# Patient Record
Sex: Female | Born: 1965 | Race: Black or African American | Hispanic: No | Marital: Single | Smoking: Never smoker
Health system: Southern US, Community
[De-identification: ages and names within clinical notes are randomized; demographics above are authoritative.]

## PROBLEM LIST (undated history)

## (undated) DIAGNOSIS — E785 Hyperlipidemia, unspecified: Secondary | ICD-10-CM

## (undated) DIAGNOSIS — I10 Essential (primary) hypertension: Secondary | ICD-10-CM

---

## 2010-10-21 ENCOUNTER — Emergency Department (HOSPITAL_COMMUNITY)
Admission: EM | Admit: 2010-10-21 | Discharge: 2010-10-21 | Payer: Self-pay | Source: Home / Self Care | Admitting: Emergency Medicine

## 2011-02-09 ENCOUNTER — Ambulatory Visit: Payer: Self-pay | Admitting: Family Medicine

## 2013-08-01 ENCOUNTER — Encounter (HOSPITAL_COMMUNITY): Payer: Self-pay | Admitting: Emergency Medicine

## 2013-08-01 DIAGNOSIS — Q619 Cystic kidney disease, unspecified: Secondary | ICD-10-CM

## 2013-08-01 DIAGNOSIS — R000 Tachycardia, unspecified: Secondary | ICD-10-CM | POA: Diagnosis present

## 2013-08-01 DIAGNOSIS — A419 Sepsis, unspecified organism: Secondary | ICD-10-CM | POA: Diagnosis present

## 2013-08-01 DIAGNOSIS — K5900 Constipation, unspecified: Secondary | ICD-10-CM | POA: Diagnosis present

## 2013-08-01 DIAGNOSIS — E860 Dehydration: Secondary | ICD-10-CM | POA: Diagnosis present

## 2013-08-01 DIAGNOSIS — N12 Tubulo-interstitial nephritis, not specified as acute or chronic: Secondary | ICD-10-CM | POA: Diagnosis present

## 2013-08-01 DIAGNOSIS — E876 Hypokalemia: Secondary | ICD-10-CM | POA: Diagnosis present

## 2013-08-01 DIAGNOSIS — N289 Disorder of kidney and ureter, unspecified: Secondary | ICD-10-CM | POA: Diagnosis present

## 2013-08-01 DIAGNOSIS — Z79899 Other long term (current) drug therapy: Secondary | ICD-10-CM

## 2013-08-01 DIAGNOSIS — A4151 Sepsis due to Escherichia coli [E. coli]: Principal | ICD-10-CM | POA: Diagnosis present

## 2013-08-01 NOTE — ED Notes (Signed)
Daughter reports generalized weakness / poor appetite onset yesterday , tachycardic at triage , denies pain / respirations unlabored.

## 2013-08-02 ENCOUNTER — Inpatient Hospital Stay (HOSPITAL_COMMUNITY)
Admission: EM | Admit: 2013-08-02 | Discharge: 2013-08-04 | DRG: 872 | Disposition: A | Discharge status: Home / Self Care | Payer: Self-pay | Attending: Internal Medicine | Admitting: Internal Medicine

## 2013-08-02 ENCOUNTER — Encounter (HOSPITAL_COMMUNITY): Payer: Self-pay | Admitting: Radiology

## 2013-08-02 ENCOUNTER — Inpatient Hospital Stay (HOSPITAL_COMMUNITY): Payer: Self-pay

## 2013-08-02 ENCOUNTER — Emergency Department (HOSPITAL_COMMUNITY): Payer: Self-pay

## 2013-08-02 DIAGNOSIS — A419 Sepsis, unspecified organism: Secondary | ICD-10-CM | POA: Diagnosis present

## 2013-08-02 DIAGNOSIS — R519 Headache, unspecified: Secondary | ICD-10-CM

## 2013-08-02 DIAGNOSIS — N12 Tubulo-interstitial nephritis, not specified as acute or chronic: Secondary | ICD-10-CM

## 2013-08-02 DIAGNOSIS — E876 Hypokalemia: Secondary | ICD-10-CM | POA: Diagnosis present

## 2013-08-02 DIAGNOSIS — R7881 Bacteremia: Secondary | ICD-10-CM | POA: Diagnosis present

## 2013-08-02 DIAGNOSIS — E860 Dehydration: Secondary | ICD-10-CM

## 2013-08-02 DIAGNOSIS — N390 Urinary tract infection, site not specified: Secondary | ICD-10-CM

## 2013-08-02 DIAGNOSIS — R51 Headache: Secondary | ICD-10-CM

## 2013-08-02 DIAGNOSIS — R410 Disorientation, unspecified: Secondary | ICD-10-CM

## 2013-08-02 HISTORY — DX: Hypokalemia: E87.6

## 2013-08-02 HISTORY — DX: Tubulo-interstitial nephritis, not specified as acute or chronic: N12

## 2013-08-02 LAB — COMPREHENSIVE METABOLIC PANEL
ALT: 24 U/L (ref 0–35)
AST: 28 U/L (ref 0–37)
Alkaline Phosphatase: 96 U/L (ref 39–117)
Calcium: 8.3 mg/dL — ABNORMAL LOW (ref 8.4–10.5)
GFR calc Af Amer: 67 mL/min — ABNORMAL LOW (ref >90)
Glucose, Bld: 141 mg/dL — ABNORMAL HIGH (ref 70–99)
Potassium: 2.9 mEq/L — ABNORMAL LOW (ref 3.5–5.1)
Sodium: 133 mEq/L — ABNORMAL LOW (ref 135–145)
Total Protein: 7.2 g/dL (ref 6.0–8.3)

## 2013-08-02 LAB — CBC WITH DIFFERENTIAL/PLATELET
Basophils Absolute: 0 10*3/uL (ref 0.0–0.1)
Eosinophils Absolute: 0 10*3/uL (ref 0.0–0.7)
Eosinophils Relative: 0 % (ref 0–5)
Hemoglobin: 11.4 g/dL — ABNORMAL LOW (ref 12.0–15.0)
Lymphocytes Relative: 4 % — ABNORMAL LOW (ref 12–46)
Lymphs Abs: 0.6 10*3/uL — ABNORMAL LOW (ref 0.7–4.0)
MCH: 27.5 pg (ref 26.0–34.0)
Monocytes Relative: 9 % (ref 3–12)
Neutrophils Relative %: 88 % — ABNORMAL HIGH (ref 43–77)
Platelets: 169 10*3/uL (ref 150–400)
RBC: 4.14 MIL/uL (ref 3.87–5.11)
WBC: 14.5 10*3/uL — ABNORMAL HIGH (ref 4.0–10.5)

## 2013-08-02 LAB — URINALYSIS, ROUTINE W REFLEX MICROSCOPIC
Glucose, UA: NEGATIVE mg/dL
Ketones, ur: 15 mg/dL — AB
Nitrite: NEGATIVE
Protein, ur: 100 mg/dL — AB
Specific Gravity, Urine: 1.008 (ref 1.005–1.030)
pH: 6 (ref 5.0–8.0)

## 2013-08-02 LAB — CG4 I-STAT (LACTIC ACID): Lactic Acid, Venous: 1.53 mmol/L (ref 0.5–2.2)

## 2013-08-02 LAB — PROTIME-INR: INR: 1.55 — ABNORMAL HIGH (ref 0.00–1.49)

## 2013-08-02 LAB — URINE MICROSCOPIC-ADD ON

## 2013-08-02 LAB — MAGNESIUM: Magnesium: 1.8 mg/dL (ref 1.5–2.5)

## 2013-08-02 LAB — POTASSIUM: Potassium: 4.4 mEq/L (ref 3.5–5.1)

## 2013-08-02 LAB — APTT: aPTT: 33 seconds (ref 24–37)

## 2013-08-02 LAB — CK: Total CK: 196 U/L — ABNORMAL HIGH (ref 7–177)

## 2013-08-02 MED ORDER — PNEUMOCOCCAL VAC POLYVALENT 25 MCG/0.5ML IJ INJ
0.5000 mL | INJECTION | INTRAMUSCULAR | Status: AC
  Administered 2013-08-03: 0.5 mL via INTRAMUSCULAR
  Filled 2013-08-02: qty 0.5

## 2013-08-02 MED ORDER — POTASSIUM CHLORIDE CRYS ER 20 MEQ PO TBCR
40.0000 meq | EXTENDED_RELEASE_TABLET | Freq: Once | ORAL | Status: AC
  Administered 2013-08-02: 40 meq via ORAL
  Filled 2013-08-02: qty 2

## 2013-08-02 MED ORDER — DEXTROSE 5 % IV SOLN: 2.0000 g | Freq: Once | INTRAVENOUS | Status: DC

## 2013-08-02 MED ORDER — POTASSIUM CHLORIDE IN NACL 20-0.9 MEQ/L-% IV SOLN
INTRAVENOUS | Status: AC
  Administered 2013-08-02: 10:00:00 via INTRAVENOUS
  Administered 2013-08-02: 100 mL/h via INTRAVENOUS
  Administered 2013-08-03: 05:00:00 via INTRAVENOUS
  Filled 2013-08-02 (×3): qty 1000

## 2013-08-02 MED ORDER — ONDANSETRON HCL 4 MG/2ML IJ SOLN: 4.0000 mg | Freq: Four times a day (QID) | INTRAMUSCULAR | Status: DC | PRN

## 2013-08-02 MED ORDER — DEXTROSE 5 % IV SOLN
1.0000 g | INTRAVENOUS | Status: DC
  Filled 2013-08-02: qty 10

## 2013-08-02 MED ORDER — ACETAMINOPHEN 650 MG RE SUPP: 650.0000 mg | Freq: Four times a day (QID) | RECTAL | Status: DC | PRN

## 2013-08-02 MED ORDER — ONDANSETRON HCL 4 MG/2ML IJ SOLN
4.0000 mg | Freq: Once | INTRAMUSCULAR | Status: AC
  Administered 2013-08-02: 4 mg via INTRAVENOUS
  Filled 2013-08-02: qty 2

## 2013-08-02 MED ORDER — MORPHINE SULFATE 2 MG/ML IJ SOLN
2.0000 mg | Freq: Once | INTRAMUSCULAR | Status: AC
  Administered 2013-08-02: 2 mg via INTRAVENOUS
  Filled 2013-08-02: qty 1

## 2013-08-02 MED ORDER — INFLUENZA VAC SPLIT QUAD 0.5 ML IM SUSP
0.5000 mL | INTRAMUSCULAR | Status: AC
  Administered 2013-08-03: 0.5 mL via INTRAMUSCULAR
  Filled 2013-08-02: qty 0.5

## 2013-08-02 MED ORDER — ONDANSETRON HCL 4 MG PO TABS: 4.0000 mg | ORAL_TABLET | Freq: Four times a day (QID) | ORAL | Status: DC | PRN

## 2013-08-02 MED ORDER — DOCUSATE SODIUM 100 MG PO CAPS
200.0000 mg | ORAL_CAPSULE | Freq: Two times a day (BID) | ORAL | Status: DC
  Administered 2013-08-02 – 2013-08-04 (×4): 200 mg via ORAL
  Filled 2013-08-02 (×5): qty 2

## 2013-08-02 MED ORDER — POTASSIUM CHLORIDE 10 MEQ/100ML IV SOLN
10.0000 meq | INTRAVENOUS | Status: AC
  Administered 2013-08-02 (×2): 10 meq via INTRAVENOUS
  Filled 2013-08-02 (×2): qty 100

## 2013-08-02 MED ORDER — BISACODYL 5 MG PO TBEC
10.0000 mg | DELAYED_RELEASE_TABLET | Freq: Once | ORAL | Status: AC
  Administered 2013-08-02: 10 mg via ORAL
  Filled 2013-08-02: qty 2

## 2013-08-02 MED ORDER — SODIUM CHLORIDE 0.9 % IJ SOLN
3.0000 mL | Freq: Two times a day (BID) | INTRAMUSCULAR | Status: DC
  Administered 2013-08-03: 3 mL via INTRAVENOUS

## 2013-08-02 MED ORDER — SODIUM CHLORIDE 0.9 % IV BOLUS (SEPSIS)
2000.0000 mL | Freq: Once | INTRAVENOUS | Status: AC
  Administered 2013-08-02: 2000 mL via INTRAVENOUS

## 2013-08-02 MED ORDER — AMITRIPTYLINE HCL 50 MG PO TABS
50.0000 mg | ORAL_TABLET | Freq: Every day | ORAL | Status: DC
  Administered 2013-08-02 – 2013-08-03 (×2): 50 mg via ORAL
  Filled 2013-08-02 (×3): qty 1

## 2013-08-02 MED ORDER — ACETAMINOPHEN 325 MG PO TABS
650.0000 mg | ORAL_TABLET | Freq: Four times a day (QID) | ORAL | Status: DC | PRN
  Administered 2013-08-02 – 2013-08-03 (×4): 650 mg via ORAL
  Filled 2013-08-02 (×5): qty 2

## 2013-08-02 MED ORDER — ENOXAPARIN SODIUM 40 MG/0.4ML ~~LOC~~ SOLN
40.0000 mg | SUBCUTANEOUS | Status: DC
  Administered 2013-08-02 – 2013-08-04 (×3): 40 mg via SUBCUTANEOUS
  Filled 2013-08-02 (×4): qty 0.4

## 2013-08-02 NOTE — Care Management Note (Signed)
    Page 1 of 1   08/04/2013     10:34:31 AM   CARE MANAGEMENT NOTE 08/04/2013  Patient:  Chelsey Wilson, Chelsey Wilson   Account Number:  [id number]  Date Initiated:  08/02/2013  Documentation initiated by:  GRAVES-BIGELOW,Fryda Molenda  Subjective/Objective Assessment:   Pt admitted for acute pyelonephritis.     Action/Plan:   CM will contine to monitor for disposition needs.   Anticipated DC Date:  08/03/2013   Anticipated DC Plan:  HOME/SELF CARE      DC Planning Services  CM consult  Follow-up appt scheduled  PCP issues      Choice offered to / List presented to:             Status of service:  Completed, signed off Medicare Important Message given?   (If response is "NO", the following Medicare IM given date fields will be blank) Date Medicare IM given:   Date Additional Medicare IM given:    Discharge Disposition:  HOME/SELF CARE  Per UR Regulation:  Reviewed for med. necessity/level of care/duration of stay  If discussed at Long Length of Stay Meetings, dates discussed:    Comments:  08-04-13 [address], RN, BSN [phone] CM did speak to pt and an appointment was made at the [facility] off of [address]. CM did also set her up with an orange card appointment. CM will look at med list before pt is d/c.

## 2013-08-02 NOTE — Progress Notes (Signed)
UR Completed Tuyet Bader Graves-Bigelow, RN,BSN [phone]  

## 2013-08-02 NOTE — H&P (Signed)
[facility] History and Physical  Chelsey Wilson FAO:[id number] DOB: 09/12/1957 DOA: 08/02/2013  Referring physician: ER physician. PCP: No primary provider on file.   Chief Complaint: Not feeling well.  HPI: Chelsey Wilson is a 47 y.o. female With no significant past medical history presented to the ER because patient was not feeling well. As per the patient patient has been having generalized body ache and poor appetite with headache. Patient felt nauseous but did not vomit. Did not have any abdominal pain diarrhea. Denies any chest pain or shortness of breath. In the ER patient initially was found to be toxic but greatly improved with fluids and antibiotics. UA shows features of UTI. Patient at this time has been admitted for IV antibiotics for possible pyelonephritis.  Patient denies having traveled anywhere outside [place] or has come in contact with anyone traveled from outside the [place].  Review of Systems: As presented in the history of presenting illness, rest negative.  History reviewed. No pertinent past medical history. History reviewed. No pertinent past surgical history. Social History:  reports that she has never smoked. She does not have any smokeless tobacco history on file. She reports that she does not drink alcohol or use illicit drugs. Home. where does patient live-- Yes. Can patient participate in ADLs?  No Known Allergies  Family History  Problem Relation Age of Onset  . Other Neg Hx       Prior to Admission medications   Medication Sig Start Date End Date Taking? Authorizing Provider  amitriptyline (ELAVIL) 50 MG tablet Take 50 mg by mouth at bedtime.   Yes Historical Provider, MD  OVER THE COUNTER MEDICATION Take 2 tablets by mouth every 8 (eight) hours as needed. Store brand sinus and head ache relief   Yes Historical Provider, MD   Physical Exam: Filed Vitals:   08/01/13 2319 08/02/13 0330  BP: 121/76 102/69  Pulse: 154 115  Temp: 99.9  F (37.7 C)   TempSrc: Oral   Resp: 20 21  SpO2: 94% 100%     General:  Well developed and nourished.  Eyes: anicteric no pallor.  ENT: no discharge from ears nose or mouth.  Neck: no neck rigidity. No mass felt.  Cardiovascular: S1S2  Respiratory: no rhonchi or crepitations.  Abdomen: soft non tender.  Skin: no rash.  Musculoskeletal: no edema.  Psychiatric: Appears normal  Neurologic: Alert awake and oriented to time place and person.   Labs on Admission:  Basic Metabolic Panel:  Recent Labs Lab 08/02/13 0315  NA 133*  K 2.9*  CL 98  CO2 23  GLUCOSE 141*  BUN 11  CREATININE 1.05  CALCIUM 8.3*  MG 1.8   Liver Function Tests:  Recent Labs Lab 08/02/13 0315  AST 28  ALT 24  ALKPHOS 96  BILITOT 1.0  PROT 7.2  ALBUMIN 3.0*   No results found for this basename: LIPASE, AMYLASE,  in the last 168 hours No results found for this basename: AMMONIA,  in the last 168 hours CBC:  Recent Labs Lab 08/02/13 0315  WBC 14.5*  NEUTROABS 12.7*  HGB 11.4*  HCT 32.3*  MCV 78.0  PLT 169   Cardiac Enzymes:  Recent Labs Lab 08/02/13 0315  CKTOTAL 196*    BNP (last 3 results) No results found for this basename: PROBNP,  in the last 8760 hours CBG: No results found for this basename: GLUCAP,  in the last 168 hours  Radiological Exams on Admission: Dg Chest 2 View  08/02/2013   *RADIOLOGY REPORT*  Clinical Data: Chest pain.  CHEST - 2 VIEW  Comparison: None available at time of study interpretation.  Findings: The cardiac silhouette is mildly enlarged, mediastinal silhouette is unremarkable.  Mild chronic interstitial changes without pleural effusions or focal consolidations.  3.5 mm nodular densities seen in right lung base on the frontal radiograph only. Pulmonary vasculature is unremarkable.  Trachea projects midline and there is no pneumothorax.  Very mild anterior wedging of vertebral body T8.  Soft tissue planes are not suspicious.  IMPRESSION: Mild  cardiomegaly and mild chronic interstitial changes without superimposed acute pulmonary process.  3.5 mm nodular density in right lung base could reflect a parenchymal nodule or pulmonary vessel en face.  If clinically indicated, this would be better assessed on CT of chest.   Original Report Authenticated By: Awilda Metro   Ct Head Wo Contrast  08/02/2013   *RADIOLOGY REPORT*  Clinical Data: Weakness, anorexia.  CT HEAD WITHOUT CONTRAST  Technique:  Contiguous axial images were obtained from the base of the skull through the vertex without contrast.  Comparison: None available at time of study interpretation.  Findings: The ventricles and sulci are normal.  No intraparenchymal hemorrhage, mass effect nor midline shift.  No acute large vascular territory infarcts.  No abnormal extra-axial fluid collections.  Basal cisterns are patent.  Mild calcific atherosclerosis of the carotid siphons.  No skull fracture.  Visualized paranasal sinuses and mastoid aircells are well-aerated. 9 mm right frontal sinus osteoma, benign finding.  Moderate temporomandibular osteoarthrosis.  The included ocular globes and orbital contents are non-suspicious.  IMPRESSION: No acute intracranial process; normal noncontrast CT of the head for age.   Original Report Authenticated By: Awilda Metro    Assessment/Plan Principal Problem:   Pyelonephritis Active Problems:   Hypokalemia   1. Pyelonephritis - follow urine cultures. Continue ceftriaxone. Continue hydration. Checking renal sonogram to rule out hydronephrosis. 2. Hypokalemia - probably from poor oral intake. Replace and recheck. Check magnesium levels.    Code Status: Full code.  Family Communication: None.  Disposition Plan: Admit to inpatient.    Izola Teague N. [facility] Pager [phone].  If 7PM-7AM, please contact night-coverage [email or web address] Password [email or web address] 08/02/2013, 7:03 AM

## 2013-08-02 NOTE — ED Notes (Signed)
Report given to Tina, RN.  

## 2013-08-02 NOTE — ED Provider Notes (Signed)
CSN: [id number]     Arrival date & time 08/01/13  2301 History   None    Chief Complaint  Patient presents with  . Fatigue   (Consider location/radiation/quality/duration/timing/severity/associated sxs/prior Treatment) HPI  This patient is a generally healthy [place] American woman who presents with 2d of fever, headache and generalized weakness. She is here with her daughters who also note that the patient seemed disoriented PTA. Patient has not checked her temp at home. She says her sx have been increasingly severe. She feels so weak, at this time, that she doesn't feel like she can get up and walk across the room.  However, she denies any focal neurologic deficits  Her appetite and po intake have been diminished. She denies cough, URI sx, SOB, abdominal pain, N/V/D and dysuria. Her daughter reports that the patient was incontinent of urine once - approx 6 hr pta.   The patient has no known ill contact and no recent international travel.    History reviewed. No pertinent past medical history. History reviewed. No pertinent past surgical history. No family history on file. History  Substance Use Topics  . Smoking status: Never Smoker   . Smokeless tobacco: Not on file  . Alcohol Use: No   OB History   Grav Para Term Preterm Abortions TAB SAB Ect Mult Living                 Review of Systems 10 point ROS obtained and negative except as noted above.   Allergies  Review of patient's allergies indicates no known allergies.  Home Medications   Current Outpatient Rx  Name  Route  Sig  Dispense  Refill  . amitriptyline (ELAVIL) 50 MG tablet   Oral   Take 50 mg by mouth at bedtime.         Marland Kitchen OVER THE COUNTER MEDICATION   Oral   Take 2 tablets by mouth every 8 (eight) hours as needed. Store brand sinus and head ache relief          BP 121/76  Pulse 154  Temp(Src) 99.9 F (37.7 C) (Oral)  Resp 20  SpO2 94% Physical Exam Gen: well developed and well nourished  appearing, appears acutely ill, diaphoretic, generally weak appearing.  Head: NCAT Eyes: PERL, EOMI Nose: no epistaixis or rhinorrhea Mouth/throat: mucosa is mildly dehydrated appearing and pink, no lesions Neck: supple, no stridor, no rigidity, no LAN Lungs: CTA B, no wheezing, rhonchi or rales, RR 24-28/min Abd: soft, notender, nondistended Back: no ttp, no cva ttp, mild kyphosis Skin: no rash or petechia, warm, mild diaphoresis Neuro: CN ii-xii grossly intact, no focal deficits Psyche; flat affect,  calm and cooperative.  MSK: nontender with normal passive ROM all 4 ext  ED Course  Procedures (including critical care time)  EKG: sinus tach, rate 157 bpm, non specific ST-T abnormalities.   Results for orders placed during the hospital encounter of 08/02/13 (from the past 24 hour(s))  CBC WITH DIFFERENTIAL     Status: Abnormal   Collection Time    08/02/13  3:15 AM      Result Value Range   WBC 14.5 (*) 4.0 - 10.5 K/uL   RBC 4.14  3.87 - 5.11 MIL/uL   Hemoglobin 11.4 (*) 12.0 - 15.0 g/dL   HCT 19.1 (*) 47.8 - 29.5 %   MCV 78.0  78.0 - 100.0 fL   MCH 27.5  26.0 - 34.0 pg   MCHC 35.3  30.0 - 36.0 g/dL  RDW 14.3  11.5 - 15.5 %   Platelets 169  150 - 400 K/uL   Neutrophils Relative % 88 (*) 43 - 77 %   Neutro Abs 12.7 (*) 1.7 - 7.7 K/uL   Lymphocytes Relative 4 (*) 12 - 46 %   Lymphs Abs 0.6 (*) 0.7 - 4.0 K/uL   Monocytes Relative 9  3 - 12 %   Monocytes Absolute 1.2 (*) 0.1 - 1.0 K/uL   Eosinophils Relative 0  0 - 5 %   Eosinophils Absolute 0.0  0.0 - 0.7 K/uL   Basophils Relative 0  0 - 1 %   Basophils Absolute 0.0  0.0 - 0.1 K/uL  COMPREHENSIVE METABOLIC PANEL     Status: Abnormal   Collection Time    08/02/13  3:15 AM      Result Value Range   Sodium 133 (*) 135 - 145 mEq/L   Potassium 2.9 (*) 3.5 - 5.1 mEq/L   Chloride 98  96 - 112 mEq/L   CO2 23  19 - 32 mEq/L   Glucose, Bld 141 (*) 70 - 99 mg/dL   BUN 11  6 - 23 mg/dL   Creatinine, Ser 8.29  0.50 - 1.10 mg/dL    Calcium 8.3 (*) 8.4 - 10.5 mg/dL   Total Protein 7.2  6.0 - 8.3 g/dL   Albumin 3.0 (*) 3.5 - 5.2 g/dL   AST 28  0 - 37 U/L   ALT 24  0 - 35 U/L   Alkaline Phosphatase 96  39 - 117 U/L   Total Bilirubin 1.0  0.3 - 1.2 mg/dL   GFR calc non Af Amer 58 (*) >90 mL/min   GFR calc Af Amer 67 (*) >90 mL/min  PROTIME-INR     Status: Abnormal   Collection Time    08/02/13  3:15 AM      Result Value Range   Prothrombin Time 18.2 (*) 11.6 - 15.2 seconds   INR 1.55 (*) 0.00 - 1.49  CK     Status: Abnormal   Collection Time    08/02/13  3:15 AM      Result Value Range   Total CK 196 (*) 7 - 177 U/L  APTT     Status: None   Collection Time    08/02/13  3:15 AM      Result Value Range   aPTT 33  24 - 37 seconds  MAGNESIUM     Status: None   Collection Time    08/02/13  3:15 AM      Result Value Range   Magnesium 1.8  1.5 - 2.5 mg/dL  URINALYSIS, ROUTINE W REFLEX MICROSCOPIC     Status: Abnormal   Collection Time    08/02/13  5:03 AM      Result Value Range   Color, Urine YELLOW  YELLOW   APPearance CLEAR  CLEAR   Specific Gravity, Urine 1.008  1.005 - 1.030   pH 6.0  5.0 - 8.0   Glucose, UA NEGATIVE  NEGATIVE mg/dL   Hgb urine dipstick MODERATE (*) NEGATIVE   Bilirubin Urine NEGATIVE  NEGATIVE   Ketones, ur 15 (*) NEGATIVE mg/dL   Protein, ur 562 (*) NEGATIVE mg/dL   Urobilinogen, UA 1.0  0.0 - 1.0 mg/dL   Nitrite NEGATIVE  NEGATIVE   Leukocytes, UA SMALL (*) NEGATIVE  URINE MICROSCOPIC-ADD ON     Status: Abnormal   Collection Time    08/02/13  5:03 AM  Result Value Range   Squamous Epithelial / LPF RARE  RARE   WBC, UA 11-20  <3 WBC/hpf   RBC / HPF 3-6  <3 RBC/hpf   Bacteria, UA RARE  RARE   Casts GRANULAR CAST (*) NEGATIVE   CRITICAL CARE Performed by: Brandt Loosen   Total critical care time: 63m  Critical care time was exclusive of separately billable procedures and treating other patients.  Critical care was necessary to treat or prevent imminent or  life-threatening deterioration.  Critical care was time spent personally by me on the following activities: development of treatment plan with patient and/or surrogate as well as nursing, discussions with consultants, evaluation of patient's response to treatment, examination of patient, obtaining history from patient or surrogate, ordering and performing treatments and interventions, ordering and review of laboratory studies, ordering and review of radiographic studies, pulse oximetry and re-evaluation of patient's condition.   MDM  DDX: viral syndrome, UTI, pneumonia, occult sepsis, meningitis,   We are fluid resuscitating. Labs, CXR, blood cultures pending. Anticipate admission.   0622: Patient looks and feels much better. Tachycardia has improved. Headache has resolved with IV hydration. CT brain and CXR are unremarkable for signs of acute process. WBC elevated, K low at 2.9 and U/A consistent with UTI. We are treating empirically with Ceftriaxone. Case discussed with Dr. Toniann Fail who will see and admit.      Brandt Loosen, MD 08/02/13 [phone]

## 2013-08-02 NOTE — ED Notes (Signed)
First liter of NS started.

## 2013-08-02 NOTE — Progress Notes (Signed)
Patient ID: Chelsey Wilson  female  ZOX:[id number]    DOB: 09/12/1957    DOA: 08/02/2013  PCP: No primary provider on file.  Assessment/Plan: Principal Problem:   Pyelonephritis - Continue Rocephin, renal ultrasound reviewed, 3.4 cm complex cystic lesion in the lower pole of the right kidney, renal abscess would be a consideration versus cystic neoplasm, MRI abdomen reported - Urine culture pending  Active Problems:   Hypokalemia - Patient has received potassium supplementation in ED, recheck potassium now  DVT Prophylaxis:  Code Status: Full code  Disposition:    Subjective:  denies any specific complaints, improving Objective: Weight change:   Intake/Output Summary (Last 24 hours) at 08/02/13 1339 Last data filed at 08/02/13 1300  Gross per 24 hour  Intake    360 ml  Output      0 ml  Net    360 ml   Blood pressure 113/69, pulse 103, temperature 98.8 F (37.1 C), temperature source Oral, resp. rate 20, weight 64.91 kg (143 lb 1.6 oz), SpO2 97.00%.  Physical Exam: General: Alert and awake, oriented x3, not in any acute distress. HEENT: anicteric sclera, PERLA, EOMI CVS: S1-S2 clear, no murmur rubs or gallops Chest: clear to auscultation bilaterally, no wheezing, rales or rhonchi Abdomen: soft nontender, nondistended, normal bowel sounds  Extremities: no cyanosis, clubbing or edema noted bilaterally Neuro: Cranial nerves II-XII intact, no focal neurological deficits  Lab Results: Basic Metabolic Panel:  Recent Labs Lab 08/02/13 0315  NA 133*  K 2.9*  CL 98  CO2 23  GLUCOSE 141*  BUN 11  CREATININE 1.05  CALCIUM 8.3*  MG 1.8   Liver Function Tests:  Recent Labs Lab 08/02/13 0315  AST 28  ALT 24  ALKPHOS 96  BILITOT 1.0  PROT 7.2  ALBUMIN 3.0*   No results found for this basename: LIPASE, AMYLASE,  in the last 168 hours No results found for this basename: AMMONIA,  in the last 168 hours CBC:  Recent Labs Lab 08/02/13 0315  WBC 14.5*   NEUTROABS 12.7*  HGB 11.4*  HCT 32.3*  MCV 78.0  PLT 169   Cardiac Enzymes:  Recent Labs Lab 08/02/13 0315  CKTOTAL 196*   BNP: No components found with this basename: POCBNP,  CBG: No results found for this basename: GLUCAP,  in the last 168 hours   Micro Results: No results found for this or any previous visit (from the past 240 hour(s)).  Studies/Results: Dg Chest 2 View  08/02/2013   *RADIOLOGY REPORT*  Clinical Data: Chest pain.  CHEST - 2 VIEW  Comparison: None available at time of study interpretation.  Findings: The cardiac silhouette is mildly enlarged, mediastinal silhouette is unremarkable.  Mild chronic interstitial changes without pleural effusions or focal consolidations.  3.5 mm nodular densities seen in right lung base on the frontal radiograph only. Pulmonary vasculature is unremarkable.  Trachea projects midline and there is no pneumothorax.  Very mild anterior wedging of vertebral body T8.  Soft tissue planes are not suspicious.  IMPRESSION: Mild cardiomegaly and mild chronic interstitial changes without superimposed acute pulmonary process.  3.5 mm nodular density in right lung base could reflect a parenchymal nodule or pulmonary vessel en face.  If clinically indicated, this would be better assessed on CT of chest.   Original Report Authenticated By: Awilda Metro   Ct Head Wo Contrast  08/02/2013   *RADIOLOGY REPORT*  Clinical Data: Weakness, anorexia.  CT HEAD WITHOUT CONTRAST  Technique:  Contiguous axial images were  obtained from the base of the skull through the vertex without contrast.  Comparison: None available at time of study interpretation.  Findings: The ventricles and sulci are normal.  No intraparenchymal hemorrhage, mass effect nor midline shift.  No acute large vascular territory infarcts.  No abnormal extra-axial fluid collections.  Basal cisterns are patent.  Mild calcific atherosclerosis of the carotid siphons.  No skull fracture.  Visualized  paranasal sinuses and mastoid aircells are well-aerated. 9 mm right frontal sinus osteoma, benign finding.  Moderate temporomandibular osteoarthrosis.  The included ocular globes and orbital contents are non-suspicious.  IMPRESSION: No acute intracranial process; normal noncontrast CT of the head for age.   Original Report Authenticated By: Awilda Metro   US Renal  08/02/2013   CLINICAL DATA:  Fever. Pyelonephritis.  EXAM: RENAL/URINARY TRACT ULTRASOUND COMPLETE  COMPARISON:  None.  FINDINGS: Right Kidney  Length: 11.6 cm. Cortical echogenicity is slightly increased relative to the background liver. There is no hydronephrosis. A complex cystic lesion measuring up to 3.4 cm is identified in the lower pole.  Left Kidney  Length: 10.8 cm. No evidence for hydronephrosis. No mass lesion.  Bladder:  Appears normal for degree of bladder distention.  IMPRESSION: No evidence for hydronephrosis.  3.4 cm complex cystic lesion in the lower pole of the right kidney. Given the history of pyelonephritis, renal abscess would be a consideration. Cystic neoplasm could also have these imaging features on ultrasound. MRI without and with contrast is recommended to further characterize.   Electronically Signed   By: Kennith Center M.D.   On: 08/02/2013 07:34    Medications: Scheduled Meds: . amitriptyline  50 mg Oral QHS  . [START ON 08/03/2013] cefTRIAXone (ROCEPHIN)  IV  1 g Intravenous Q24H  . cefTRIAXone (ROCEPHIN)  IV  2 g Intravenous Once  . enoxaparin (LOVENOX) injection  40 mg Subcutaneous Q24H  . sodium chloride  3 mL Intravenous Q12H      LOS: 0 days   Yosiel Thieme M.D. [facility] 08/02/2013, 1:39 PM Pager: [phone]  If 7PM-7AM, please contact night-coverage [email or web address] Password [email or web address]

## 2013-08-03 ENCOUNTER — Inpatient Hospital Stay (HOSPITAL_COMMUNITY): Payer: Self-pay

## 2013-08-03 DIAGNOSIS — A419 Sepsis, unspecified organism: Secondary | ICD-10-CM

## 2013-08-03 DIAGNOSIS — R7881 Bacteremia: Secondary | ICD-10-CM | POA: Diagnosis present

## 2013-08-03 DIAGNOSIS — R404 Transient alteration of awareness: Secondary | ICD-10-CM

## 2013-08-03 HISTORY — DX: Bacteremia: R78.81

## 2013-08-03 HISTORY — DX: Sepsis, unspecified organism: A41.9

## 2013-08-03 LAB — BASIC METABOLIC PANEL WITH GFR
BUN: 6 mg/dL (ref 6–23)
CO2: 19 meq/L (ref 19–32)
Calcium: 8.6 mg/dL (ref 8.4–10.5)
Chloride: 105 meq/L (ref 96–112)
Creatinine, Ser: 0.74 mg/dL (ref 0.50–1.10)
GFR calc Af Amer: >90 mL/min
GFR calc non Af Amer: >90 mL/min
Glucose, Bld: 78 mg/dL (ref 70–99)
Potassium: 4 meq/L (ref 3.5–5.1)
Sodium: 136 meq/L (ref 135–145)

## 2013-08-03 LAB — URINE CULTURE: Colony Count: >=100000

## 2013-08-03 LAB — CBC
HCT: 30 % — ABNORMAL LOW (ref 36.0–46.0)
Hemoglobin: 10.6 g/dL — ABNORMAL LOW (ref 12.0–15.0)
MCH: 27.4 pg (ref 26.0–34.0)
MCHC: 35.3 g/dL (ref 30.0–36.0)
MCV: 77.5 fL — ABNORMAL LOW (ref 78.0–100.0)
Platelets: 183 10*3/uL (ref 150–400)
RBC: 3.87 MIL/uL (ref 3.87–5.11)
RDW: 14.7 % (ref 11.5–15.5)
WBC: 8.6 10*3/uL (ref 4.0–10.5)

## 2013-08-03 MED ORDER — GADOBENATE DIMEGLUMINE 529 MG/ML IV SOLN
15.0000 mL | Freq: Once | INTRAVENOUS | Status: AC
  Administered 2013-08-03: 13 mL via INTRAVENOUS

## 2013-08-03 MED ORDER — DEXTROSE 5 % IV SOLN
2.0000 g | Freq: Every day | INTRAVENOUS | Status: DC
  Administered 2013-08-03 (×2): 2 g via INTRAVENOUS
  Filled 2013-08-03 (×3): qty 2

## 2013-08-03 MED ORDER — HYDROCODONE-ACETAMINOPHEN 5-325 MG PO TABS
1.0000 | ORAL_TABLET | Freq: Once | ORAL | Status: AC
  Administered 2013-08-03: 1 via ORAL
  Filled 2013-08-03: qty 1

## 2013-08-03 NOTE — Progress Notes (Signed)
Temp 101.3, Tylenol PO given per MD order.  Merdis Delay, NP notified.  Will continue to monitor.

## 2013-08-03 NOTE — Progress Notes (Signed)
CRITICAL VALUE ALERT  Critical value received:  B/C x2 aerobic: gram negative rods  Date of notification:  08/03/2013  Time of notification:  00:31  Critical value read back: yes  Nurse who received alert:  Sharen Heck  MD notified (1st page):  Merdis Delay, NP  Time of first page:  12:51  MD notified (2nd page):  Time of second page:  Responding MD:    Time MD responded:

## 2013-08-03 NOTE — Progress Notes (Signed)
Patient ID: Chelsey Wilson  female  ZOX:[id number]    DOB: 09/12/1957    DOA: 08/02/2013  PCP: No primary provider on file.  Assessment/Plan: Principal Problem:   Pyelonephritis with gram-negative rod bacteremia  - 2/ 2 blood cultures positive for gram-negative rods, urine culture positive for > 100,000 colonies of gram-negative rods: -  Continue Rocephin, will adjust antibiotics according to sensitivities of the blood cultures and urine cultures - renal ultrasound reviewed, 3.4 cm complex cystic lesion in the lower pole of the right kidney -  MRI of the abdomen was obtained which showed cystic right renal lesion with pyelonephritis but no renal abscess or neoplasm.   Active Problems:   Hypokalemia -  resolved   DVT Prophylaxis:  Code Status: Full code  Disposition:Hopefully  tomorrow if the culture results are available     Subjective:  This morning, denies any specific complaints, sister at the bedside. Febrile overnight, blood cultures positive for gram-negative rods  Objective: Weight change:   Intake/Output Summary (Last 24 hours) at 08/03/13 1256 Last data filed at 08/03/13 0700  Gross per 24 hour  Intake 2678.33 ml  Output      1 ml  Net 2677.33 ml   Blood pressure 141/77, pulse 108, temperature 101.3 F (38.5 C), temperature source Oral, resp. rate 18, height 5\' 4"  (1.626 m), weight 64.91 kg (143 lb 1.6 oz), SpO2 100.00%.  Physical Exam: General: Alert and awake, oriented x3, not in any acute distress. CVS: S1-S2 clear, no murmur rubs or gallops Chest: clear to auscultation bilaterally, no wheezing, rales or rhonchi Abdomen: soft nontender, nondistended, normal bowel sounds  Extremities: no cyanosis, clubbing or edema noted bilaterally   Lab Results: Basic Metabolic Panel:  Recent Labs Lab 08/02/13 0315 08/02/13 1345 08/03/13 0440  NA 133*  --  136  K 2.9* 4.4 4.0  CL 98  --  105  CO2 23  --  19  GLUCOSE 141*  --  78  BUN 11  --  6  CREATININE 1.05   --  0.74  CALCIUM 8.3*  --  8.6  MG 1.8  --   --    Liver Function Tests:  Recent Labs Lab 08/02/13 0315  AST 28  ALT 24  ALKPHOS 96  BILITOT 1.0  PROT 7.2  ALBUMIN 3.0*   No results found for this basename: LIPASE, AMYLASE,  in the last 168 hours No results found for this basename: AMMONIA,  in the last 168 hours CBC:  Recent Labs Lab 08/02/13 0315 08/03/13 0440  WBC 14.5* 8.6  NEUTROABS 12.7*  --   HGB 11.4* 10.6*  HCT 32.3* 30.0*  MCV 78.0 77.5*  PLT 169 183   Cardiac Enzymes:  Recent Labs Lab 08/02/13 0315  CKTOTAL 196*   BNP: No components found with this basename: POCBNP,  CBG: No results found for this basename: GLUCAP,  in the last 168 hours   Micro Results: Recent Results (from the past 240 hour(s))  CULTURE, BLOOD (ROUTINE X 2)     Status: None   Collection Time    08/02/13  3:15 AM      Result Value Range Status   Specimen Description BLOOD LEFT ARM   Final   Special Requests BOTTLES DRAWN AEROBIC ONLY [facility]   Final   Culture  Setup Time     Final   Value: 08/02/2013 08:52     Performed at [facility]   Culture     Final   Value:  GRAM NEGATIVE RODS     Note: Gram Stain Report Called to,Read Back By and Verified With: REBECCA SPARKS ON 08/03/2013 AT 12:31A BY WILEJ     Performed at [facility]   Report Status PENDING   Incomplete  CULTURE, BLOOD (ROUTINE X 2)     Status: None   Collection Time    08/02/13  3:25 AM      Result Value Range Status   Specimen Description BLOOD LEFT HAND   Final   Special Requests BOTTLES DRAWN AEROBIC ONLY [facility]   Final   Culture  Setup Time     Final   Value: 08/02/2013 08:52     Performed at [facility]   Culture     Final   Value: GRAM NEGATIVE RODS     Note: Gram Stain Report Called to,Read Back By and Verified With: REBECCA SPARKS ON 08/03/2013 AT 12:31A BY WILEJ     Performed at [facility]   Report Status PENDING   Incomplete  URINE CULTURE     Status: None    Collection Time    08/02/13  5:03 AM      Result Value Range Status   Specimen Description URINE, CLEAN CATCH   Final   Special Requests NONE   Final   Culture  Setup Time     Final   Value: 08/02/2013 08:53     Performed at [facility] Count     Final   Value: >=100,000 COLONIES/ML     Performed at [facility]   Culture     Final   Value: GRAM NEGATIVE RODS     Performed at [facility]   Report Status PENDING   Incomplete    Studies/Results: Dg Chest 2 View  08/02/2013   *RADIOLOGY REPORT*  Clinical Data: Chest pain.  CHEST - 2 VIEW  Comparison: None available at time of study interpretation.  Findings: The cardiac silhouette is mildly enlarged, mediastinal silhouette is unremarkable.  Mild chronic interstitial changes without pleural effusions or focal consolidations.  3.5 mm nodular densities seen in right lung base on the frontal radiograph only. Pulmonary vasculature is unremarkable.  Trachea projects midline and there is no pneumothorax.  Very mild anterior wedging of vertebral body T8.  Soft tissue planes are not suspicious.  IMPRESSION: Mild cardiomegaly and mild chronic interstitial changes without superimposed acute pulmonary process.  3.5 mm nodular density in right lung base could reflect a parenchymal nodule or pulmonary vessel en face.  If clinically indicated, this would be better assessed on CT of chest.   Original Report Authenticated By: Awilda Metro   Ct Head Wo Contrast  08/02/2013   *RADIOLOGY REPORT*  Clinical Data: Weakness, anorexia.  CT HEAD WITHOUT CONTRAST  Technique:  Contiguous axial images were obtained from the base of the skull through the vertex without contrast.  Comparison: None available at time of study interpretation.  Findings: The ventricles and sulci are normal.  No intraparenchymal hemorrhage, mass effect nor midline shift.  No acute large vascular territory infarcts.  No abnormal extra-axial fluid collections.   Basal cisterns are patent.  Mild calcific atherosclerosis of the carotid siphons.  No skull fracture.  Visualized paranasal sinuses and mastoid aircells are well-aerated. 9 mm right frontal sinus osteoma, benign finding.  Moderate temporomandibular osteoarthrosis.  The included ocular globes and orbital contents are non-suspicious.  IMPRESSION: No acute intracranial process; normal noncontrast CT of the head for age.  Original Report Authenticated By: Awilda Metro   US Renal  08/02/2013   CLINICAL DATA:  Fever. Pyelonephritis.  EXAM: RENAL/URINARY TRACT ULTRASOUND COMPLETE  COMPARISON:  None.  FINDINGS: Right Kidney  Length: 11.6 cm. Cortical echogenicity is slightly increased relative to the background liver. There is no hydronephrosis. A complex cystic lesion measuring up to 3.4 cm is identified in the lower pole.  Left Kidney  Length: 10.8 cm. No evidence for hydronephrosis. No mass lesion.  Bladder:  Appears normal for degree of bladder distention.  IMPRESSION: No evidence for hydronephrosis.  3.4 cm complex cystic lesion in the lower pole of the right kidney. Given the history of pyelonephritis, renal abscess would be a consideration. Cystic neoplasm could also have these imaging features on ultrasound. MRI without and with contrast is recommended to further characterize.   Electronically Signed   By: Kennith Center M.D.   On: 08/02/2013 07:34    Medications: Scheduled Meds: . amitriptyline  50 mg Oral QHS  . cefTRIAXone (ROCEPHIN)  IV  2 g Intravenous Once  . cefTRIAXone (ROCEPHIN)  IV  2 g Intravenous QHS  . docusate sodium  200 mg Oral BID  . enoxaparin (LOVENOX) injection  40 mg Subcutaneous Q24H  . sodium chloride  3 mL Intravenous Q12H      LOS: 1 day   RAI,RIPUDEEP M.D. [facility] 08/03/2013, 12:56 PM Pager: [phone]  If 7PM-7AM, please contact night-coverage [email or web address] Password [email or web address]

## 2013-08-03 NOTE — Progress Notes (Signed)
Patient complain of constipation.  Patient stated last BM was Sunday, 10/5.  MD notified.

## 2013-08-04 LAB — BASIC METABOLIC PANEL
Calcium: 9.3 mg/dL (ref 8.4–10.5)
Creatinine, Ser: 0.73 mg/dL (ref 0.50–1.10)
GFR calc non Af Amer: >90 mL/min (ref >90)
Glucose, Bld: 113 mg/dL — ABNORMAL HIGH (ref 70–99)
Sodium: 135 mEq/L (ref 135–145)

## 2013-08-04 LAB — CBC
HCT: 32.1 % — ABNORMAL LOW (ref 36.0–46.0)
Hemoglobin: 11.6 g/dL — ABNORMAL LOW (ref 12.0–15.0)
MCH: 27.6 pg (ref 26.0–34.0)
MCHC: 36.1 g/dL — ABNORMAL HIGH (ref 30.0–36.0)
Platelets: 222 10*3/uL (ref 150–400)

## 2013-08-04 LAB — CULTURE, BLOOD (ROUTINE X 2)

## 2013-08-04 MED ORDER — AMITRIPTYLINE HCL 50 MG PO TABS: 50.0000 mg | ORAL_TABLET | Freq: Every day | ORAL | Status: DC

## 2013-08-04 MED ORDER — HYDROCODONE-ACETAMINOPHEN 5-325 MG PO TABS: 1.0000 | ORAL_TABLET | Freq: Three times a day (TID) | ORAL | Status: DC | PRN

## 2013-08-04 MED ORDER — PROMETHAZINE HCL 12.5 MG PO TABS: 12.5000 mg | ORAL_TABLET | Freq: Four times a day (QID) | ORAL | Status: DC | PRN

## 2013-08-04 MED ORDER — CIPROFLOXACIN HCL 500 MG PO TABS: 500.0000 mg | ORAL_TABLET | Freq: Two times a day (BID) | ORAL | Status: DC

## 2013-08-04 MED ORDER — DSS 100 MG PO CAPS: 100.0000 mg | ORAL_CAPSULE | Freq: Two times a day (BID) | ORAL | Status: DC | PRN

## 2013-08-04 NOTE — Discharge Summary (Signed)
Physician Discharge Summary  Patient ID: Chelsey Wilson MRN: [id number] DOB/AGE: 48/18/1958 48 y.o.  Admit date: 08/02/2013 Discharge date: 08/04/2013  Primary Care Physician:  No primary provider on file.  Discharge Diagnoses:       Escherichia coli sepsis/bacteremia with UTI . Pyelonephritis . Hypokalemia resolved  Complex renal cyst  Consults: None   Recommendations for Outpatient Follow-up:  1. patient was found to have a cystic right renal lesion with peripheral complex tachycardia, likely pyelonephritis with superinfection of a right renal cyst. Patient is also recommended MRI abdomen at 6 months for followup. 2. another set of blood cultures were obtained prior to DC, please follow-up to ensure complete clearance of Escherichia coli sepsis likely from Escherichia coli UTI/pyelonephritis  Allergies:  No Known Allergies   Discharge Medications:   Medication List         amitriptyline 50 MG tablet  Commonly known as:  ELAVIL  Take 1 tablet (50 mg total) by mouth at bedtime.     ciprofloxacin 500 MG tablet  Commonly known as:  CIPRO  Take 1 tablet (500 mg total) by mouth 2 (two) times daily. X 2 weeks     DSS 100 MG Caps  Take 100 mg by mouth 2 (two) times daily as needed for constipation.     HYDROcodone-acetaminophen 5-325 MG per tablet  Commonly known as:  NORCO  Take 1 tablet by mouth every 8 (eight) hours as needed for pain.     OVER THE COUNTER MEDICATION  Take 2 tablets by mouth every 8 (eight) hours as needed. Store brand sinus and head ache relief     promethazine 12.5 MG tablet  Commonly known as:  PHENERGAN  Take 1 tablet (12.5 mg total) by mouth every 6 (six) hours as needed for nausea.         Brief H and P: For complete details please refer to admission H and P, but in brief patient is a 47 year old female with no significant past medical history presented to ER as she was not feeling well. Per patient she was having generalized body aches,  poor appetite with headache. She also felt nauseous but no abdominal pain or diarrhea. In the ER patient was found to be sick with the tachycardia, fever  Which improved with fluids and antibiotics. UA showed UTI. She was admitted for further workup and IV antibiotics. Patient otherwise denied any travel outside of [place].    Hospital Course:   Pyelonephritis with Escherichia coli sepsis, Escherichia coli UTI - 2/ 2 blood cultures positive for Escherichia coli, urine culture also positive for Escherichia coli, sensitive to Rocephin and ciprofloxacin. Patient was placed on 2g IV Rocephin inpatient while awaiting for the sensitivities. She has improved significantly and currently back to her baseline. Patient will be DC'd on ciprofloxacin 500 mg BID for 2 weeks. Repeat blood cultures were obtained prior to DC which [facility] to ensure resolution of the Escherichia coli sepsis.  Complex renal cyst Renal ultrasound was obtained which showed 3.4 cm complex cystic lesion in the lower pole of the right kidney. Due to concern for Renal Abscess or neoplasm, MRI of the abdomen was obtained which showed cystic right renal lesion with pyelonephritis but no renal abscess or neoplasm. Patient should have a repeat MRI of the abdomen in 6 months  Hypokalemia - resolved   Day of Discharge BP 131/88  Pulse 105  Temp(Src) 99.5 F (37.5 C) (Oral)  Resp 18  Ht 5\' 4"  (1.626 m)  Wt 63.821 kg (140 lb 11.2 oz)  BMI 24.14 kg/m2  SpO2 100%  Physical Exam: General: Alert and awake oriented x3 not in any acute distress. CVS: S1-S2 clear no murmur rubs or gallops Chest: clear to auscultation bilaterally, no wheezing rales or rhonchi Abdomen: soft nontender, nondistended, normal bowel sounds Extremities: no cyanosis, clubbing or edema noted bilaterally Neuro: Cranial nerves II-XII intact, no focal neurological deficits   The results of significant  diagnostics from this hospitalization (including imaging, microbiology, ancillary and laboratory) are listed below for reference.    LAB RESULTS: Basic Metabolic Panel:  Recent Labs Lab 08/02/13 0315  08/03/13 0440 08/04/13 0635  NA 133*  --  136 135  K 2.9*  < > 4.0 4.0  CL 98  --  105 100  CO2 23  --  19 23  GLUCOSE 141*  --  78 113*  BUN 11  --  6 5*  CREATININE 1.05  --  0.74 0.73  CALCIUM 8.3*  --  8.6 9.3  MG 1.8  --   --   --   < > = values in this interval not displayed. Liver Function Tests:  Recent Labs Lab 08/02/13 0315  AST 28  ALT 24  ALKPHOS 96  BILITOT 1.0  PROT 7.2  ALBUMIN 3.0*   No results found for this basename: LIPASE, AMYLASE,  in the last 168 hours No results found for this basename: AMMONIA,  in the last 168 hours CBC:  Recent Labs Lab 08/02/13 0315 08/03/13 0440 08/04/13 0635  WBC 14.5* 8.6 7.0  NEUTROABS 12.7*  --   --   HGB 11.4* 10.6* 11.6*  HCT 32.3* 30.0* 32.1*  MCV 78.0 77.5* 76.4*  PLT 169 183 222   Cardiac Enzymes:  Recent Labs Lab 08/02/13 0315  CKTOTAL 196*   BNP: No components found with this basename: POCBNP,  CBG: No results found for this basename: GLUCAP,  in the last 168 hours  Significant Diagnostic Studies:  Dg Chest 2 View  08/02/2013   *RADIOLOGY REPORT*  Clinical Data: Chest pain.  CHEST - 2 VIEW  Comparison: None available at time of study interpretation.  Findings: The cardiac silhouette is mildly enlarged, mediastinal silhouette is unremarkable.  Mild chronic interstitial changes without pleural effusions or focal consolidations.  3.5 mm nodular densities seen in right lung base on the frontal radiograph only. Pulmonary vasculature is unremarkable.  Trachea projects midline and there is no pneumothorax.  Very mild anterior wedging of vertebral body T8.  Soft tissue planes are not suspicious.  IMPRESSION: Mild cardiomegaly and mild chronic interstitial changes without superimposed acute pulmonary process.   3.5 mm nodular density in right lung base could reflect a parenchymal nodule or pulmonary vessel en face.  If clinically indicated, this would be better assessed on CT of chest.   Original Report Authenticated By: Awilda Metro   Ct Head Wo Contrast  08/02/2013   *RADIOLOGY REPORT*  Clinical Data: Weakness, anorexia.  CT HEAD WITHOUT CONTRAST  Technique:  Contiguous axial images were obtained from the base of the skull through the vertex without contrast.  Comparison: None available at time of study interpretation.  Findings: The ventricles and sulci are normal.  No intraparenchymal hemorrhage, mass effect nor midline shift.  No acute large vascular territory infarcts.  No abnormal extra-axial fluid collections.  Basal cisterns are patent.  Mild calcific atherosclerosis of the carotid siphons.  No skull fracture.  Visualized paranasal sinuses and  mastoid aircells are well-aerated. 9 mm right frontal sinus osteoma, benign finding.  Moderate temporomandibular osteoarthrosis.  The included ocular globes and orbital contents are non-suspicious.  IMPRESSION: No acute intracranial process; normal noncontrast CT of the head for age.   Original Report Authenticated By: Awilda Metro   Mr Abdomen W Wo Contrast  08/03/2013   CLINICAL DATA:  Followup of renal lesion seen on ultrasound. Possible abscess.  EXAM: MRI ABDOMEN WITH AND WITHOUT CONTRAST  TECHNIQUE: Multiplanar multisequence MR imaging of the abdomen was performed both before and after administration of intravenous contrast.  CONTRAST:  13mL MULTIHANCE GADOBENATE DIMEGLUMINE 529 MG/ML IV SOLN  COMPARISON:  Ultrasound of 08/02/2013  FINDINGS: Bibasilar atelectasis. A small hiatal hernia.  Minimally complex cyst in the left lobe of the liver at 1.0 cm.  Normal spleen, distal stomach, pancreas, gallbladder, biliary tract, adrenal glands. No abdominal adenopathy or ascites. Tiny interpolar left renal lesions which are likely cysts.  Portions of exam are  minimally motion degraded.  Corresponding to the ultrasound abnormality, within the lower pole right kidney laterally is a 2.7 x 2.7 by 2.2 cm lesion. Example image 21/ series 6 and image 9/series 4. This demonstrates complexity, as evidenced by peripheral and dependent T2 hypointensity on image 21/ series 6. Wall irregularity, including on image 76/ series 09811. Peripheral restricted diffusion on image 15/ series 800. The central portion of this lesion is simple. Post-contrast images demonstrate no nodular or central enhancement.  Mild right perirenal edema is identified, including on image 29/ series 7.  IMPRESSION: 1. Cystic right renal lesion which demonstrates peripheral complexity. Concurrent right perirenal edema. Findings are favored to represent pyelonephritis with superinfection of a right renal cyst. Renal abscess is felt less likely, given absence of significant surrounding renal parenchymal edema. Cystic neoplasm is felt much less likely but cannot be excluded. Recommend appropriate antibiotic therapy and followup with pre and post contrast MRI at 6 months. 2. Small hiatal hernia.   Electronically Signed   By: Jeronimo Greaves M.D.   On: 08/03/2013 08:37   US Renal  08/02/2013   CLINICAL DATA:  Fever. Pyelonephritis.  EXAM: RENAL/URINARY TRACT ULTRASOUND COMPLETE  COMPARISON:  None.  FINDINGS: Right Kidney  Length: 11.6 cm. Cortical echogenicity is slightly increased relative to the background liver. There is no hydronephrosis. A complex cystic lesion measuring up to 3.4 cm is identified in the lower pole.  Left Kidney  Length: 10.8 cm. No evidence for hydronephrosis. No mass lesion.  Bladder:  Appears normal for degree of bladder distention.  IMPRESSION: No evidence for hydronephrosis.  3.4 cm complex cystic lesion in the lower pole of the right kidney. Given the history of pyelonephritis, renal abscess would be a consideration. Cystic neoplasm could also have these imaging features on ultrasound. MRI  without and with contrast is recommended to further characterize.   Electronically Signed   By: Kennith Center M.D.   On: 08/02/2013 07:34      Disposition and Follow-up:     Discharge Orders   Future Appointments Provider Department Dept Phone   08/15/2013 2:00 PM [facility] Financial Counselor [facility] [phone]   08/17/2013 10:30 AM [facility] Covering Provider  [facility] [phone]   Future Orders Complete By Expires   Diet - low sodium heart healthy  As directed    Increase activity slowly  As directed        DISPOSITION: home  DIET: heart healthy  ACTIVITY: as tolerated  TESTS THAT NEED FOLLOW-UP Blood cultures done prior to DC  DISCHARGE FOLLOW-UP Follow-up Information   Follow up with [facility] On 08/17/2013. (@ 10:30 for hospital f/u. Please bring d/c summary from  hospital stay. 20.00 co pay at the time of visit. )    Contact information:   [address] [place] [address] [phone]      Follow up with [facility]     On 08/15/2013. (@ 2:00 pm for orange card (Medication Assistance Card) appointment. Please take the orange card eligiblity form/ all items on the forms. )    Contact information:   [address] [place] [address]       Time spent on Discharge: 40 mins  Signed:   Egbert Seidel M.D. [facility] 08/04/2013, 11:28 AM Pager: [phone]

## 2013-08-10 LAB — CULTURE, BLOOD (ROUTINE X 2)
Culture: NO GROWTH
Culture: NO GROWTH

## 2013-08-15 ENCOUNTER — Ambulatory Visit: Payer: Self-pay | Attending: Internal Medicine

## 2013-08-17 ENCOUNTER — Ambulatory Visit: Payer: Self-pay | Attending: Internal Medicine

## 2013-08-17 ENCOUNTER — Ambulatory Visit: Payer: Self-pay | Attending: Internal Medicine | Admitting: Internal Medicine

## 2013-08-17 VITALS — BP 125/79 | HR 63 | Temp 98.3°F | Resp 16

## 2013-08-17 DIAGNOSIS — N10 Acute tubulo-interstitial nephritis: Secondary | ICD-10-CM | POA: Insufficient documentation

## 2013-08-17 DIAGNOSIS — N12 Tubulo-interstitial nephritis, not specified as acute or chronic: Secondary | ICD-10-CM

## 2013-08-17 LAB — BASIC METABOLIC PANEL
BUN: 11 mg/dL (ref 6–23)
CO2: 28 mEq/L (ref 19–32)
Calcium: 9.4 mg/dL (ref 8.4–10.5)
Chloride: 105 mEq/L (ref 96–112)
Glucose, Bld: 95 mg/dL (ref 70–99)
Potassium: 4.4 mEq/L (ref 3.5–5.3)
Sodium: 136 mEq/L (ref 135–145)

## 2013-08-17 MED ORDER — HYDROCODONE-ACETAMINOPHEN 5-325 MG PO TABS: 1.0000 | ORAL_TABLET | Freq: Three times a day (TID) | ORAL | Status: DC | PRN

## 2013-08-17 MED ORDER — AMITRIPTYLINE HCL 50 MG PO TABS: 50.0000 mg | ORAL_TABLET | Freq: Every day | ORAL | Status: DC

## 2013-08-17 MED ORDER — PROMETHAZINE HCL 12.5 MG PO TABS: 12.5000 mg | ORAL_TABLET | Freq: Four times a day (QID) | ORAL | Status: DC | PRN

## 2013-08-17 NOTE — Progress Notes (Signed)
Patient here for hospital follow up States she was admitted for an infection in her blood

## 2013-08-17 NOTE — Progress Notes (Signed)
Patient ID: Chelsey Wilson, female   DOB: 09/12/1957, 47 y.o.   MRN: [id number]   CC: follow up   HPI: Patient is 47 year old female who presents to clinic for followup on recent hospitalization for acute pyelonephritis. Patient reports feeling better but still feels bilateral flank pain on certain occasions. She describes pain is intermittent and throbbing, 3/10 in severity when present, nonradiating, no specific alleviating or aggravating factors. Her appetite is much better and she is able to tolerate by mouth intake. She denies fevers and chills, no specific urinary concerns. She is still taking antibiotics and has several more days to complete therapy. She needs refill on antiemetic.  No Known Allergies No past medical history  Current Outpatient Prescriptions on File Prior to Visit  Medication Sig Dispense Refill  . ciprofloxacin (CIPRO) 500 MG tablet Take 1 tablet (500 mg total) by mouth 2 (two) times daily. X 2 weeks  28 tablet  0  . docusate sodium 100 MG CAPS Take 100 mg by mouth 2 (two) times daily as needed for constipation.  60 capsule  0  . OVER THE COUNTER MEDICATION Take 2 tablets by mouth every 8 (eight) hours as needed. Store brand sinus and head ache relief       No current facility-administered medications on file prior to visit.   Family History  Problem Relation Age of Onset  . Other Neg Hx    History   Social History  . Marital Status: Single    Spouse Name: N/A    Number of Children: N/A  . Years of Education: N/A   Occupational History  . Not on file.   Social History Main Topics  . Smoking status: Never Smoker   . Smokeless tobacco: Never Used  . Alcohol Use: No  . Drug Use: No  . Sexual Activity: No   Other Topics Concern  . Not on file   Social History Narrative  . No narrative on file    Review of Systems  Constitutional: Negative for fever, chills, diaphoresis, activity change, appetite change and fatigue.  HENT: Negative for ear pain,  nosebleeds, congestion, facial swelling, rhinorrhea, neck pain, neck stiffness and ear discharge.   Eyes: Negative for pain, discharge, redness, itching and visual disturbance.  Respiratory: Negative for cough, choking, chest tightness, shortness of breath, wheezing and stridor.   Cardiovascular: Negative for chest pain, palpitations and leg swelling.  Gastrointestinal: Negative for abdominal distention.  Genitourinary: Negative for dysuria, urgency, frequency, hematuria, decreased urine volume, difficulty urinating and dyspareunia.  Musculoskeletal: Negative for back pain, joint swelling, arthralgias and gait problem.  Neurological: Negative for dizziness, tremors, seizures, syncope, facial asymmetry, speech difficulty, weakness, light-headedness, numbness and headaches.  Hematological: Negative for adenopathy. Does not bruise/bleed easily.  Psychiatric/Behavioral: Negative for hallucinations, behavioral problems, confusion, dysphoric mood, decreased concentration and agitation.    Objective:   Filed Vitals:   08/17/13 1038  BP: 125/79  Pulse: 63  Temp: 98.3 F (36.8 C)  Resp: 16    Physical Exam  Constitutional: Appears well-developed and well-nourished. No distress.  CVS: RRR, S1/S2 +, no murmurs, no gallops, no carotid bruit.  Pulmonary: Effort and breath sounds normal, no stridor, rhonchi, wheezes, rales.  Abdominal: Soft. BS +,  no distension, tenderness, rebound or guarding.  Musculoskeletal: Normal range of motion. No edema and no tenderness.    Lab Results  Component Value Date   WBC 7.0 08/04/2013   HGB 11.6* 08/04/2013   HCT 32.1* 08/04/2013   MCV 76.4* 08/04/2013  PLT 222 08/04/2013   Lab Results  Component Value Date   CREATININE 0.73 08/04/2013   BUN 5* 08/04/2013   NA 135 08/04/2013   K 4.0 08/04/2013   CL 100 08/04/2013   CO2 23 08/04/2013    No results found for this basename: HGBA1C   Lipid Panel  No results found for this basename: chol, trig,  hdl, cholhdl, vldl, ldlcalc       Assessment and plan:   Patient Active Problem List   Diagnosis Date Noted  . Pyelonephritis - patient was discharged home on ciprofloxacin and has several more days to complete therapy. I will check electrolyte panel today to make sure that BUN and creatinine are stable and within normal limits. Patient advised to continue drinking plenty of fluids to ensure hydration is adequate. I have advised her it is she does develop fevers and chills, abdominal pain that is worse or not getting better she needs to come back for further evaluation.  08/02/2013  . Hypokalemia - will check electrolyte panel to insure that potassium is stable and within normal limits  08/02/2013

## 2013-08-17 NOTE — Patient Instructions (Signed)

## 2013-09-28 ENCOUNTER — Ambulatory Visit: Payer: Self-pay

## 2013-09-29 ENCOUNTER — Ambulatory Visit: Payer: Self-pay | Attending: Internal Medicine | Admitting: Internal Medicine

## 2013-09-29 VITALS — BP 138/82 | HR 84 | Temp 98.1°F | Ht 64.0 in | Wt 146.4 lb

## 2013-09-29 DIAGNOSIS — R3915 Urgency of urination: Secondary | ICD-10-CM | POA: Insufficient documentation

## 2013-09-29 DIAGNOSIS — M797 Fibromyalgia: Secondary | ICD-10-CM

## 2013-09-29 DIAGNOSIS — R300 Dysuria: Secondary | ICD-10-CM | POA: Insufficient documentation

## 2013-09-29 DIAGNOSIS — IMO0001 Reserved for inherently not codable concepts without codable children: Secondary | ICD-10-CM

## 2013-09-29 MED ORDER — CIPROFLOXACIN HCL 500 MG PO TABS: 500.0000 mg | ORAL_TABLET | Freq: Two times a day (BID) | ORAL | Status: DC

## 2013-09-29 NOTE — Patient Instructions (Signed)

## 2013-09-29 NOTE — Progress Notes (Signed)
Patient ID: Chelsey Wilson, female   DOB: 09/12/1957, 47 y.o.   MRN: [id number]   CC: follow up  HPI: Patient is 47 years old and presents with persistent muscle aches mostly in upper extremities and now progressively worse and lower extremities. This has been going on for over one year. She denies specific radiating symptoms, no systemic symptoms such as fevers and chills, no headaches or visual changes, no specific abdominal concerns. She has tried over-the-counter pain medicines and is reporting no significant relief. She denies recent travels, no sick contacts or exposures. Patient reports 3 day history of urinary urgency and frequency with dysuria.  No Known Allergies No past medical history on file. Current Outpatient Prescriptions on File Prior to Visit  Medication Sig Dispense Refill  . amitriptyline (ELAVIL) 50 MG tablet Take 1 tablet (50 mg total) by mouth at bedtime.  30 tablet  3  . docusate sodium 100 MG CAPS Take 100 mg by mouth 2 (two) times daily as needed for constipation.  60 capsule  0  . HYDROcodone-acetaminophen (NORCO) 5-325 MG per tablet Take 1 tablet by mouth every 8 (eight) hours as needed for pain.  45 tablet  0  . OVER THE COUNTER MEDICATION Take 2 tablets by mouth every 8 (eight) hours as needed. Store brand sinus and head ache relief      . promethazine (PHENERGAN) 12.5 MG tablet Take 1 tablet (12.5 mg total) by mouth every 6 (six) hours as needed for nausea.  30 tablet  1   No current facility-administered medications on file prior to visit.   Family History  Problem Relation Age of Onset  . Other Neg Hx    History   Social History  . Marital Status: Single    Spouse Name: N/A    Number of Children: N/A  . Years of Education: N/A   Occupational History  . Not on file.   Social History Main Topics  . Smoking status: Never Smoker   . Smokeless tobacco: Never Used  . Alcohol Use: No  . Drug Use: No  . Sexual Activity: No   Other Topics Concern  . Not  on file   Social History Narrative  . No narrative on file    Review of Systems  Constitutional: Negative for fever, chills, diaphoresis, activity change, appetite change and fatigue.  HENT: Negative for ear pain, nosebleeds, congestion, facial swelling, rhinorrhea, neck pain, neck stiffness and ear discharge.   Eyes: Negative for pain, discharge, redness, itching and visual disturbance.  Respiratory: Negative for cough, choking, chest tightness, shortness of breath, wheezing and stridor.   Cardiovascular: Negative for chest pain, palpitations and leg swelling.  Gastrointestinal: Negative for abdominal distention.  Genitourinary: Negative for hematuria, flank pain, decreased urine volume,  and dyspareunia.  Musculoskeletal: Negative for joint swelling, arthralgias and gait problem.  Neurological: Negative for dizziness, tremors, seizures, syncope, facial asymmetry, speech difficulty, weakness, light-headedness, numbness and headaches.  Hematological: Negative for adenopathy. Does not bruise/bleed easily.  Psychiatric/Behavioral: Negative for hallucinations, behavioral problems, confusion, dysphoric mood, decreased concentration and agitation.    Objective:   Filed Vitals:   09/29/13 1416  BP: 138/82  Pulse: 84  Temp: 98.1 F (36.7 C)    Physical Exam  Constitutional: Appears well-developed and well-nourished. No distress.  HENT: Normocephalic. External right and left ear normal. Oropharynx is clear and moist.  Eyes: Conjunctivae and EOM are normal. PERRLA, no scleral icterus.  Neck: Normal ROM. Neck supple. No JVD. No tracheal deviation. No  thyromegaly.  CVS: RRR, S1/S2 +, no murmurs, no gallops, no carotid bruit.  Pulmonary: Effort and breath sounds normal, no stridor, rhonchi, wheezes, rales.  Abdominal: Soft. BS +,  no distension, tenderness, rebound or guarding.  Musculoskeletal: Normal range of motion. No edema and no tenderness.  Lymphadenopathy: No lymphadenopathy noted,  cervical, inguinal. Neuro: Alert. Normal reflexes, muscle tone coordination. No cranial nerve deficit. Skin: Skin is warm and dry. No rash noted. Not diaphoretic. No erythema. No pallor.  Psychiatric: Normal mood and affect. Behavior, judgment, thought content normal.   Lab Results  Component Value Date   WBC 7.0 08/04/2013   HGB 11.6* 08/04/2013   HCT 32.1* 08/04/2013   MCV 76.4* 08/04/2013   PLT 222 08/04/2013   Lab Results  Component Value Date   CREATININE 0.95 08/17/2013   BUN 11 08/17/2013   NA 136 08/17/2013   K 4.4 08/17/2013   CL 105 08/17/2013   CO2 28 08/17/2013    No results found for this basename: HGBA1C   Lipid Panel  No results found for this basename: chol, trig, hdl, cholhdl, vldl, ldlcalc       Assessment and plan:   Patient Active Problem List   Diagnosis Date Noted  .  muscle aches  - symptoms appear to be related to fibromyalgia has no clear etiology can be elicited based on physical exam findings and recent electrolyte panel is unremarkable and within normal limits. I will provide referral to rheumatologist. I have advised compliance with physical exercise daily. 08/03/2013  Urinary urgency and frequency - with dysuria, we'll prescribe a course of antibiotic based on her symptoms. Patient able to provide urine sample at this time

## 2013-10-25 ENCOUNTER — Telehealth: Payer: Self-pay | Admitting: Emergency Medicine

## 2013-10-25 ENCOUNTER — Telehealth: Payer: Self-pay | Admitting: Internal Medicine

## 2013-10-25 MED ORDER — CIPROFLOXACIN HCL 500 MG PO TABS: 500.0000 mg | ORAL_TABLET | Freq: Two times a day (BID) | ORAL | Status: DC

## 2013-10-25 NOTE — Telephone Encounter (Signed)
Script for Cipro clarified through pharmacy Pt to take 500 mg x 2 day for 2 weeks Correct dispense is 28 instead of 14

## 2013-10-25 NOTE — Telephone Encounter (Signed)
Spoke with [facility] and updated script

## 2013-10-25 NOTE — Telephone Encounter (Addendum)
Chelsey Wilson from the pharmacy called in regards from Chelsey Wilson medication ciprofloxacin (CIPRO) 500 MG, pharmacist states that the quantity of the medication the way its written should be 28 not 14, please contact pharmacy @ [phone].

## 2014-03-28 ENCOUNTER — Encounter: Payer: Self-pay | Admitting: Internal Medicine

## 2014-03-28 ENCOUNTER — Ambulatory Visit: Payer: Self-pay | Attending: Internal Medicine | Admitting: Internal Medicine

## 2014-03-28 VITALS — BP 133/79 | HR 92 | Temp 97.8°F | Resp 18 | Ht 60.0 in | Wt 142.2 lb

## 2014-03-28 DIAGNOSIS — Z79899 Other long term (current) drug therapy: Secondary | ICD-10-CM | POA: Insufficient documentation

## 2014-03-28 DIAGNOSIS — D638 Anemia in other chronic diseases classified elsewhere: Secondary | ICD-10-CM | POA: Insufficient documentation

## 2014-03-28 DIAGNOSIS — I10 Essential (primary) hypertension: Secondary | ICD-10-CM | POA: Insufficient documentation

## 2014-03-28 MED ORDER — CYCLOBENZAPRINE HCL 5 MG PO TABS: 5.0000 mg | ORAL_TABLET | Freq: Three times a day (TID) | ORAL | Status: DC | PRN

## 2014-03-28 NOTE — Patient Instructions (Signed)
°  Muscle Cramps and Spasms °Muscle cramps and spasms occur when a muscle or muscles tighten and you have no control over this tightening (involuntary muscle contraction). They are a common problem and can develop in any muscle. The most common place is in the calf muscles of the leg. Both muscle cramps and muscle spasms are involuntary muscle contractions, but they also have differences:  °· Muscle cramps are sporadic and painful. They may last a few seconds to a quarter of an hour. Muscle cramps are often more forceful and last longer than muscle spasms. °· Muscle spasms may or may not be painful. They may also last just a few seconds or much longer. °CAUSES  °It is uncommon for cramps or spasms to be due to a serious underlying problem. In many cases, the cause of cramps or spasms is unknown. Some common causes are:  °· Overexertion.   °· Overuse from repetitive motions (doing the same thing over and over).   °· Remaining in a certain position for a long period of time.   °· Improper preparation, form, or technique while performing a sport or activity.   °· Dehydration.   °· Injury.   °· Side effects of some medicines.   °· Abnormally low levels of the salts and ions in your blood (electrolytes), especially potassium and calcium. This could happen if you are taking water pills (diuretics) or you are pregnant.   °Some underlying medical problems can make it more likely to develop cramps or spasms. These include, but are not limited to:  °· Diabetes.   °· Parkinson disease.   °· Hormone disorders, such as thyroid problems.   °· Alcohol abuse.   °· Diseases specific to muscles, joints, and bones.   °· Blood vessel disease where not enough blood is getting to the muscles.   °HOME CARE INSTRUCTIONS  °· Stay well hydrated. Drink enough water and fluids to keep your urine clear or pale yellow. °· It may be helpful to massage, stretch, and relax the affected muscle. °· For tight or tense muscles, use a warm towel, heating  pad, or hot shower water directed to the affected area. °· If you are sore or have pain after a cramp or spasm, applying ice to the affected area may relieve discomfort. °· Put ice in a plastic bag. °· Place a towel between your skin and the bag. °· Leave the ice on for 15-20 minutes, 03-04 times a day. °· Medicines used to treat a known cause of cramps or spasms may help reduce their frequency or severity. Only take over-the-counter or prescription medicines as directed by your caregiver. °SEEK MEDICAL CARE IF:  °Your cramps or spasms get more severe, more frequent, or do not improve over time.  °MAKE SURE YOU:  °· Understand these instructions. °· Will watch your condition. °· Will get help right away if you are not doing well or get worse. °Document Released: 04/03/2002 Document Revised: 02/06/2013 Document Reviewed: 09/28/2012 °[employer]® Patient Information ©2014 [employer]. ° ° °

## 2014-03-28 NOTE — Progress Notes (Signed)
Patient is here to F/U for fibromyalgia. C/O muscle cramping in both legs for 1 month

## 2014-04-11 NOTE — Progress Notes (Signed)
Patient ID: Chelsey Wilson, female   DOB: 09/12/1957, 48 y.o.   MRN: [id number]   CC: Followup  HPI: Patient is 48 year old female who presents for followup. She reports doing well and denies any specific concerns today. No chest pain or shortness of breath, no recent sicknesses or hospitalizations. She checks her blood pressure regularly and says the numbers are usually less than 120/80.  No Known Allergies History reviewed. No pertinent past medical history. Current Outpatient Prescriptions on File Prior to Visit  Medication Sig Dispense Refill  . amitriptyline (ELAVIL) 50 MG tablet Take 1 tablet (50 mg total) by mouth at bedtime.  30 tablet  3  . ciprofloxacin (CIPRO) 500 MG tablet Take 1 tablet (500 mg total) by mouth 2 (two) times daily. X 2 weeks  28 tablet  0  . docusate sodium 100 MG CAPS Take 100 mg by mouth 2 (two) times daily as needed for constipation.  60 capsule  0  . HYDROcodone-acetaminophen (NORCO) 5-325 MG per tablet Take 1 tablet by mouth every 8 (eight) hours as needed for pain.  45 tablet  0  . OVER THE COUNTER MEDICATION Take 2 tablets by mouth every 8 (eight) hours as needed. Store brand sinus and head ache relief      . promethazine (PHENERGAN) 12.5 MG tablet Take 1 tablet (12.5 mg total) by mouth every 6 (six) hours as needed for nausea.  30 tablet  1   No current facility-administered medications on file prior to visit.   Family History  Problem Relation Age of Onset  . Other Neg Hx    History   Social History  . Marital Status: Single    Spouse Name: N/A    Number of Children: N/A  . Years of Education: N/A   Occupational History  . Not on file.   Social History Main Topics  . Smoking status: Never Smoker   . Smokeless tobacco: Never Used  . Alcohol Use: No  . Drug Use: No  . Sexual Activity: No   Other Topics Concern  . Not on file   Social History Narrative  . No narrative on file    Review of Systems  Constitutional: Negative for fever,  chills, diaphoresis, activity change, appetite change and fatigue.  HENT: Negative for ear pain, nosebleeds, congestion, facial swelling, rhinorrhea, neck pain, neck stiffness and ear discharge.   Eyes: Negative for pain, discharge, redness, itching and visual disturbance.  Respiratory: Negative for cough, choking, chest tightness, shortness of breath, wheezing and stridor.   Cardiovascular: Negative for chest pain, palpitations and leg swelling.  Gastrointestinal: Negative for abdominal distention.  Genitourinary: Negative for dysuria, urgency, frequency, hematuria, flank pain, decreased urine volume, difficulty urinating and dyspareunia.  Musculoskeletal: Negative for back pain, joint swelling, arthralgias and gait problem.  Neurological: Negative for dizziness, tremors, seizures, syncope, facial asymmetry, speech difficulty, weakness, light-headedness, numbness and headaches.  Hematological: Negative for adenopathy. Does not bruise/bleed easily.  Psychiatric/Behavioral: Negative for hallucinations, behavioral problems, confusion, dysphoric mood, decreased concentration and agitation.    Objective:   Filed Vitals:   03/28/14 1115  BP: 133/79  Pulse: 92  Temp: 97.8 F (36.6 C)  Resp: 18    Physical Exam  Constitutional: Appears well-developed and well-nourished. No distress.  CVS: RRR, S1/S2 +, no murmurs, no gallops, no carotid bruit.  Pulmonary: Effort and breath sounds normal, no stridor, rhonchi, wheezes, rales.  Abdominal: Soft. BS +,  no distension, tenderness, rebound or guarding.  Musculoskeletal: Normal range of motion.  No edema and no tenderness.    Lab Results  Component Value Date   WBC 7.0 08/04/2013   HGB 11.6* 08/04/2013   HCT 32.1* 08/04/2013   MCV 76.4* 08/04/2013   PLT 222 08/04/2013   Lab Results  Component Value Date   CREATININE 0.95 08/17/2013   BUN 11 08/17/2013   NA 136 08/17/2013   K 4.4 08/17/2013   CL 105 08/17/2013   CO2 28 08/17/2013     No results found for this basename: HGBA1C   Lipid Panel  No results found for this basename: chol, trig, hdl, cholhdl, vldl, ldlcalc       Assessment and plan:   Patient Active Problem List   Diagnosis Date Noted   Hypertension - Stable and at target range - We have discussed checking blood pressure regularly and to call us back if the numbers are higher than 140/90. Anemia of chronic disease - No signs of active bleeding, hemoglobin overall stable. - Patient refuses blood work this visit she says once she has orange card and insurance she will consider blood work.

## 2014-04-18 ENCOUNTER — Emergency Department (INDEPENDENT_AMBULATORY_CARE_PROVIDER_SITE_OTHER): Payer: Self-pay

## 2014-04-18 ENCOUNTER — Emergency Department (INDEPENDENT_AMBULATORY_CARE_PROVIDER_SITE_OTHER)
Admission: EM | Admit: 2014-04-18 | Discharge: 2014-04-18 | Disposition: A | Discharge status: Home / Self Care | Payer: Self-pay | Source: Home / Self Care | Attending: Family Medicine | Admitting: Family Medicine

## 2014-04-18 ENCOUNTER — Encounter (HOSPITAL_COMMUNITY): Payer: Self-pay | Admitting: Emergency Medicine

## 2014-04-18 DIAGNOSIS — S83412A Sprain of medial collateral ligament of left knee, initial encounter: Secondary | ICD-10-CM

## 2014-04-18 DIAGNOSIS — S83419A Sprain of medial collateral ligament of unspecified knee, initial encounter: Secondary | ICD-10-CM

## 2014-04-18 MED ORDER — DICLOFENAC SODIUM 50 MG PO TBEC: 50.0000 mg | DELAYED_RELEASE_TABLET | Freq: Two times a day (BID) | ORAL | Status: DC | PRN
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
## 2014-04-18 NOTE — ED Notes (Signed)
Pt triaged and assessed by provider.   Provider in before nurse. 

## 2014-04-18 NOTE — Discharge Instructions (Signed)
Thank you for coming in today. Purchase a hinged knee brace.                Medial Collateral Knee Ligament Sprain  with Phase I Rehab The medial collateral ligament (MCL) of the knee helps hold the knee joint in proper alignment and prevents the bones from shifting out of alignment (displacing) to the inside (medially). Injury to the knee may cause a tear in the MCL ligament (sprain). Sprains may heal without treatment, but this often results in a loose joint. Sprains are classified into three categories. Grade 1 sprains cause pain, but the tendon is not lengthened. Grade 2 sprains include a lengthened ligament, due to the ligament being stretched or partially ruptured. With grade 2 sprains, there is still function, although possibly decreased. Grade 3 sprains involve a complete tear of the tendon or muscle, and function is usually impaired. SYMPTOMS   Pain and tenderness on the inner side of the knee.  A "pop," tearing or pulling sensation at the time of injury.  Bruising (contusion) at the site of injury, within 48 hours of injury.  Knee stiffness.  Limping, often walking with the knee bent. CAUSES  An MCL sprain occurs when a force is placed on the ligament that is greater than it can handle. Common mechanisms of injury include:  Direct hit (trauma) to the outer side of the knee, especially if the foot is planted on the ground.  Forceful pivoting of the body and leg, while the foot is planted on the ground. RISK INCREASES WITH:  Contact sports (football, rugby).  Sports that require pivoting or cutting (soccer).  Poor knee strength and flexibility.  Improper equipment use. PREVENTION  Warm up and stretch properly before activity.  Maintain physical fitness:  Strength, flexibility and endurance.  Cardiovascular fitness.  Wear properly fitted protective equipment (correct length of cleats for surface).  Functional braces may be effective in preventing  injury. PROGNOSIS  MCL tears usually heal without the need for surgery. Sometimes however, surgery is required. RELATED COMPLICATIONS  Frequently recurring symptoms, such as the knee giving way, knee instability or knee swelling.  Injury to other structures in the knee joint:  Meniscal cartilage, resulting in locking and swelling of the knee.  Articular cartilage, resulting in knee arthritis.  Other ligaments of the knee.  Injury to nerves, resulting in numbness of the outer leg, foot or ankle and weakness or paralysis, with inability to raise the ankle or toes.  Knee stiffness. TREATMENT Treatment first involves the use of ice and medicine, to reduce pain and inflammation. The use of strengthening and stretching exercises may help reduce pain with activity. These exercises may be performed at home, but referral to a therapist is often advised. You may be advised to walk with crutches until you are able to walk without a limp. Your caregiver may provide you with a hinged knee brace to help regain a full range of motion, while also protecting the injured knee. For severe MCL injuries or injuries that involve other ligaments of the knee, surgery is often advised. MEDICATION  Do not take pain medicine for 7 days before surgery.  Only use over-the-counter pain medicine as directed by your caregiver.  Only use prescription pain relievers as directed and only in needed amounts. HEAT AND COLD  Cold treatment (icing) should be applied for 10 to 15 minutes every 2 to 3 hours for inflammation and pain, and immediately after any activity, that aggravates the symptoms. Use ice  packs or an ice massage.  Heat treatment may be used before performing stretching and strengthening activities prescribed by your caregiver, physical therapist or athletic trainer. Use a heat pack or warm water soak. SEEK MEDICAL CARE IF:   Symptoms get worse or do not improve in 4 to 6 weeks, despite treatment.  New,  unexplained symptoms develop. EXERCISES  PHASE I EXERCISES  RANGE OF MOTION (ROM) AND STRETCHING EXERCISES-Medial Collateral Knee Ligament Sprain Phase I These are some of the initial exercises that your physician, physical therapist or athletic trainer may have you perform to begin your rehabilitation. When you demonstrate gains in your flexibility and strength, your caregiver may progress you to Phase II exercises. As you perform these exercises, remember:  These initial exercises are intended to be gentle. They will help you restore motion without increasing any swelling.  Completing these exercises allows less painful movement and prepares you for the more aggressive strengthening exercises in Phase II.  An effective stretch should be held for at least 30 seconds.  A stretch should never be painful. You should only feel a gentle lengthening or release in the stretched tissue. RANGE OF MOTION-Knee Flexion, Active  Lie on your back with both knees straight. (If this causes back discomfort, bend your healthy knee, placing your foot flat on the floor.)  Slowly slide your heel back toward your buttocks until you feel a gentle stretch in the front of your knee or thigh.  Hold for __________ seconds. Slowly slide your heel back to the starting position. Repeat __________ times. Complete this exercise __________ times per day. STRETCH-Knee Flexion, Supine  Lie on the floor with your right / left heel and foot lightly touching the wall. (Place both feet on the wall if you do not use a door frame.)  Without using any effort, allow gravity to slide your foot down the wall slowly until you feel a gentle stretch in the front of your right / left knee.  Hold this stretch for __________ seconds. Then return the leg to the starting position, using your health leg for help, if needed. Repeat __________ times. Complete this stretch __________ times per day. RANGE OF MOTION-Knee Flexion and Extension,  Active-Assisted  Sit on the edge of a table or chair with your thighs firmly supported. It may be helpful to place a folded towel under the end of your right / left thigh.  Flexion (bending): Place the ankle of your healthy leg on top of the other ankle. Use your healthy leg to gently bend your right / left knee until you feel a mild tension across the top of your knee.  Hold for __________ seconds.  Extension (straightening): Switch your ankles so your right / left leg is on top. Use your healthy leg to straighten your right / left knee until you feel a mild tension on the backside of your knee.  Hold for __________ seconds. Repeat __________ times. Complete this exercise __________ times per day. STRETCH-Knee Extension Sitting  Sit with your right / left leg/heel propped on another chair, coffee table, or foot stool.  Allow your leg muscles to relax, letting gravity straighten out your knee.*  You should feel a stretch behind your right / left knee. Hold this position for __________ seconds. Repeat __________ times. Complete this stretch __________ times per day. *Your physician, physical therapist or athletic trainer may instruct you to place a __________ weight on your thigh, just above your kneecap, to deepen the stretch. STRENGTHENING EXERCISES-Medial Collateral  Knee Ligament Sprain Phase I These exercises may help you when beginning to rehabilitate your injury. They may resolve your symptoms with or without further involvement from your physician, physical therapist or athletic trainer. While completing these exercises, remember:   In order to return to more demanding activities, you will likely need to progress to more challenging exercises. Your physician, physical therapist or athletic trainer will advance your exercises when your tissues show adequate healing and your muscles demonstrate increased strength.  Muscles can gain both the endurance and the strength needed for everyday  activities through controlled exercises.  Complete these exercises as instructed by your physician, physical therapist or athletic trainer. Increase the resistance and repetitions only as guided by your caregiver. STRENGTH-Quadriceps, Isometrics  Lie on your back with your right / left leg extended and your opposite knee bent.  Gradually tense the muscles in the front of your right / left thigh. You should see either your kneecap slide up toward your hip or an increased dimpling just above the knee. This motion will push the back of the knee down toward the floor, mat or bed on which you are lying.  Hold the muscle as tight as you can without increasing your pain for __________ seconds.  Relax the muscles slowly and completely in between each repetition. Repeat __________ times. Complete this exercise __________ times per day. STRENGTH-Quadriceps, Short Arcs  Lie on your back. Place a __________ inch towel roll under your knee so that the knee slightly bends.  Raise only your lower leg by tightening the muscles in the front of your thigh. Do not allow your thigh to rise.  Hold this position for __________ seconds. Repeat __________ times. Complete this exercise __________ times per day. OPTIONAL ANKLE WEIGHTS: Begin with ____________________, but DO NOT exceed ____________________. Increase in 1 pound/0.5 kilogram increments.  STRENGTH--Quadriceps, Straight Leg Raises Quality counts! Watch for signs that the quadriceps muscle is working, to be sure you are strengthening the correct muscles and not "cheating" by substituting with healthier muscles.  Lay on your back with your right / left leg extended and your opposite knee bent.  Tense the muscles in the front of your right / left thigh. You should see either your knee cap slide up or increased dimpling just above the knee. Your thigh may even shake a bit.  Tighten these muscles even more and raise your leg 4 to 6 inches off the floor.  Hold for __________ seconds.  Keeping these muscles tense, lower your leg.  Relax the muscles slowly and completely in between each repetition. Repeat __________ times. Complete this exercise __________ times per day. STRENGTH-Hamstring, Isometrics  Lie on your back on a firm surface.  Bend your right / left knee approximately __________ degrees.  Dig your heel into the surface as if you are trying to pull it toward your buttocks. Tighten the muscles in the back of your thighs to "dig" as hard as you can, without increasing any pain.  Hold this position for __________ seconds.  Release the tension gradually and allow your muscle to completely relax for __________ seconds in between each exercise. Repeat __________ times. Complete this exercise __________ times per day. STRENGTH-Hamstring, Curls  Lay on your stomach with your legs extended. (If you lay on a bed, your feet may hang over the edge.)  Tighten the muscles in the back of your thigh to bend your right / left knee up to 90 degrees. Keep your hips flat on the bed.  Hold this position for __________ seconds.  Slowly lower your leg back to the starting position. Repeat __________ times. Complete this exercise __________ times per day. OPTIONAL ANKLE WEIGHTS: Begin with ____________________, but DO NOT exceed ____________________. Increase in 1 pound/0.5 kilogram increments.  Document Released: 10/12/2005 Document Revised: 01/04/2012 Document Reviewed: 01/24/2009 [facility] Patient Information 2015 [place], [place]. This information is not intended to replace advice given to you by your health care provider. Make sure you discuss any questions you have with your health care provider.

## 2014-09-17 ENCOUNTER — Encounter: Payer: Self-pay | Admitting: Internal Medicine

## 2014-09-17 ENCOUNTER — Ambulatory Visit: Payer: Self-pay | Attending: Internal Medicine | Admitting: Internal Medicine

## 2014-09-17 VITALS — BP 147/86 | HR 77 | Temp 98.4°F | Resp 16 | Ht 60.0 in | Wt 141.0 lb

## 2014-09-17 DIAGNOSIS — Z0000 Encounter for general adult medical examination without abnormal findings: Secondary | ICD-10-CM

## 2014-09-17 DIAGNOSIS — M797 Fibromyalgia: Secondary | ICD-10-CM

## 2014-09-17 DIAGNOSIS — I10 Essential (primary) hypertension: Secondary | ICD-10-CM | POA: Insufficient documentation

## 2014-09-17 DIAGNOSIS — G8929 Other chronic pain: Secondary | ICD-10-CM | POA: Insufficient documentation

## 2014-09-17 LAB — POCT GLYCOSYLATED HEMOGLOBIN (HGB A1C): HEMOGLOBIN A1C: 5.8

## 2014-09-17 MED ORDER — AMITRIPTYLINE HCL 50 MG PO TABS: 50.0000 mg | ORAL_TABLET | Freq: Every day | ORAL | Status: DC

## 2014-09-17 MED ORDER — DICLOFENAC SODIUM 50 MG PO TBEC: 50.0000 mg | DELAYED_RELEASE_TABLET | Freq: Two times a day (BID) | ORAL | Status: DC | PRN

## 2014-09-17 NOTE — Progress Notes (Signed)
Pt is here following up on her fibromyalgia.  Pt states that her left ankle is swollen and very painful. Pt also states that she has widespread pain. Today her arms are hurt really bad too.

## 2014-09-17 NOTE — Progress Notes (Signed)
Patient ID: Chelsey Wilson, female   DOB: 09/12/1957, 48 y.o.   MRN: [id number]   Amandy Chubbuck, is a 48 y.o. female  AST:[id number]  LGX:[id number]  DOB - 09/12/1957  Chief Complaint  Patient presents with  . Follow-up        Subjective:   Chelsey Wilson is a 48 y.o. female here today for a follow up visit. Patient has history of fibromyalgia and hypertension here today for routine follow-up. She complains of widespread pain from her fibromyalgia and it is slightly swollen left ankle that is very painful. No history of trauma. No history of fall. Patient is requesting pain medication. She has not had colonoscopy. She is due for mammogram. Patient has No headache, No chest pain, No abdominal pain - No Nausea, No new weakness tingling or numbness, No Cough - SOB.  Problem  Fibromyalgia  Essential Hypertension, Benign    ALLERGIES: No Known Allergies  PAST MEDICAL HISTORY: History reviewed. No pertinent past medical history.  MEDICATIONS AT HOME: Prior to Admission medications   Medication Sig Start Date End Date Taking? Authorizing Provider  amitriptyline (ELAVIL) 50 MG tablet Take 1 tablet (50 mg total) by mouth at bedtime. 09/17/14   Tresa Garter, MD  diclofenac (VOLTAREN) 50 MG EC tablet Take 1 tablet (50 mg total) by mouth 2 (two) times daily as needed. 09/17/14   Tresa Garter, MD  docusate sodium 100 MG CAPS Take 100 mg by mouth 2 (two) times daily as needed for constipation. Patient not taking: Reported on 09/17/2014 08/04/13   Ripudeep Krystal Eaton, MD  OVER THE COUNTER MEDICATION Take 2 tablets by mouth every 8 (eight) hours as needed. Store brand sinus and head ache relief    Historical Provider, MD     Objective:   Filed Vitals:   09/17/14 1617  BP: 147/86  Pulse: 77  Temp: 98.4 F (36.9 C)  TempSrc: Oral  Resp: 16  Height: 5' (1.524 m)  Weight: 141 lb (63.957 kg)  SpO2: 100%    Exam General appearance : Awake, alert, not in any distress. Speech  Clear. Not toxic looking HEENT: Atraumatic and Normocephalic, pupils equally reactive to light and accomodation Neck: supple, no JVD. No cervical lymphadenopathy.  Chest:Good air entry bilaterally, no added sounds  CVS: S1 S2 regular, no murmurs.  Abdomen: Bowel sounds present, Non tender and not distended with no gaurding, rigidity or rebound. Extremities: B/L Lower Ext shows no edema, both legs are warm to touch Neurology: Awake alert, and oriented X 3, CN II-XII intact, Non focal  Data Review No results found for: HGBA1C   Assessment & Plan   1. Fibromyalgia Prescribed - diclofenac (VOLTAREN) 50 MG EC tablet; Take 1 tablet (50 mg total) by mouth 2 (two) times daily as needed.  Dispense: 60 tablet; Refill: 3 - amitriptyline (ELAVIL) 50 MG tablet; Take 1 tablet (50 mg total) by mouth at bedtime.  Dispense: 30 tablet; Refill: 3  2. Essential hypertension, benign  - CBC with Differential - COMPLETE METABOLIC PANEL WITH GFR - POCT glycosylated hemoglobin (Hb A1C) - Lipid panel - TSH - MM Digital Screening; Future - HM COLONOSCOPY - Ambulatory referral to Gastroenterology  - We have discussed target BP range and blood pressure goal - I have advised patient to check BP regularly and to call us back or report to clinic if the numbers are consistently higher than 140/90  - We discussed the importance of compliance with medical therapy and DASH diet recommended, consequences of  uncontrolled hypertension discussed.  - continue current BP medications  Return in about 6 months (around 03/18/2015) for Follow up HTN, Pap Smear.  The patient was given clear instructions to go to ER or return to medical center if symptoms don't improve, worsen or new problems develop. The patient verbalized understanding. The patient was told to call to get lab results if they haven't heard anything in the next week.   This note has been created with Automotive engineer. Any transcriptional errors are unintentional.    Angelica Chessman, MD, [place], [place], [place] and [place], [place]   09/17/2014, 5:36 PM

## 2014-09-18 LAB — COMPLETE METABOLIC PANEL WITH GFR
ALT: 9 U/L (ref 0–35)
AST: 14 U/L (ref 0–37)
Albumin: 4 g/dL (ref 3.5–5.2)
Alkaline Phosphatase: 89 U/L (ref 39–117)
BILIRUBIN TOTAL: 0.3 mg/dL (ref 0.2–1.2)
BUN: 14 mg/dL (ref 6–23)
CALCIUM: 9.3 mg/dL (ref 8.4–10.5)
CO2: 28 meq/L (ref 19–32)
CREATININE: 0.62 mg/dL (ref 0.50–1.10)
Chloride: 107 mEq/L (ref 96–112)
Glucose, Bld: 104 mg/dL — ABNORMAL HIGH (ref 70–99)
Potassium: 4.4 mEq/L (ref 3.5–5.3)
SODIUM: 143 meq/L (ref 135–145)
TOTAL PROTEIN: 6.9 g/dL (ref 6.0–8.3)

## 2014-09-18 LAB — CBC WITH DIFFERENTIAL/PLATELET
Basophils Absolute: 0 10*3/uL (ref 0.0–0.1)
Basophils Relative: 0 % (ref 0–1)
EOS PCT: 3 % (ref 0–5)
Eosinophils Absolute: 0.2 10*3/uL (ref 0.0–0.7)
HCT: 35.5 % — ABNORMAL LOW (ref 36.0–46.0)
Hemoglobin: 11.7 g/dL — ABNORMAL LOW (ref 12.0–15.0)
Lymphocytes Relative: 41 % (ref 12–46)
Lymphs Abs: 2.1 10*3/uL (ref 0.7–4.0)
MCH: 26.8 pg (ref 26.0–34.0)
MCHC: 33 g/dL (ref 30.0–36.0)
MCV: 81.4 fL (ref 78.0–100.0)
MONOS PCT: 16 % — AB (ref 3–12)
MPV: 11.1 fL (ref 9.4–12.4)
Monocytes Absolute: 0.8 10*3/uL (ref 0.1–1.0)
Neutro Abs: 2.1 10*3/uL (ref 1.7–7.7)
Neutrophils Relative %: 40 % — ABNORMAL LOW (ref 43–77)
Platelets: 237 10*3/uL (ref 150–400)
RBC: 4.36 MIL/uL (ref 3.87–5.11)
RDW: 14.4 % (ref 11.5–15.5)
WBC: 5.2 10*3/uL (ref 4.0–10.5)

## 2014-09-18 LAB — LIPID PANEL
Cholesterol: 228 mg/dL — ABNORMAL HIGH (ref 0–200)
HDL: 44 mg/dL (ref >39)
LDL Cholesterol: 163 mg/dL — ABNORMAL HIGH (ref 0–99)
Total CHOL/HDL Ratio: 5.2 Ratio
Triglycerides: 107 mg/dL (ref <150)
VLDL: 21 mg/dL (ref 0–40)

## 2014-09-18 LAB — TSH: TSH: 1.137 u[IU]/mL (ref 0.350–4.500)

## 2015-03-18 ENCOUNTER — Ambulatory Visit: Payer: 59 | Attending: Internal Medicine | Admitting: Internal Medicine

## 2015-03-18 ENCOUNTER — Encounter: Payer: Self-pay | Admitting: Internal Medicine

## 2015-03-18 VITALS — BP 170/85 | HR 84 | Wt 143.0 lb

## 2015-03-18 DIAGNOSIS — E785 Hyperlipidemia, unspecified: Secondary | ICD-10-CM | POA: Insufficient documentation

## 2015-03-18 DIAGNOSIS — I10 Essential (primary) hypertension: Secondary | ICD-10-CM

## 2015-03-18 DIAGNOSIS — G894 Chronic pain syndrome: Secondary | ICD-10-CM

## 2015-03-18 MED ORDER — SIMVASTATIN 20 MG PO TABS: 20.0000 mg | ORAL_TABLET | Freq: Every day | ORAL | Status: DC

## 2015-03-18 MED ORDER — HYDROCHLOROTHIAZIDE 25 MG PO TABS: 25.0000 mg | ORAL_TABLET | Freq: Every day | ORAL | Status: DC

## 2015-03-18 MED ORDER — DICLOFENAC SODIUM 50 MG PO TBEC: 50.0000 mg | DELAYED_RELEASE_TABLET | Freq: Two times a day (BID) | ORAL | Status: DC | PRN

## 2015-03-18 NOTE — Progress Notes (Signed)
Patient ID: Chelsey Wilson, female   DOB: 09/12/1957, 49 y.o.   MRN: [id number]   Chelsey Wilson, is a 49 y.o. female  CSN:[id number]  MRN:[id number]  DOB - 09/12/1957  Chief Complaint  Patient presents with  . Hypertension follow up        Subjective:   Chelsey Wilson is a 49 y.o. female here today for a follow up visit. Patient with history of elevated blood pressure not officially diagnosed as hypertension according to patient, dyslipidemia here today for routine follow-up. She is complaining of pain in both her feet and upper limbs, there is a diagnosis of fibromyalgia in her chart but patient is not rule out this diagnosis, she claims she does not have fibromyalgia. She is currently not on any medication except for dyslipidemia, hypertension or fibromyalgia. Patient's blood pressure is high today, and on her record her last LDL was 163. Patient reports no other symptoms apart from the pain in her feet. Patient has No headache, No chest pain, No abdominal pain - No Nausea, No new weakness tingling or numbness, No Cough - SOB.  Problem  Dyslipidemia  Chronic Pain    ALLERGIES: No Known Allergies  PAST MEDICAL HISTORY: No past medical history on file.  MEDICATIONS AT HOME: Prior to Admission medications   Medication Sig Start Date End Date Taking? Authorizing Provider  amitriptyline (ELAVIL) 50 MG tablet Take 1 tablet (50 mg total) by mouth at bedtime. 09/17/14  Yes Olugbemiga E Jegede, MD  diclofenac (VOLTAREN) 50 MG EC tablet Take 1 tablet (50 mg total) by mouth 2 (two) times daily as needed. 03/18/15  Yes Olugbemiga E Jegede, MD  docusate sodium 100 MG CAPS Take 100 mg by mouth 2 (two) times daily as needed for constipation. 08/04/13  Yes Ripudeep K Rai, MD  OVER THE COUNTER MEDICATION Take 2 tablets by mouth every 8 (eight) hours as needed. Store brand sinus and head ache relief   Yes Historical Provider, MD  hydrochlorothiazide (HYDRODIURIL) 25 MG tablet Take 1 tablet (25 mg  total) by mouth daily. 03/18/15   Olugbemiga E Jegede, MD  simvastatin (ZOCOR) 20 MG tablet Take 1 tablet (20 mg total) by mouth at bedtime. 03/18/15   Olugbemiga E Jegede, MD     Objective:   Filed Vitals:   03/18/15 1034  BP: 170/85  Pulse: 84  Weight: 143 lb (64.864 kg)  SpO2: 100%    Exam General appearance : Awake, alert, not in any distress. Speech Clear. Not toxic looking HEENT: Atraumatic and Normocephalic, pupils equally reactive to light and accomodation Neck: supple, no JVD. No cervical lymphadenopathy.  Chest:Good air entry bilaterally, no added sounds  CVS: S1 S2 regular, no murmurs.  Abdomen: Bowel sounds present, Non tender and not distended with no gaurding, rigidity or rebound. Extremities: B/L Lower Ext shows no edema, both legs are warm to touch Neurology: Awake alert, and oriented X 3, CN II-XII intact, Non focal Skin:No Rash  Data Review Lab Results  Component Value Date   HGBA1C 5.8 09/17/2014     Assessment & Plan   1. Essential hypertension, benign:  Start patient on diuretic today - hydrochlorothiazide (HYDRODIURIL) 25 MG tablet; Take 1 tablet (25 mg total) by mouth daily.  Dispense: 90 tablet; Refill: 3 - We have discussed target BP range and blood pressure goal - I have advised patient to check BP regularly and to call us back or report to clinic if the numbers are consistently higher than 140/90  - We discussed   the importance of compliance with medical therapy and DASH diet recommended, consequences of uncontrolled hypertension discussed.   2. Chronic pain disorder - There is no official diagnosis of fibromyalgia, I will take this out of patient's medical history. Will investigate the chronic pain further with ANA, ESR and CPK if symptoms persist.  - diclofenac (VOLTAREN) 50 MG EC tablet; Take 1 tablet (50 mg total) by mouth 2 (two) times daily as needed.  Dispense: 60 tablet; Refill: 0  3. Dyslipidemia  - simvastatin (ZOCOR) 20 MG tablet;  Take 1 tablet (20 mg total) by mouth at bedtime.  Dispense: 90 tablet; Refill: 3 To address this please limit saturated fat to no more than 7% of your calories, limit cholesterol to 200 mg/day, increase fiber and exercise as tolerated.  Patient have been counseled extensively about nutrition and exercise  Return in about 3 months (around 06/18/2015) for Follow up HTN, Follow up Pain and comorbidities.  The patient was given clear instructions to go to ER or return to medical center if symptoms don't improve, worsen or new problems develop. The patient verbalized understanding. The patient was told to call to get lab results if they haven't heard anything in the next week.   This note has been created with Surveyor, quantity. Any transcriptional errors are unintentional.    Angelica Chessman, MD, [place], [place], [place], [place] and [facility] [place], [place]   03/18/2015, 11:19 AM

## 2015-03-18 NOTE — Patient Instructions (Signed)
DASH Eating Plan DASH stands for "Dietary Approaches to Stop Hypertension." The DASH eating plan is a healthy eating plan that has been shown to reduce high blood pressure (hypertension). Additional health benefits may include reducing the risk of type 2 diabetes mellitus, heart disease, and stroke. The DASH eating plan may also help with weight loss. WHAT DO I NEED TO KNOW ABOUT THE DASH EATING PLAN? For the DASH eating plan, you will follow these general guidelines:  Choose foods with a percent daily value for sodium of less than 5% (as listed on the food label).  Use salt-free seasonings or herbs instead of table salt or sea salt.  Check with your health care provider or pharmacist before using salt substitutes.  Eat lower-sodium products, often labeled as "lower sodium" or "no salt added."  Eat fresh foods.  Eat more vegetables, fruits, and low-fat dairy products.  Choose whole grains. Look for the word "whole" as the first word in the ingredient list.  Choose fish and skinless chicken or turkey more often than red meat. Limit fish, poultry, and meat to 6 oz (170 g) each day.  Limit sweets, desserts, sugars, and sugary drinks.  Choose heart-healthy fats.  Limit cheese to 1 oz (28 g) per day.  Eat more home-cooked food and less restaurant, buffet, and fast food.  Limit fried foods.  Cook foods using methods other than frying.  Limit canned vegetables. If you do use them, rinse them well to decrease the sodium.  When eating at a restaurant, ask that your food be prepared with less salt, or no salt if possible. WHAT FOODS CAN I EAT? Seek help from a dietitian for individual calorie needs. Grains Whole grain or whole wheat bread. Brown rice. Whole grain or whole wheat pasta. Quinoa, bulgur, and whole grain cereals. Low-sodium cereals. Corn or whole wheat flour tortillas. Whole grain cornbread. Whole grain crackers. Low-sodium crackers. Vegetables Fresh or frozen vegetables  (raw, steamed, roasted, or grilled). Low-sodium or reduced-sodium tomato and vegetable juices. Low-sodium or reduced-sodium tomato sauce and paste. Low-sodium or reduced-sodium canned vegetables.  Fruits All fresh, canned (in natural juice), or frozen fruits. Meat and Other Protein Products Ground beef (85% or leaner), grass-fed beef, or beef trimmed of fat. Skinless chicken or turkey. Ground chicken or turkey. Pork trimmed of fat. All fish and seafood. Eggs. Dried beans, peas, or lentils. Unsalted nuts and seeds. Unsalted canned beans. Dairy Low-fat dairy products, such as skim or 1% milk, 2% or reduced-fat cheeses, low-fat ricotta or cottage cheese, or plain low-fat yogurt. Low-sodium or reduced-sodium cheeses. Fats and Oils Tub margarines without trans fats. Light or reduced-fat mayonnaise and salad dressings (reduced sodium). Avocado. Safflower, olive, or canola oils. Natural peanut or almond butter. Other Unsalted popcorn and pretzels. The items listed above may not be a complete list of recommended foods or beverages. Contact your dietitian for more options. WHAT FOODS ARE NOT RECOMMENDED? Grains White bread. White pasta. White rice. Refined cornbread. Bagels and croissants. Crackers that contain trans fat. Vegetables Creamed or fried vegetables. Vegetables in a cheese sauce. Regular canned vegetables. Regular canned tomato sauce and paste. Regular tomato and vegetable juices. Fruits Dried fruits. Canned fruit in light or heavy syrup. Fruit juice. Meat and Other Protein Products Fatty cuts of meat. Ribs, chicken wings, bacon, sausage, bologna, salami, chitterlings, fatback, hot dogs, bratwurst, and packaged luncheon meats. Salted nuts and seeds. Canned beans with salt. Dairy Whole or 2% milk, cream, half-and-half, and cream cheese. Whole-fat or sweetened yogurt. Full-fat   cheeses or blue cheese. Nondairy creamers and whipped toppings. Processed cheese, cheese spreads, or cheese  curds. Condiments Onion and garlic salt, seasoned salt, table salt, and sea salt. Canned and packaged gravies. Worcestershire sauce. Tartar sauce. Barbecue sauce. Teriyaki sauce. Soy sauce, including reduced sodium. Steak sauce. Fish sauce. Oyster sauce. Cocktail sauce. Horseradish. Ketchup and mustard. Meat flavorings and tenderizers. Bouillon cubes. Hot sauce. Tabasco sauce. Marinades. Taco seasonings. Relishes. Fats and Oils Butter, stick margarine, lard, shortening, ghee, and bacon fat. Coconut, palm kernel, or palm oils. Regular salad dressings. Other Pickles and olives. Salted popcorn and pretzels. The items listed above may not be a complete list of foods and beverages to avoid. Contact your dietitian for more information. WHERE CAN I FIND MORE INFORMATION? National Heart, Lung, and Blood Institute: www.nhlbi.nih.gov/health/health-topics/topics/dash/ Document Released: 10/01/2011 Document Revised: 02/26/2014 Document Reviewed: 08/16/2013 ExitCare Patient Information 2015 [employer]. This information is not intended to replace advice given to you by your health care provider. Make sure you discuss any questions you have with your health care provider. Hypertension Hypertension, commonly called high blood pressure, is when the force of blood pumping through your arteries is too strong. Your arteries are the blood vessels that carry blood from your heart throughout your body. A blood pressure reading consists of a higher number over a lower number, such as 110/72. The higher number (systolic) is the pressure inside your arteries when your heart pumps. The lower number (diastolic) is the pressure inside your arteries when your heart relaxes. Ideally you want your blood pressure below 120/80. Hypertension forces your heart to work harder to pump blood. Your arteries may become narrow or stiff. Having hypertension puts you at risk for heart disease, stroke, and other problems.  RISK  FACTORS Some risk factors for high blood pressure are controllable. Others are not.  Risk factors you cannot control include:   Race. You may be at higher risk if you are African American.  Age. Risk increases with age.  Gender. Men are at higher risk than women before age 45 years. After age 65, women are at higher risk than men. Risk factors you can control include:  Not getting enough exercise or physical activity.  Being overweight.  Getting too much fat, sugar, calories, or salt in your diet.  Drinking too much alcohol. SIGNS AND SYMPTOMS Hypertension does not usually cause signs or symptoms. Extremely high blood pressure (hypertensive crisis) may cause headache, anxiety, shortness of breath, and nosebleed. DIAGNOSIS  To check if you have hypertension, your health care provider will measure your blood pressure while you are seated, with your arm held at the level of your heart. It should be measured at least twice using the same arm. Certain conditions can cause a difference in blood pressure between your right and left arms. A blood pressure reading that is higher than normal on one occasion does not mean that you need treatment. If one blood pressure reading is high, ask your health care provider about having it checked again. TREATMENT  Treating high blood pressure includes making lifestyle changes and possibly taking medicine. Living a healthy lifestyle can help lower high blood pressure. You may need to change some of your habits. Lifestyle changes may include:  Following the DASH diet. This diet is high in fruits, vegetables, and whole grains. It is low in salt, red meat, and added sugars.  Getting at least 2 hours of brisk physical activity every week.  Losing weight if necessary.  Not smoking.  Limiting   alcoholic beverages.  Learning ways to reduce stress. If lifestyle changes are not enough to get your blood pressure under control, your health care provider may  prescribe medicine. You may need to take more than one. Work closely with your health care provider to understand the risks and benefits. HOME CARE INSTRUCTIONS  Have your blood pressure rechecked as directed by your health care provider.   Take medicines only as directed by your health care provider. Follow the directions carefully. Blood pressure medicines must be taken as prescribed. The medicine does not work as well when you skip doses. Skipping doses also puts you at risk for problems.   Do not smoke.   Monitor your blood pressure at home as directed by your health care provider. SEEK MEDICAL CARE IF:   You think you are having a reaction to medicines taken.  You have recurrent headaches or feel dizzy.  You have swelling in your ankles.  You have trouble with your vision. SEEK IMMEDIATE MEDICAL CARE IF:  You develop a severe headache or confusion.  You have unusual weakness, numbness, or feel faint.  You have severe chest or abdominal pain.  You vomit repeatedly.  You have trouble breathing. MAKE SURE YOU:   Understand these instructions.  Will watch your condition.  Will get help right away if you are not doing well or get worse. Document Released: 10/12/2005 Document Revised: 02/26/2014 Document Reviewed: 08/04/2013 ExitCare Patient Information 2015 [employer]. This information is not intended to replace advice given to you by your health care provider. Make sure you discuss any questions you have with your health care provider.  

## 2015-03-18 NOTE — Progress Notes (Signed)
Pt is here for Hypertension follow up. She states she did not take medicine this morning prior to coming to clinic.

## 2015-08-15 ENCOUNTER — Ambulatory Visit: Payer: 59 | Attending: Internal Medicine | Admitting: Internal Medicine

## 2015-08-15 ENCOUNTER — Encounter: Payer: Self-pay | Admitting: Internal Medicine

## 2015-08-15 VITALS — BP 139/92 | HR 71 | Temp 98.6°F | Resp 18 | Ht 63.0 in | Wt 138.0 lb

## 2015-08-15 DIAGNOSIS — I10 Essential (primary) hypertension: Secondary | ICD-10-CM | POA: Diagnosis not present

## 2015-08-15 DIAGNOSIS — G8929 Other chronic pain: Secondary | ICD-10-CM | POA: Diagnosis not present

## 2015-08-15 DIAGNOSIS — G894 Chronic pain syndrome: Secondary | ICD-10-CM

## 2015-08-15 DIAGNOSIS — Z79899 Other long term (current) drug therapy: Secondary | ICD-10-CM | POA: Diagnosis not present

## 2015-08-15 DIAGNOSIS — E785 Hyperlipidemia, unspecified: Secondary | ICD-10-CM | POA: Diagnosis not present

## 2015-08-15 DIAGNOSIS — Z5189 Encounter for other specified aftercare: Secondary | ICD-10-CM | POA: Diagnosis present

## 2015-08-15 DIAGNOSIS — M797 Fibromyalgia: Secondary | ICD-10-CM | POA: Insufficient documentation

## 2015-08-15 DIAGNOSIS — Z1231 Encounter for screening mammogram for malignant neoplasm of breast: Secondary | ICD-10-CM

## 2015-08-15 MED ORDER — AMITRIPTYLINE HCL 50 MG PO TABS: 50.0000 mg | ORAL_TABLET | Freq: Every day | ORAL | Status: DC

## 2015-08-15 MED ORDER — SIMVASTATIN 20 MG PO TABS: 20.0000 mg | ORAL_TABLET | Freq: Every day | ORAL | Status: DC

## 2015-08-15 MED ORDER — DICLOFENAC SODIUM 50 MG PO TBEC: 50.0000 mg | DELAYED_RELEASE_TABLET | Freq: Two times a day (BID) | ORAL | Status: DC | PRN

## 2015-08-15 NOTE — Patient Instructions (Signed)
DASH Eating Plan  DASH stands for "Dietary Approaches to Stop Hypertension." The DASH eating plan is a healthy eating plan that has been shown to reduce high blood pressure (hypertension). Additional health benefits may include reducing the risk of type 2 diabetes mellitus, heart disease, and stroke. The DASH eating plan may also help with weight loss.  WHAT DO I NEED TO KNOW ABOUT THE DASH EATING PLAN?  For the DASH eating plan, you will follow these general guidelines:  · Choose foods with a percent daily value for sodium of less than 5% (as listed on the food label).  · Use salt-free seasonings or herbs instead of table salt or sea salt.  · Check with your health care provider or pharmacist before using salt substitutes.  · Eat lower-sodium products, often labeled as "lower sodium" or "no salt added."  · Eat fresh foods.  · Eat more vegetables, fruits, and low-fat dairy products.  · Choose whole grains. Look for the word "whole" as the first word in the ingredient list.  · Choose fish and skinless chicken or turkey more often than red meat. Limit fish, poultry, and meat to 6 oz (170 g) each day.  · Limit sweets, desserts, sugars, and sugary drinks.  · Choose heart-healthy fats.  · Limit cheese to 1 oz (28 g) per day.  · Eat more home-cooked food and less restaurant, buffet, and fast food.  · Limit fried foods.  · Cook foods using methods other than frying.  · Limit canned vegetables. If you do use them, rinse them well to decrease the sodium.  · When eating at a restaurant, ask that your food be prepared with less salt, or no salt if possible.  WHAT FOODS CAN I EAT?  Seek help from a dietitian for individual calorie needs.  Grains  Whole grain or whole wheat bread. Brown rice. Whole grain or whole wheat pasta. Quinoa, bulgur, and whole grain cereals. Low-sodium cereals. Corn or whole wheat flour tortillas. Whole grain cornbread. Whole grain crackers. Low-sodium crackers.  Vegetables  Fresh or frozen vegetables  (raw, steamed, roasted, or grilled). Low-sodium or reduced-sodium tomato and vegetable juices. Low-sodium or reduced-sodium tomato sauce and paste. Low-sodium or reduced-sodium canned vegetables.   Fruits  All fresh, canned (in natural juice), or frozen fruits.  Meat and Other Protein Products  Ground beef (85% or leaner), grass-fed beef, or beef trimmed of fat. Skinless chicken or turkey. Ground chicken or turkey. Pork trimmed of fat. All fish and seafood. Eggs. Dried beans, peas, or lentils. Unsalted nuts and seeds. Unsalted canned beans.  Dairy  Low-fat dairy products, such as skim or 1% milk, 2% or reduced-fat cheeses, low-fat ricotta or cottage cheese, or plain low-fat yogurt. Low-sodium or reduced-sodium cheeses.  Fats and Oils  Tub margarines without trans fats. Light or reduced-fat mayonnaise and salad dressings (reduced sodium). Avocado. Safflower, olive, or canola oils. Natural peanut or almond butter.  Other  Unsalted popcorn and pretzels.  The items listed above may not be a complete list of recommended foods or beverages. Contact your dietitian for more options.  WHAT FOODS ARE NOT RECOMMENDED?  Grains  White bread. White pasta. White rice. Refined cornbread. Bagels and croissants. Crackers that contain trans fat.  Vegetables  Creamed or fried vegetables. Vegetables in a cheese sauce. Regular canned vegetables. Regular canned tomato sauce and paste. Regular tomato and vegetable juices.  Fruits  Dried fruits. Canned fruit in light or heavy syrup. Fruit juice.  Meat and Other Protein   Products  Fatty cuts of meat. Ribs, chicken wings, bacon, sausage, bologna, salami, chitterlings, fatback, hot dogs, bratwurst, and packaged luncheon meats. Salted nuts and seeds. Canned beans with salt.  Dairy  Whole or 2% milk, cream, half-and-half, and cream cheese. Whole-fat or sweetened yogurt. Full-fat cheeses or blue cheese. Nondairy creamers and whipped toppings. Processed cheese, cheese spreads, or cheese  curds.  Condiments  Onion and garlic salt, seasoned salt, table salt, and sea salt. Canned and packaged gravies. Worcestershire sauce. Tartar sauce. Barbecue sauce. Teriyaki sauce. Soy sauce, including reduced sodium. Steak sauce. Fish sauce. Oyster sauce. Cocktail sauce. Horseradish. Ketchup and mustard. Meat flavorings and tenderizers. Bouillon cubes. Hot sauce. Tabasco sauce. Marinades. Taco seasonings. Relishes.  Fats and Oils  Butter, stick margarine, lard, shortening, ghee, and bacon fat. Coconut, palm kernel, or palm oils. Regular salad dressings.  Other  Pickles and olives. Salted popcorn and pretzels.  The items listed above may not be a complete list of foods and beverages to avoid. Contact your dietitian for more information.  WHERE CAN I FIND MORE INFORMATION?  National Heart, Lung, and Blood Institute: www.nhlbi.nih.gov/health/health-topics/topics/dash/     This information is not intended to replace advice given to you by your health care provider. Make sure you discuss any questions you have with your health care provider.     Document Released: 10/01/2011 Document Revised: 11/02/2014 Document Reviewed: 08/16/2013  Elsevier Interactive Patient Education ©2016 Else[employer] Inc.

## 2015-08-15 NOTE — Progress Notes (Signed)
Patient ID: Chelsey Wilson, female   DOB: 09/12/1957, 49 y.o.   MRN: [id number]   Myeshia Fojtik, is a 49 y.o. female  [id number]  [id number]  DOB - 09/12/1957  Chief Complaint  Patient presents with  . Follow-up        Subjective:   Chelsey Wilson is a 49 y.o. female here today for a follow up visit. Patient has medical history of elevated blood pressure for which she was started on low-dose hydrochlorothiazide but patient has not taken this medication, fibromyalgia, chronic pain syndrome and dyslipidemia. Patient claims that she does not have hypertension and not ever take medications. Patient is complaining of left ear itching and feels like something is "walking in their". She denies any foreign body or instrumentation in the ears. She has no fever, no discharge from the ears. She has not had mammogram. She does not smoke cigarette, she does not drink alcohol. Patient has No headache, No chest pain, No abdominal pain - No Nausea, No new weakness tingling or numbness, No Cough - SOB.  No problems updated.  ALLERGIES: No Known Allergies  PAST MEDICAL HISTORY: History reviewed. No pertinent past medical history.  MEDICATIONS AT HOME: Prior to Admission medications   Medication Sig Start Date End Date Taking? Authorizing Provider  amitriptyline (ELAVIL) 50 MG tablet Take 1 tablet (50 mg total) by mouth at bedtime. 08/15/15  Yes Tresa Garter, MD  diclofenac (VOLTAREN) 50 MG EC tablet Take 1 tablet (50 mg total) by mouth 2 (two) times daily as needed. 08/15/15  Yes Tresa Garter, MD  docusate sodium 100 MG CAPS Take 100 mg by mouth 2 (two) times daily as needed for constipation. 08/04/13  Yes Ripudeep Krystal Eaton, MD  simvastatin (ZOCOR) 20 MG tablet Take 1 tablet (20 mg total) by mouth at bedtime. 08/15/15  Yes Tresa Garter, MD  hydrochlorothiazide (HYDRODIURIL) 25 MG tablet Take 1 tablet (25 mg total) by mouth daily. Patient not taking: Reported on 08/15/2015 03/18/15    Tresa Garter, MD  OVER THE COUNTER MEDICATION Take 2 tablets by mouth every 8 (eight) hours as needed. Store brand sinus and head ache relief    Historical Provider, MD     Objective:   Filed Vitals:   08/15/15 1659  BP: 139/92  Pulse: 71  Temp: 98.6 F (37 C)  TempSrc: Oral  Resp: 18  Height: 5\' 3"  (1.6 m)  Weight: 138 lb (62.596 kg)  SpO2: 99%    Exam General appearance : Awake, alert, not in any distress. Speech Clear. Not toxic looking HEENT: Atraumatic and Normocephalic, pupils equally reactive to light and accomodation Neck: supple, no JVD. No cervical lymphadenopathy.  Chest:Good air entry bilaterally, no added sounds  CVS: S1 S2 regular, no murmurs.  Abdomen: Bowel sounds present, Non tender and not distended with no gaurding, rigidity or rebound. Extremities: B/L Lower Ext shows no edema, both legs are warm to touch Neurology: Awake alert, and oriented X 3, CN II-XII intact, Non focal Skin:No Rash  Data Review Lab Results  Component Value Date   HGBA1C 5.8 09/17/2014     Assessment & Plan   1. Essential hypertension, benign: Patient declined to be on medication, did not take the hydrochlorothiazide prescribed, she claimed she is not hypertensive but her BP is white-coat related, she did not bring ambulatory readings  We have discussed target BP range and blood pressure goal. I have advised patient to check BP regularly and to call us back or report  to clinic if the numbers are consistently higher than 140/90. We discussed the importance of compliance with medical therapy and DASH diet recommended, consequences of uncontrolled hypertension discussed.   - continue current BP medications  2. Dyslipidemia  - simvastatin (ZOCOR) 20 MG tablet; Take 1 tablet (20 mg total) by mouth at bedtime.  Dispense: 90 tablet; Refill: 3  To address this please limit saturated fat to no more than 7% of your calories, limit cholesterol to 200 mg/day, increase fiber and  exercise as tolerated. If needed we may add another cholesterol lowering medication to your regimen.   3. Chronic pain disorder  - diclofenac (VOLTAREN) 50 MG EC tablet; Take 1 tablet (50 mg total) by mouth 2 (two) times daily as needed.  Dispense: 60 tablet; Refill: 3  4. Fibromyalgia  - amitriptyline (ELAVIL) 50 MG tablet; Take 1 tablet (50 mg total) by mouth at bedtime.  Dispense: 90 tablet; Refill: 3  5. Encounter for screening mammogram for breast cancer  - MM Digital Diagnostic Bilat; Future   Patient have been counseled extensively about nutrition and exercise  Return in about 6 months (around 02/13/2016) for Follow up HTN, Annual Physical.  The patient was given clear instructions to go to ER or return to medical center if symptoms don't improve, worsen or new problems develop. The patient verbalized understanding. The patient was told to call to get lab results if they haven't heard anything in the next week.   This note has been created with Surveyor, quantity. Any transcriptional errors are unintentional.    Angelica Chessman, MD, [place], [place], [place], [place] and [place] [place], [place]   08/15/2015, 5:42 PM

## 2015-08-15 NOTE — Progress Notes (Signed)
Patientt is here to follow-up for BP.  Patient states she does not have high BP and has not been taking HCTZ. Patient needs Amitriptyline,  Patient complains of left ear itching and something "walking in there" Feet hot and feel like pin and needle after laying down.

## 2015-11-11 ENCOUNTER — Encounter: Payer: Self-pay | Admitting: Internal Medicine

## 2015-11-11 ENCOUNTER — Ambulatory Visit: Payer: 59 | Attending: Internal Medicine | Admitting: Internal Medicine

## 2015-11-11 VITALS — BP 148/75 | HR 78 | Temp 98.2°F | Resp 18 | Ht 60.0 in | Wt 145.8 lb

## 2015-11-11 DIAGNOSIS — Z0000 Encounter for general adult medical examination without abnormal findings: Secondary | ICD-10-CM | POA: Diagnosis not present

## 2015-11-11 DIAGNOSIS — Z79899 Other long term (current) drug therapy: Secondary | ICD-10-CM | POA: Diagnosis not present

## 2015-11-11 DIAGNOSIS — I10 Essential (primary) hypertension: Secondary | ICD-10-CM | POA: Insufficient documentation

## 2015-11-11 DIAGNOSIS — E785 Hyperlipidemia, unspecified: Secondary | ICD-10-CM | POA: Diagnosis not present

## 2015-11-11 DIAGNOSIS — M797 Fibromyalgia: Secondary | ICD-10-CM | POA: Diagnosis not present

## 2015-11-11 LAB — LIPID PANEL
CHOLESTEROL: 221 mg/dL — AB (ref 125–200)
HDL: 57 mg/dL (ref >=46)
LDL CALC: 139 mg/dL — AB (ref <130)
TRIGLYCERIDES: 123 mg/dL (ref <150)
Total CHOL/HDL Ratio: 3.9 Ratio (ref <=5.0)
VLDL: 25 mg/dL (ref <30)

## 2015-11-11 LAB — COMPLETE METABOLIC PANEL WITH GFR
ALT: 12 U/L (ref 6–29)
AST: 19 U/L (ref 10–35)
Albumin: 4.2 g/dL (ref 3.6–5.1)
Alkaline Phosphatase: 59 U/L (ref 33–130)
BILIRUBIN TOTAL: 0.5 mg/dL (ref 0.2–1.2)
BUN: 11 mg/dL (ref 7–25)
CALCIUM: 9.7 mg/dL (ref 8.6–10.4)
CHLORIDE: 104 mmol/L (ref 98–110)
CO2: 27 mmol/L (ref 20–31)
CREATININE: 0.74 mg/dL (ref 0.50–1.05)
GFR, Est Non African American: 89 mL/min (ref >=60)
Glucose, Bld: 88 mg/dL (ref 65–99)
Potassium: 4.8 mmol/L (ref 3.5–5.3)
Sodium: 141 mmol/L (ref 135–146)
TOTAL PROTEIN: 7.3 g/dL (ref 6.1–8.1)

## 2015-11-11 MED ORDER — AMITRIPTYLINE HCL 50 MG PO TABS: 50.0000 mg | ORAL_TABLET | Freq: Every day | ORAL | Status: DC

## 2015-11-11 MED ORDER — SIMVASTATIN 20 MG PO TABS: 20.0000 mg | ORAL_TABLET | Freq: Every day | ORAL | Status: DC

## 2015-11-11 NOTE — Progress Notes (Signed)
Patient ID: Chelsey Wilson, female   DOB: 09/12/1957, 50 y.o.   MRN: [id number]   Chelsey Wilson, is a 50 y.o. female  [id number]  [id number]  DOB - 09/12/1957  Chief Complaint  Patient presents with  . Annual Exam    work physical        Subjective:   Chelsey Wilson is a 50 y.o. female with history of elevated blood pressure, dyslipidemia and fibromyalgia here today for an insurance physical examination. She has no complaints. She is required to have her cholesterol panel done as well as blood sugar. Patient is still in denial of being hypertensive, she declined any medications for hypertension. She claims her elevated blood pressures due to working at night and not having good sleep before coming to the clinic. She claims her blood pressure is controlled otherwise. She needs refill on her medications for cholesterol and fibromyalgia. Patient has No headache, No chest pain, No abdominal pain - No Nausea, No new weakness tingling or numbness, No Cough - SOB.  Problem  Fibromyalgia  Healthcare Maintenance    ALLERGIES: No Known Allergies  PAST MEDICAL HISTORY: History reviewed. No pertinent past medical history.  MEDICATIONS AT HOME: Prior to Admission medications   Medication Sig Start Date End Date Taking? Authorizing Provider  amitriptyline (ELAVIL) 50 MG tablet Take 1 tablet (50 mg total) by mouth at bedtime. 11/11/15  Yes Tresa Garter, MD  diclofenac (VOLTAREN) 50 MG EC tablet Take 1 tablet (50 mg total) by mouth 2 (two) times daily as needed. 08/15/15  Yes Tresa Garter, MD  OVER THE COUNTER MEDICATION Take 2 tablets by mouth every 8 (eight) hours as needed. Store brand sinus and head ache relief   Yes Historical Provider, MD  simvastatin (ZOCOR) 20 MG tablet Take 1 tablet (20 mg total) by mouth at bedtime. 11/11/15  Yes Tresa Garter, MD  docusate sodium 100 MG CAPS Take 100 mg by mouth 2 (two) times daily as needed for constipation. Patient not taking:  Reported on 11/11/2015 08/04/13   Ripudeep Krystal Eaton, MD  hydrochlorothiazide (HYDRODIURIL) 25 MG tablet Take 1 tablet (25 mg total) by mouth daily. Patient not taking: Reported on 08/15/2015 03/18/15   Tresa Garter, MD     Objective:   Filed Vitals:   11/11/15 1102  BP: 148/75  Pulse: 78  Temp: 98.2 F (36.8 C)  TempSrc: Oral  Resp: 18  Height: 5' (1.524 m)  Weight: 145 lb 12.8 oz (66.134 kg)  SpO2: 100%    Exam General appearance : Awake, alert, not in any distress. Speech Clear. Not toxic looking HEENT: Atraumatic and Normocephalic, pupils equally reactive to light and accomodation Neck: supple, no JVD. No cervical lymphadenopathy.  Chest:Good air entry bilaterally, no added sounds  CVS: S1 S2 regular, no murmurs.  Abdomen: Bowel sounds present, Non tender and not distended with no gaurding, rigidity or rebound. Extremities: B/L Lower Ext shows no edema, both legs are warm to touch Neurology: Awake alert, and oriented X 3, CN II-XII intact, Non focal Skin:No Rash  Data Review Lab Results  Component Value Date   HGBA1C 5.8 09/17/2014     Assessment & Plan   1. Healthcare maintenance  - Flu Vaccine QUAD 36+ mos PF IM (Fluarix & Fluzone Quad PF) - COMPLETE METABOLIC PANEL WITH GFR - Glucose (CBG)  2. Dyslipidemia  - Lipid panel - simvastatin (ZOCOR) 20 MG tablet; Take 1 tablet (20 mg total) by mouth at bedtime.  Dispense: 90 tablet;  Refill: 3 To address this please limit saturated fat to no more than 7% of your calories, limit cholesterol to 200 mg/day, increase fiber and exercise as tolerated. If needed we may add another cholesterol lowering medication to your regimen.   3. Essential hypertension, benign We have discussed target BP range and blood pressure goal. I have advised patient to check BP regularly and to call us back or report to clinic if the numbers are consistently higher than 140/90. We discussed the importance of compliance with medical therapy  and DASH diet recommended, consequences of uncontrolled hypertension discussed.   - continue current BP medications  4. Fibromyalgia  - amitriptyline (ELAVIL) 50 MG tablet; Take 1 tablet (50 mg total) by mouth at bedtime.  Dispense: 90 tablet; Refill: 3  Patient have been counseled extensively about nutrition and exercise  Return in about 6 months (around 05/10/2016) for Follow up HTN, Follow up Pain and comorbidities.  The patient was given clear instructions to go to ER or return to medical center if symptoms don't improve, worsen or new problems develop. The patient verbalized understanding. The patient was told to call to get lab results if they haven't heard anything in the next week.   This note has been created with Surveyor, quantity. Any transcriptional errors are unintentional.    Angelica Chessman, MD, [place], Karilyn Cota, [place] [facility] [place], [place]   11/11/2015, 11:34 AM

## 2015-11-11 NOTE — Progress Notes (Signed)
Patient her for Work Physcial  Patient denies pain at this time.  Patient is in denial about High BP. Patient is not taking BP medication. 1.147/83 2.148/75  Patient tolerated Flu shot well.

## 2015-11-11 NOTE — Patient Instructions (Addendum)
DASH Eating Plan DASH stands for "Dietary Approaches to Stop Hypertension." The DASH eating plan is a healthy eating plan that has been shown to reduce high blood pressure (hypertension). Additional health benefits may include reducing the risk of type 2 diabetes mellitus, heart disease, and stroke. The DASH eating plan may also help with weight loss. WHAT DO I NEED TO KNOW ABOUT THE DASH EATING PLAN? For the DASH eating plan, you will follow these general guidelines:  Choose foods with a percent daily value for sodium of less than 5% (as listed on the food label).  Use salt-free seasonings or herbs instead of table salt or sea salt.  Check with your health care provider or pharmacist before using salt substitutes.  Eat lower-sodium products, often labeled as "lower sodium" or "no salt added."  Eat fresh foods.  Eat more vegetables, fruits, and low-fat dairy products.  Choose whole grains. Look for the word "whole" as the first word in the ingredient list.  Choose fish and skinless chicken or turkey more often than red meat. Limit fish, poultry, and meat to 6 oz (170 g) each day.  Limit sweets, desserts, sugars, and sugary drinks.  Choose heart-healthy fats.  Limit cheese to 1 oz (28 g) per day.  Eat more home-cooked food and less restaurant, buffet, and fast food.  Limit fried foods.  Cook foods using methods other than frying.  Limit canned vegetables. If you do use them, rinse them well to decrease the sodium.  When eating at a restaurant, ask that your food be prepared with less salt, or no salt if possible. WHAT FOODS CAN I EAT? Seek help from a dietitian for individual calorie needs. Grains Whole grain or whole wheat bread. Brown rice. Whole grain or whole wheat pasta. Quinoa, bulgur, and whole grain cereals. Low-sodium cereals. Corn or whole wheat flour tortillas. Whole grain cornbread. Whole grain crackers. Low-sodium crackers. Vegetables Fresh or frozen vegetables  (raw, steamed, roasted, or grilled). Low-sodium or reduced-sodium tomato and vegetable juices. Low-sodium or reduced-sodium tomato sauce and paste. Low-sodium or reduced-sodium canned vegetables.  Fruits All fresh, canned (in natural juice), or frozen fruits. Meat and Other Protein Products Ground beef (85% or leaner), grass-fed beef, or beef trimmed of fat. Skinless chicken or turkey. Ground chicken or turkey. Pork trimmed of fat. All fish and seafood. Eggs. Dried beans, peas, or lentils. Unsalted nuts and seeds. Unsalted canned beans. Dairy Low-fat dairy products, such as skim or 1% milk, 2% or reduced-fat cheeses, low-fat ricotta or cottage cheese, or plain low-fat yogurt. Low-sodium or reduced-sodium cheeses. Fats and Oils Tub margarines without trans fats. Light or reduced-fat mayonnaise and salad dressings (reduced sodium). Avocado. Safflower, olive, or canola oils. Natural peanut or almond butter. Other Unsalted popcorn and pretzels. The items listed above may not be a complete list of recommended foods or beverages. Contact your dietitian for more options. WHAT FOODS ARE NOT RECOMMENDED? Grains White bread. White pasta. White rice. Refined cornbread. Bagels and croissants. Crackers that contain trans fat. Vegetables Creamed or fried vegetables. Vegetables in a cheese sauce. Regular canned vegetables. Regular canned tomato sauce and paste. Regular tomato and vegetable juices. Fruits Dried fruits. Canned fruit in light or heavy syrup. Fruit juice. Meat and Other Protein Products Fatty cuts of meat. Ribs, chicken wings, bacon, sausage, bologna, salami, chitterlings, fatback, hot dogs, bratwurst, and packaged luncheon meats. Salted nuts and seeds. Canned beans with salt. Dairy Whole or 2% milk, cream, half-and-half, and cream cheese. Whole-fat or sweetened yogurt. Full-fat   cheeses or blue cheese. Nondairy creamers and whipped toppings. Processed cheese, cheese spreads, or cheese  curds. Condiments Onion and garlic salt, seasoned salt, table salt, and sea salt. Canned and packaged gravies. Worcestershire sauce. Tartar sauce. Barbecue sauce. Teriyaki sauce. Soy sauce, including reduced sodium. Steak sauce. Fish sauce. Oyster sauce. Cocktail sauce. Horseradish. Ketchup and mustard. Meat flavorings and tenderizers. Bouillon cubes. Hot sauce. Tabasco sauce. Marinades. Taco seasonings. Relishes. Fats and Oils Butter, stick margarine, lard, shortening, ghee, and bacon fat. Coconut, palm kernel, or palm oils. Regular salad dressings. Other Pickles and olives. Salted popcorn and pretzels. The items listed above may not be a complete list of foods and beverages to avoid. Contact your dietitian for more information. WHERE CAN I FIND MORE INFORMATION? National Heart, Lung, and Blood Institute: travelstabloid.com   This information is not intended to replace advice given to you by your health care provider. Make sure you discuss any questions you have with your health care provider.   Document Released: 10/01/2011 Document Revised: 11/02/2014 Document Reviewed: 08/16/2013 Elsevier Interactive Patient Education 2016 Elsevier Inc. Dyslipidemia Dyslipidemia is an imbalance of the lipids in your blood. Lipids are waxy, fat-like proteins that your body needs in small amounts. Dyslipidemia often involves the lipids cholesterol or triglycerides. Common forms of dyslipidemia are:  High levels of bad cholesterol (LDL cholesterol). LDL cholesterol is the type of cholesterol that causes heart disease.  Low levels of good cholesterol (HDL cholesterol). HDL cholesterol is the type of cholesterol that helps protect against heart disease.  High levels of triglycerides. Triglycerides are a fatty substance in the blood linked to a buildup of plaque on your arteries. RISK FACTORS  Increased age.  Having a family history of high cholesterol.  Certain medicines,  including birth control pills, diuretics, beta-blockers, and some medicines for depression.  Smoking.  Eating a high-fat diet.  Being overweight.  Medical conditions such as diabetes, polycystic ovary syndrome, pregnancy, kidney disease, and hypothyroidism.  Lack of regular exercise. SIGNS AND SYMPTOMS There are no signs or symptoms with dyslipidemia. DIAGNOSIS A simple blood test called a fasting blood test can be done to determine your level of:  Total cholesterol. This is the combined number of LDL cholesterol and HDL cholesterol. A healthy number is lower than 200.  LDL cholesterol. The goal number for LDL cholesterol is different for each person depending on risk factors. Ask your health care provider what your LDL cholesterol number should be.  HDL cholesterol. A healthy level of HDL cholesterol is 60 or higher. A number lower than 40 for men or 50 for women is a danger sign.  Triglycerides. A healthy triglyceride number is less than 150. TREATMENT Dyslipidemia is a treatable condition. Your health care provider will advise you on what type of treatment is best based on your age, your test results, and current guidelines. Treatment may include:  Dietary changes. A dietitian may help you create a diet that is based on your risk factors, conditions, and lifestyle.  Regular exercise. This can help lower your LDL cholesterol, raise your HDL cholesterol, and help with weight management. Check with your health care provider before beginning an exercise program. Most people should participate in 30 minutes of brisk exercise 5 days a week.  Quitting smoking.  Medicines to lower LDL cholesterol and triglycerides.  If you have high levels of triglycerides, your health care provider may:  Have you stop drinking alcohol.  Have you restrict your fat intake.  Have you eliminate refined sugars from  your diet.  Treat you for other conditions, such as underactive thyroid gland  (hypothyroidism) and high blood sugar (hyperglycemia). Your health care provider will monitor your lipid levels with regular blood tests. HOME CARE INSTRUCTIONS  Eat a healthy diet. Follow any diet instructions if they were given to you by your health care provider.  Maintain a healthy weight.  Exercise regularly based on the recommendations of your health care provider.  Do not use any tobacco products, including cigarettes, chewing tobacco, or electronic cigarettes.  Take medicines only as directed by your health care provider.  Keep all follow-up visits as directed by your health care provider. SEEK MEDICAL CARE IF: You are having possible side effects from your medicines.   This information is not intended to replace advice given to you by your health care provider. Make sure you discuss any questions you have with your health care provider.   Document Released: 10/17/2013 Document Revised: 11/02/2014 Document Reviewed: 10/17/2013 Elsevier Interactive Patient Education [employer].

## 2015-11-12 ENCOUNTER — Telehealth: Payer: Self-pay | Admitting: *Deleted

## 2015-11-12 NOTE — Telephone Encounter (Signed)
Patient verified DOB Patient informed of lab results being normal. Though patients cholesterol is high, the result is better than the previous one. Patient advised to continue cholesterol medication and limit her saturated fats to 7% and cholesterol to 200 mg/day. Patient also advised to increase fiber and exercise. Patient informed of paperwork being completed since lab results are back and being faxed to her insurance. Patient expressed her understanding and had no further questions.

## 2015-11-12 NOTE — Telephone Encounter (Signed)
-----   Message from Tresa Garter, MD sent at 11/12/2015  4:18 PM EST ----- Please inform patient that her laboratory studies normal, cholesterol is slightly better than previous result. Continue cholesterol medication and also please limit saturated fat to no more than 7% of your calories, limit cholesterol to 200 mg/day, increase fiber and exercise as tolerated.

## 2016-05-12 ENCOUNTER — Encounter: Payer: Self-pay | Admitting: Internal Medicine

## 2016-05-12 ENCOUNTER — Ambulatory Visit: Payer: 59 | Attending: Internal Medicine | Admitting: Internal Medicine

## 2016-05-12 VITALS — BP 158/93 | HR 83 | Temp 98.1°F | Resp 16 | Ht 61.0 in | Wt 150.4 lb

## 2016-05-12 DIAGNOSIS — E785 Hyperlipidemia, unspecified: Secondary | ICD-10-CM | POA: Insufficient documentation

## 2016-05-12 DIAGNOSIS — Z79899 Other long term (current) drug therapy: Secondary | ICD-10-CM | POA: Diagnosis not present

## 2016-05-12 DIAGNOSIS — D239 Other benign neoplasm of skin, unspecified: Secondary | ICD-10-CM

## 2016-05-12 DIAGNOSIS — Z1159 Encounter for screening for other viral diseases: Secondary | ICD-10-CM

## 2016-05-12 DIAGNOSIS — D367 Benign neoplasm of other specified sites: Secondary | ICD-10-CM | POA: Insufficient documentation

## 2016-05-12 DIAGNOSIS — I10 Essential (primary) hypertension: Secondary | ICD-10-CM | POA: Insufficient documentation

## 2016-05-12 DIAGNOSIS — Z23 Encounter for immunization: Secondary | ICD-10-CM

## 2016-05-12 DIAGNOSIS — J028 Acute pharyngitis due to other specified organisms: Secondary | ICD-10-CM | POA: Insufficient documentation

## 2016-05-12 DIAGNOSIS — Z114 Encounter for screening for human immunodeficiency virus [HIV]: Secondary | ICD-10-CM

## 2016-05-12 DIAGNOSIS — M797 Fibromyalgia: Secondary | ICD-10-CM | POA: Insufficient documentation

## 2016-05-12 DIAGNOSIS — D229 Melanocytic nevi, unspecified: Secondary | ICD-10-CM | POA: Insufficient documentation

## 2016-05-12 DIAGNOSIS — J029 Acute pharyngitis, unspecified: Secondary | ICD-10-CM

## 2016-05-12 MED ORDER — AMITRIPTYLINE HCL 50 MG PO TABS: 50.0000 mg | ORAL_TABLET | Freq: Every day | ORAL | Status: DC

## 2016-05-12 MED ORDER — HYDROCHLOROTHIAZIDE 25 MG PO TABS: 25.0000 mg | ORAL_TABLET | Freq: Every day | ORAL | Status: DC

## 2016-05-12 MED ORDER — SIMVASTATIN 20 MG PO TABS: 20.0000 mg | ORAL_TABLET | Freq: Every day | ORAL | Status: DC

## 2016-05-12 MED ORDER — LORATADINE 10 MG PO TABS: 10.0000 mg | ORAL_TABLET | Freq: Every day | ORAL | Status: DC

## 2016-05-12 MED ORDER — ZOSTER VACCINE LIVE 19400 UNT/0.65ML ~~LOC~~ SUSR: 0.6500 mL | Freq: Once | SUBCUTANEOUS | Status: DC

## 2016-05-12 NOTE — Progress Notes (Signed)
Chelsey Wilson, is a 50 y.o. female  [id number]  [id number]  DOB - 09/12/1957  CC:  Chief Complaint  Patient presents with  . Establish Care       HPI: Chelsey Wilson is a 50 y.o. female (per pt, wrong ppwk, she is actually 49yo) here today to establish medical care, w/ hx of htn (which she continues to deny and not taking hctz 25), fibromyalgia and hld, last seen in clinic 1/17. Per pt, her body aches/arm weakness are improved.  She c/o of itchy eyes and sore throat (left > right pharyx) x 2-3 wks, w/ intermittent productive yellow phelgm production.  Denies cough/f/c/sob/pnd/orthopnea.  Patient has No headache, No chest pain, No abdominal pain - No Nausea, No new weakness tingling or numbness, No Cough - SOB.  Denies hx of prior allergies, denies gi sx/heartburn. No weightloss/gain. bms normal.  Does not smoke /drink etoh  Review of Systems: Per HPI, o/w all systems reviewed negative.  No Known Allergies History reviewed. No pertinent past medical history. Current Outpatient Prescriptions on File Prior to Visit  Medication Sig Dispense Refill  . docusate sodium 100 MG CAPS Take 100 mg by mouth 2 (two) times daily as needed for constipation. 60 capsule 0  . OVER THE COUNTER MEDICATION Take 2 tablets by mouth every 8 (eight) hours as needed. Store brand sinus and head ache relief    . [DISCONTINUED] promethazine (PHENERGAN) 12.5 MG tablet Take 1 tablet (12.5 mg total) by mouth every 6 (six) hours as needed for nausea. 30 tablet 1   No current facility-administered medications on file prior to visit.   Family History  Problem Relation Age of Onset  . Other Neg Hx    Social History   Social History  . Marital Status: Single    Spouse Name: N/A  . Number of Children: N/A  . Years of Education: N/A   Occupational History  . Not on file.   Social History Main Topics  . Smoking status: Never Smoker   . Smokeless tobacco: Never Used  . Alcohol Use: No  . Drug  Use: No  . Sexual Activity: No   Other Topics Concern  . Not on file   Social History Narrative    Objective:   Filed Vitals:   05/12/16 0911  BP: 158/93  Pulse: 83  Temp: 98.1 F (36.7 C)  Resp: 16    Filed Weights   05/12/16 0911  Weight: 150 lb 6.4 oz (68.221 kg)    BP Readings from Last 3 Encounters:  05/12/16 158/93  11/11/15 148/75  08/15/15 139/92    Physical Exam: Constitutional: Patient appears well-developed and well-nourished. No distress. AAOx3 HENT: Normocephalic, atraumatic, External right and left ear normal. Oropharynx is clear and moist, no enlarged tonsils/exudate. bilat TMs clear. No cobblestoning in [place]. Eyes: Conjunctivae and EOM are normal. PERRL, no scleral icterus. Neck: Normal ROM. Neck supple. No JVD. No tracheal deviation. No thyromegaly. CVS: RRR, S1/S2 +, no murmurs, no gallops, no carotid bruit.  Pulmonary: Effort and breath sounds normal, no stridor, rhonchi, wheezes, rales.  Abdominal: Soft. BS +, no distension, tenderness, rebound or guarding.  Musculoskeletal: Normal range of motion. No edema and no tenderness.  LE: bilat/ no c/c/e, pulses 2+ bilateral.  Large bluish nevi on right great toe, initial thought from trauma, but there for "years per pt". Neuro: Alert. muscle tone coordination wnl. No cranial nerve deficit grossly. Skin: Skin is warm and dry. No rash noted. Not diaphoretic. No erythema.  No pallor. Psychiatric: Normal mood and affect. Behavior, judgment, thought content normal.  Lab Results  Component Value Date   WBC 5.2 09/17/2014   HGB 11.7* 09/17/2014   HCT 35.5* 09/17/2014   MCV 81.4 09/17/2014   PLT 237 09/17/2014   Lab Results  Component Value Date   CREATININE 0.74 11/11/2015   BUN 11 11/11/2015   NA 141 11/11/2015   K 4.8 11/11/2015   CL 104 11/11/2015   CO2 27 11/11/2015    Lab Results  Component Value Date   HGBA1C 5.8 09/17/2014   Lipid Panel     Component Value Date/Time   CHOL 221*  11/11/2015 1149   TRIG 123 11/11/2015 1149   HDL 57 11/11/2015 1149   CHOLHDL 3.9 11/11/2015 1149   VLDL 25 11/11/2015 1149   LDLCALC 139* 11/11/2015 1149       Depression screen PHQ 2/9 05/12/2016 11/11/2015 08/15/2015 03/18/2015 03/28/2014  Decreased Interest 0 0 0 0 0  Down, Depressed, Hopeless 0 0 0 0 0  PHQ - 2 Score 0 0 0 0 0    Assessment and plan:   1. Essential hypertension, benign dw pt at length re: dx of htn, goal <140/90, recd DASH diet, dw complications of long term untreated htn - recd takes hctz - hydrochlorothiazide (HYDRODIURIL) 25 MG tablet; Take 1 tablet (25 mg total) by mouth daily.  Dispense: 90 tablet; Refill: 3  2. Fibromyalgia Improving, renewed - amitriptyline (ELAVIL) 50 MG tablet; Take 1 tablet (50 mg total) by mouth at bedtime.  Dispense: 90 tablet; Refill: 3  3. Dyslipidemia - simvastatin (ZOCOR) 20 MG tablet; Take 1 tablet (20 mg total) by mouth at bedtime.  Dispense: 90 tablet; Refill: 3  4. Atypical nevi, right great toe - Ambulatory referral to Podiatry  5. Mild pharyngitis, likely viral - possibly atypical allergic rhinitis (itchy eyes as well) vs silent reflux - recd gargle w/ warm salt water - trial claritin  6. Need for hepatitis C screening test - Hepatitis C antibody  7. Screening for HIV (human immunodeficiency virus) - HIV antibody (with reflex)  8. Health maintanance - pt is 50 years old, not due for MM for 2 more years. - pap smear needed, asked her to make appt w/ me when can. - tdap today. Return in about 4 weeks (around 06/09/2016) for pap smear.  The patient was given clear instructions to go to ER or return to medical center if symptoms don't improve, worsen or new problems develop. The patient verbalized understanding. The patient was told to call to get lab results if they haven't heard anything in the next week.    This note has been created with Surveyor, quantity. Any  transcriptional errors are unintentional.   Maren Reamer, MD, [place], [place]   05/12/2016, 9:41 AM

## 2016-05-12 NOTE — Patient Instructions (Addendum)
It was a pleasure seeing you today.  Signup for MyChart ! We can then communicate faster and easier with each other via e-mail.  DASH Eating Plan DASH stands for "Dietary Approaches to Stop Hypertension." The DASH eating plan is a healthy eating plan that has been shown to reduce high blood pressure (hypertension). Additional health benefits may include reducing the risk of type 2 diabetes mellitus, heart disease, and stroke. The DASH eating plan may also help with weight loss. WHAT DO I NEED TO KNOW ABOUT THE DASH EATING PLAN? For the DASH eating plan, you will follow these general guidelines:  Choose foods with a percent daily value for sodium of less than 5% (as listed on the food label).  Use salt-free seasonings or herbs instead of table salt or sea salt.  Check with your health care provider or pharmacist before using salt substitutes.  Eat lower-sodium products, often labeled as "lower sodium" or "no salt added."  Eat fresh foods.  Eat more vegetables, fruits, and low-fat dairy products.  Choose whole grains. Look for the word "whole" as the first word in the ingredient list.  Choose fish and skinless chicken or [place] more often than red meat. Limit fish, poultry, and meat to 6 oz (170 g) each day.  Limit sweets, desserts, sugars, and sugary drinks.  Choose heart-healthy fats.  Limit cheese to 1 oz (28 g) per day.  Eat more home-cooked food and less restaurant, buffet, and fast food.  Limit fried foods.  Cook foods using methods other than frying.  Limit canned vegetables. If you do use them, rinse them well to decrease the sodium.  When eating at a restaurant, ask that your food be prepared with less salt, or no salt if possible. WHAT FOODS CAN I EAT? Seek help from a dietitian for individual calorie needs. Grains Whole grain or whole wheat bread. Brown rice. Whole grain or whole wheat pasta. Quinoa, bulgur, and whole grain cereals. Low-sodium cereals. Corn or  whole wheat flour tortillas. Whole grain cornbread. Whole grain crackers. Low-sodium crackers. Vegetables Fresh or frozen vegetables (raw, steamed, roasted, or grilled). Low-sodium or reduced-sodium tomato and vegetable juices. Low-sodium or reduced-sodium tomato sauce and paste. Low-sodium or reduced-sodium canned vegetables.  Fruits All fresh, canned (in natural juice), or frozen fruits. Meat and Other Protein Products Ground beef (85% or leaner), grass-fed beef, or beef trimmed of fat. Skinless chicken or [place]. Ground chicken or [place]. Pork trimmed of fat. All fish and seafood. Eggs. Dried beans, peas, or lentils. Unsalted nuts and seeds. Unsalted canned beans. Dairy Low-fat dairy products, such as skim or 1% milk, 2% or reduced-fat cheeses, low-fat ricotta or cottage cheese, or plain low-fat yogurt. Low-sodium or reduced-sodium cheeses. Fats and Oils Tub margarines without trans fats. Light or reduced-fat mayonnaise and salad dressings (reduced sodium). Avocado. Safflower, olive, or canola oils. Natural peanut or almond butter. Other Unsalted popcorn and pretzels. The items listed above may not be a complete list of recommended foods or beverages. Contact your dietitian for more options. WHAT FOODS ARE NOT RECOMMENDED? Grains White bread. White pasta. White rice. Refined cornbread. Bagels and croissants. Crackers that contain trans fat. Vegetables Creamed or fried vegetables. Vegetables in a cheese sauce. Regular canned vegetables. Regular canned tomato sauce and paste. Regular tomato and vegetable juices. Fruits Dried fruits. Canned fruit in light or heavy syrup. Fruit juice. Meat and Other Protein Products Fatty cuts of meat. Ribs, chicken wings, bacon, sausage, bologna, salami, chitterlings, fatback, hot dogs, bratwurst, and packaged  luncheon meats. Salted nuts and seeds. Canned beans with salt. Dairy Whole or 2% milk, cream, half-and-half, and cream cheese. Whole-fat or sweetened  yogurt. Full-fat cheeses or blue cheese. Nondairy creamers and whipped toppings. Processed cheese, cheese spreads, or cheese curds. Condiments Onion and garlic salt, seasoned salt, table salt, and sea salt. Canned and packaged gravies. Worcestershire sauce. Tartar sauce. Barbecue sauce. Teriyaki sauce. Soy sauce, including reduced sodium. Steak sauce. Fish sauce. Oyster sauce. Cocktail sauce. Horseradish. Ketchup and mustard. Meat flavorings and tenderizers. Bouillon cubes. Hot sauce. Tabasco sauce. Marinades. Taco seasonings. Relishes. Fats and Oils Butter, stick margarine, lard, shortening, ghee, and bacon fat. Coconut, palm kernel, or palm oils. Regular salad dressings. Other Pickles and olives. Salted popcorn and pretzels. The items listed above may not be a complete list of foods and beverages to avoid. Contact your dietitian for more information. WHERE CAN I FIND MORE INFORMATION? National Heart, Lung, and Blood Institute: travelstabloid.com   This information is not intended to replace advice given to you by your health care provider. Make sure you discuss any questions you have with your health care provider.   Document Released: 10/01/2011 Document Revised: 11/02/2014 Document Reviewed: 08/16/2013 Elsevier Interactive Patient Education 2016 Elsevier Inc.  - Hypertension Hypertension is another name for high blood pressure. High blood pressure forces your heart to work harder to pump blood. A blood pressure reading has two numbers, which includes a higher number over a lower number (example: 110/72). HOME CARE   Have your blood pressure rechecked by your doctor.  Only take medicine as told by your doctor. Follow the directions carefully. The medicine does not work as well if you skip doses. Skipping doses also puts you at risk for problems.  Do not smoke.  Monitor your blood pressure at home as told by your doctor. GET HELP IF:  You think you  are having a reaction to the medicine you are taking.  You have repeat headaches or feel dizzy.  You have puffiness (swelling) in your ankles.  You have trouble with your vision. GET HELP RIGHT AWAY IF:   You get a very bad headache and are confused.  You feel weak, numb, or faint.  You get chest or belly (abdominal) pain.  You throw up (vomit).  You cannot breathe very well. MAKE SURE YOU:   Understand these instructions.  Will watch your condition.  Will get help right away if you are not doing well or get worse.   This information is not intended to replace advice given to you by your health care provider. Make sure you discuss any questions you have with your health care provider.   Document Released: 03/30/2008 Document Revised: 10/17/2013 Document Reviewed: 08/04/2013 Elsevier Interactive Patient Education 2016 Elsevier Inc.  - Pharyngitis Pharyngitis is a sore throat (pharynx). There is redness, pain, and swelling of your throat. HOME CARE   Drink enough fluids to keep your pee (urine) clear or pale yellow.  Only take medicine as told by your doctor.  You may get sick again if you do not take medicine as told. Finish your medicines, even if you start to feel better.  Do not take aspirin.  Rest.  Rinse your mouth (gargle) with salt water ( tsp of salt per 1 qt of water) every 1-2 hours. This will help the pain.  If you are not at risk for choking, you can suck on hard candy or sore throat lozenges. GET HELP IF:  You have large, tender lumps on  your neck.  You have a rash.  You cough up green, yellow-brown, or bloody spit. GET HELP RIGHT AWAY IF:   You have a stiff neck.  You drool or cannot swallow liquids.  You throw up (vomit) or are not able to keep medicine or liquids down.  You have very bad pain that does not go away with medicine.  You have problems breathing (not from a stuffy nose). MAKE SURE YOU:   Understand these  instructions.  Will watch your condition.  Will get help right away if you are not doing well or get worse.   This information is not intended to replace advice given to you by your health care provider. Make sure you discuss any questions you have with your health care provider.   Document Released: 03/30/2008 Document Revised: 08/02/2013 Document Reviewed: 06/19/2013 Elsevier Interactive Patient Education 2016 Elsevier Inc. - Allergic Rhinitis Allergic rhinitis is when the mucous membranes in the nose respond to allergens. Allergens are particles in the air that cause your body to have an allergic reaction. This causes you to release allergic antibodies. Through a chain of events, these eventually cause you to release histamine into the blood stream. Although meant to protect the body, it is this release of histamine that causes your discomfort, such as frequent sneezing, congestion, and an itchy, runny nose.  CAUSES Seasonal allergic rhinitis (hay fever) is caused by pollen allergens that may come from grasses, trees, and weeds. Year-round allergic rhinitis (perennial allergic rhinitis) is caused by allergens such as house dust mites, pet dander, and mold spores. SYMPTOMS  Nasal stuffiness (congestion).  Itchy, runny nose with sneezing and tearing of the eyes. DIAGNOSIS Your health care provider can help you determine the allergen or allergens that trigger your symptoms. If you and your health care provider are unable to determine the allergen, skin or blood testing may be used. Your health care provider will diagnose your condition after taking your health history and performing a physical exam. Your health care provider may assess you for other related conditions, such as asthma, pink eye, or an ear infection. TREATMENT Allergic rhinitis does not have a cure, but it can be controlled by:  Medicines that block allergy symptoms. These may include allergy shots, nasal sprays, and oral  antihistamines.  Avoiding the allergen. Hay fever may often be treated with antihistamines in pill or nasal spray forms. Antihistamines block the effects of histamine. There are over-the-counter medicines that may help with nasal congestion and swelling around the eyes. Check with your health care provider before taking or giving this medicine. If avoiding the allergen or the medicine prescribed do not work, there are many new medicines your health care provider can prescribe. Stronger medicine may be used if initial measures are ineffective. Desensitizing injections can be used if medicine and avoidance does not work. Desensitization is when a patient is given ongoing shots until the body becomes less sensitive to the allergen. Make sure you follow up with your health care provider if problems continue. HOME CARE INSTRUCTIONS It is not possible to completely avoid allergens, but you can reduce your symptoms by taking steps to limit your exposure to them. It helps to know exactly what you are allergic to so that you can avoid your specific triggers. SEEK MEDICAL CARE IF:  You have a fever.  You develop a cough that does not stop easily (persistent).  You have shortness of breath.  You start wheezing.  Symptoms interfere with normal daily activities.  This information is not intended to replace advice given to you by your health care provider. Make sure you discuss any questions you have with your health care provider.   Document Released: 07/07/2001 Document Revised: 11/02/2014 Document Reviewed: 06/19/2013 Elsevier Interactive Patient Education 2016 [employer].   Tdap Vaccine (Tetanus, Diphtheria and Pertussis): What You Need to Know  1. Why get vaccinated? Tetanus, diphtheria and pertussis are very serious diseases. Tdap vaccine can protect [place] from these diseases. And, Tdap vaccine given to pregnant women can protect newborn babies against pertussis. TETANUS (Lockjaw) is rare in  the [place] today. It causes painful muscle tightening and stiffness, usually all over the body.  It can lead to tightening of muscles in the head and neck so you can't open your mouth, swallow, or sometimes even breathe. Tetanus kills about 1 out of 10 people who are infected even after receiving the best medical care. DIPHTHERIA is also rare in the [place] today. It can cause a thick coating to form in the back of the throat.  It can lead to breathing problems, heart failure, paralysis, and death. PERTUSSIS (Whooping Cough) causes severe coughing spells, which can cause difficulty breathing, vomiting and disturbed sleep.  It can also lead to weight loss, incontinence, and rib fractures. Up to 2 in 100 adolescents and 5 in 100 adults with pertussis are hospitalized or have complications, which could include pneumonia or death. These diseases are caused by bacteria. Diphtheria and pertussis are spread from person to person through secretions from coughing or sneezing. Tetanus enters the body through cuts, scratches, or wounds. Before vaccines, as many as 200,000 cases of diphtheria, 200,000 cases of pertussis, and hundreds of cases of tetanus, were reported in the [place] each year. Since vaccination began, reports of cases for tetanus and diphtheria have dropped by about 99% and for pertussis by about 80%. 2. Tdap vaccine Tdap vaccine can protect adolescents and adults from tetanus, diphtheria, and pertussis. One dose of Tdap is routinely given at age 13 or 33. People who did not get Tdap at that age should get it as soon as possible. Tdap is especially important for healthcare professionals and anyone having close contact with a baby younger than 12 months. Pregnant women should get a dose of Tdap during every pregnancy, to protect the newborn from pertussis. Infants are most at risk for severe, life-threatening complications from pertussis. Another vaccine, called Td, protects  against tetanus and diphtheria, but not pertussis. A Td booster should be given every 10 years. Tdap may be given as one of these boosters if you have never gotten Tdap before. Tdap may also be given after a severe cut or burn to prevent tetanus infection. Your doctor or the person giving you the vaccine can give you more information. Tdap may safely be given at the same time as other vaccines. 3. Some people should not get this vaccine  A person who has ever had a life-threatening allergic reaction after a previous dose of any diphtheria, tetanus or pertussis containing vaccine, OR has a severe allergy to any part of this vaccine, should not get Tdap vaccine. Tell the person giving the vaccine about any severe allergies.  Anyone who had coma or long repeated seizures within 7 days after a childhood dose of DTP or DTaP, or a previous dose of Tdap, should not get Tdap, unless a cause other than the vaccine was found. They can still get Td.  Talk to your doctor if you:  have seizures or another nervous system problem,  had severe pain or swelling after any vaccine containing diphtheria, tetanus or pertussis,  ever had a condition called Guillain-Barr Syndrome (GBS),  aren't feeling well on the day the shot is scheduled. 4. Risks With any medicine, including vaccines, there is a chance of side effects. These are usually mild and go away on their own. Serious reactions are also possible but are rare. Most people who get Tdap vaccine do not have any problems with it. Mild problems following Tdap (Did not interfere with activities)  Pain where the shot was given (about 3 in 4 adolescents or 2 in 3 adults)  Redness or swelling where the shot was given (about 1 person in 5)  Mild fever of at least 100.37F (up to about 1 in 25 adolescents or 1 in 100 adults)  Headache (about 3 or 4 people in 10)  Tiredness (about 1 person in 3 or 4)  Nausea, vomiting, diarrhea, stomach ache (up to 1 in 4  adolescents or 1 in 10 adults)  Chills, sore joints (about 1 person in 10)  Body aches (about 1 person in 3 or 4)  Rash, swollen glands (uncommon) Moderate problems following Tdap (Interfered with activities, but did not require medical attention)  Pain where the shot was given (up to 1 in 5 or 6)  Redness or swelling where the shot was given (up to about 1 in 16 adolescents or 1 in 12 adults)  Fever over 102F (about 1 in 100 adolescents or 1 in 250 adults)  Headache (about 1 in 7 adolescents or 1 in 10 adults)  Nausea, vomiting, diarrhea, stomach ache (up to 1 or 3 people in 100)  Swelling of the entire arm where the shot was given (up to about 1 in 500). Severe problems following Tdap (Unable to perform usual activities; required medical attention)  Swelling, severe pain, bleeding and redness in the arm where the shot was given (rare). Problems that could happen after any vaccine:  People sometimes faint after a medical procedure, including vaccination. Sitting or lying down for about 15 minutes can help prevent fainting, and injuries caused by a fall. Tell your doctor if you feel dizzy, or have vision changes or ringing in the ears.  Some people get severe pain in the shoulder and have difficulty moving the arm where a shot was given. This happens very rarely.  Any medication can cause a severe allergic reaction. Such reactions from a vaccine are very rare, estimated at fewer than 1 in a million doses, and would happen within a few minutes to a few hours after the vaccination. As with any medicine, there is a very remote chance of a vaccine causing a serious injury or death. The safety of vaccines is always being monitored. For more information, visit: http://www.aguilar.org/ 5. What if there is a serious problem? What should I look for?  Look for anything that concerns you, such as signs of a severe allergic reaction, very high fever, or unusual behavior.  Signs of a  severe allergic reaction can include hives, swelling of the face and throat, difficulty breathing, a fast heartbeat, dizziness, and weakness. These would usually start a few minutes to a few hours after the vaccination. What should I do?  If you think it is a severe allergic reaction or other emergency that can't wait, call 9-1-1 or get the person to the nearest hospital. Otherwise, call your doctor.  Afterward, the reaction should be reported to  the Vaccine Adverse Event Reporting System (VAERS). Your doctor might file this report, or you can do it yourself through the VAERS web site at www.vaers.SamedayNews.es, or by calling (814)282-6739. VAERS does not give medical advice.  6. The National Vaccine Injury Compensation Program The [employer] Vaccine Injury Compensation Program (VICP) is a federal program that was created to compensate people who may have been injured by certain vaccines. Persons who believe they may have been injured by a vaccine can learn about the program and about filing a claim by calling (657)093-9166 or visiting the [place] website at GoldCloset.com.ee. There is a time limit to file a claim for compensation. 7. How can I learn more?  Ask your doctor. He or she can give you the vaccine package insert or suggest other sources of information.  Call your local or state health department.  Contact the Centers for Disease Control and Prevention (CDC):  Call (708)746-5675 (1-800-CDC-INFO) or  Visit CDC's website at http://hunter.com/ CDC Tdap Vaccine VIS (12/19/13)   This information is not intended to replace advice given to you by your health care provider. Make sure you discuss any questions you have with your health care provider.   Document Released: 04/12/2012 Document Revised: 11/02/2014 Document Reviewed: 01/24/2014 Elsevier Interactive Patient Education [employer].

## 2016-05-13 LAB — HEPATITIS C ANTIBODY: HCV AB: NEGATIVE

## 2016-05-13 LAB — HIV ANTIBODY (ROUTINE TESTING W REFLEX): HIV: NONREACTIVE

## 2016-05-27 ENCOUNTER — Inpatient Hospital Stay
Admission: EM | Admit: 2016-05-27 | Discharge: 2016-05-28 | DRG: 247 | Disposition: A | Discharge status: Home / Self Care | Payer: 59 | Attending: Internal Medicine | Admitting: Internal Medicine

## 2016-05-27 ENCOUNTER — Encounter
Admission: EM | Disposition: A | Discharge status: Home / Self Care | Payer: Self-pay | Source: Home / Self Care | Attending: Internal Medicine

## 2016-05-27 ENCOUNTER — Inpatient Hospital Stay: Admit: 2016-05-27 | Payer: 59

## 2016-05-27 ENCOUNTER — Emergency Department: Payer: 59

## 2016-05-27 ENCOUNTER — Encounter: Payer: Self-pay | Admitting: Certified Registered Nurse Anesthetist

## 2016-05-27 ENCOUNTER — Inpatient Hospital Stay
Admit: 2016-05-27 | Discharge: 2016-05-27 | Disposition: A | Discharge status: Home / Self Care | Payer: 59 | Attending: Internal Medicine | Admitting: Internal Medicine

## 2016-05-27 ENCOUNTER — Encounter: Payer: Self-pay | Admitting: Emergency Medicine

## 2016-05-27 DIAGNOSIS — E785 Hyperlipidemia, unspecified: Secondary | ICD-10-CM | POA: Diagnosis present

## 2016-05-27 DIAGNOSIS — R079 Chest pain, unspecified: Secondary | ICD-10-CM | POA: Diagnosis not present

## 2016-05-27 DIAGNOSIS — R002 Palpitations: Secondary | ICD-10-CM

## 2016-05-27 DIAGNOSIS — F329 Major depressive disorder, single episode, unspecified: Secondary | ICD-10-CM | POA: Diagnosis present

## 2016-05-27 DIAGNOSIS — I249 Acute ischemic heart disease, unspecified: Secondary | ICD-10-CM

## 2016-05-27 DIAGNOSIS — E876 Hypokalemia: Secondary | ICD-10-CM | POA: Diagnosis present

## 2016-05-27 DIAGNOSIS — I214 Non-ST elevation (NSTEMI) myocardial infarction: Principal | ICD-10-CM | POA: Diagnosis present

## 2016-05-27 DIAGNOSIS — Z79899 Other long term (current) drug therapy: Secondary | ICD-10-CM | POA: Diagnosis not present

## 2016-05-27 DIAGNOSIS — K219 Gastro-esophageal reflux disease without esophagitis: Secondary | ICD-10-CM | POA: Diagnosis present

## 2016-05-27 DIAGNOSIS — I10 Essential (primary) hypertension: Secondary | ICD-10-CM | POA: Diagnosis present

## 2016-05-27 HISTORY — PX: CARDIAC CATHETERIZATION: SHX172

## 2016-05-27 HISTORY — DX: Essential (primary) hypertension: I10

## 2016-05-27 HISTORY — DX: Hyperlipidemia, unspecified: E78.5

## 2016-05-27 LAB — CBC
HCT: 32.8 % — ABNORMAL LOW (ref 35.0–47.0)
HEMOGLOBIN: 11.1 g/dL — AB (ref 12.0–16.0)
MCH: 27.3 pg (ref 26.0–34.0)
MCHC: 33.8 g/dL (ref 32.0–36.0)
MCV: 80.8 fL (ref 80.0–100.0)
PLATELETS: 190 10*3/uL (ref 150–440)
RBC: 4.06 MIL/uL (ref 3.80–5.20)
RDW: 14.6 % — ABNORMAL HIGH (ref 11.5–14.5)
WBC: 8.7 10*3/uL (ref 3.6–11.0)

## 2016-05-27 LAB — LIPID PANEL
Cholesterol: 241 mg/dL — ABNORMAL HIGH (ref 0–200)
HDL: 41 mg/dL (ref >40)
LDL Cholesterol: 174 mg/dL — ABNORMAL HIGH (ref 0–99)
Total CHOL/HDL Ratio: 5.9 RATIO
Triglycerides: 132 mg/dL (ref <150)
VLDL: 26 mg/dL (ref 0–40)

## 2016-05-27 LAB — TSH
TSH: 0.842 u[IU]/mL (ref 0.350–4.500)
TSH: 0.488 u[IU]/mL (ref 0.350–4.500)

## 2016-05-27 LAB — BASIC METABOLIC PANEL
Anion gap: 9 (ref 5–15)
BUN: 14 mg/dL (ref 6–20)
CHLORIDE: 106 mmol/L (ref 101–111)
CO2: 26 mmol/L (ref 22–32)
CREATININE: 0.95 mg/dL (ref 0.44–1.00)
Calcium: 8.8 mg/dL — ABNORMAL LOW (ref 8.9–10.3)
Glucose, Bld: 153 mg/dL — ABNORMAL HIGH (ref 65–99)
POTASSIUM: 3.1 mmol/L — AB (ref 3.5–5.1)
SODIUM: 141 mmol/L (ref 135–145)

## 2016-05-27 LAB — TROPONIN I
TROPONIN I: 0.3 ng/mL — AB (ref <0.03)
Troponin I: 3.39 ng/mL (ref <0.03)
Troponin I: 4.1 ng/mL (ref <0.03)
Troponin I: 3.44 ng/mL (ref <0.03)

## 2016-05-27 LAB — HEMOGLOBIN A1C: Hgb A1c MFr Bld: 6.1 % — ABNORMAL HIGH (ref 4.0–6.0)

## 2016-05-27 LAB — MAGNESIUM: MAGNESIUM: 1.8 mg/dL (ref 1.7–2.4)

## 2016-05-27 SURGERY — LEFT HEART CATH AND CORONARY ANGIOGRAPHY
Anesthesia: Moderate Sedation | Laterality: Right

## 2016-05-27 MED ORDER — HYDROCHLOROTHIAZIDE 25 MG PO TABS: 25.0000 mg | ORAL_TABLET | Freq: Every day | ORAL | Status: DC

## 2016-05-27 MED ORDER — SODIUM CHLORIDE 0.9 % IV SOLN: 250.0000 mL | INTRAVENOUS | Status: DC | PRN

## 2016-05-27 MED ORDER — SODIUM CHLORIDE 0.9% FLUSH
3.0000 mL | Freq: Two times a day (BID) | INTRAVENOUS | Status: DC
  Administered 2016-05-27 – 2016-05-28 (×2): 3 mL via INTRAVENOUS

## 2016-05-27 MED ORDER — AMITRIPTYLINE HCL 25 MG PO TABS
50.0000 mg | ORAL_TABLET | Freq: Every day | ORAL | Status: DC
  Administered 2016-05-27: 50 mg via ORAL
  Filled 2016-05-27: qty 2

## 2016-05-27 MED ORDER — ATORVASTATIN CALCIUM 20 MG PO TABS
80.0000 mg | ORAL_TABLET | Freq: Every day | ORAL | Status: DC
  Administered 2016-05-27: 80 mg via ORAL
  Filled 2016-05-27: qty 4

## 2016-05-27 MED ORDER — SODIUM CHLORIDE 0.9 % WEIGHT BASED INFUSION
3.0000 mL/kg/h | INTRAVENOUS | Status: AC
  Administered 2016-05-27: 3 mL/kg/h via INTRAVENOUS

## 2016-05-27 MED ORDER — SODIUM CHLORIDE 0.9% FLUSH: 3.0000 mL | INTRAVENOUS | Status: DC | PRN

## 2016-05-27 MED ORDER — NITROGLYCERIN 5 MG/ML IV SOLN
INTRAVENOUS | Status: AC
Start: 2016-05-27 — End: 2016-05-27
  Filled 2016-05-27: qty 10

## 2016-05-27 MED ORDER — OXYCODONE HCL 5 MG PO TABS: 5.0000 mg | ORAL_TABLET | ORAL | Status: DC | PRN

## 2016-05-27 MED ORDER — TICAGRELOR 90 MG PO TABS: 90.0000 mg | ORAL_TABLET | ORAL | Status: DC

## 2016-05-27 MED ORDER — IOPAMIDOL (ISOVUE-300) INJECTION 61%
INTRAVENOUS | Status: DC | PRN
  Administered 2016-05-27: 280 mL via INTRA_ARTERIAL

## 2016-05-27 MED ORDER — MIDAZOLAM HCL 2 MG/2ML IJ SOLN
INTRAMUSCULAR | Status: DC | PRN
  Administered 2016-05-27: 1 mg via INTRAVENOUS

## 2016-05-27 MED ORDER — ASPIRIN 81 MG PO CHEW
CHEWABLE_TABLET | ORAL | Status: AC
  Filled 2016-05-27: qty 4

## 2016-05-27 MED ORDER — FENTANYL CITRATE (PF) 100 MCG/2ML IJ SOLN
INTRAMUSCULAR | Status: DC | PRN
  Administered 2016-05-27 (×2): 25 ug via INTRAVENOUS

## 2016-05-27 MED ORDER — SODIUM CHLORIDE 0.9 % WEIGHT BASED INFUSION: 3.0000 mL/kg/h | INTRAVENOUS | Status: DC

## 2016-05-27 MED ORDER — ENOXAPARIN SODIUM 40 MG/0.4ML ~~LOC~~ SOLN
40.0000 mg | SUBCUTANEOUS | Status: DC
  Administered 2016-05-27: 40 mg via SUBCUTANEOUS
  Filled 2016-05-27: qty 0.4

## 2016-05-27 MED ORDER — ONDANSETRON HCL 4 MG/2ML IJ SOLN: 4.0000 mg | Freq: Four times a day (QID) | INTRAMUSCULAR | Status: DC | PRN

## 2016-05-27 MED ORDER — ACETAMINOPHEN 325 MG PO TABS
650.0000 mg | ORAL_TABLET | Freq: Four times a day (QID) | ORAL | Status: DC | PRN
  Administered 2016-05-27: 650 mg via ORAL
  Filled 2016-05-27: qty 2

## 2016-05-27 MED ORDER — TICAGRELOR 90 MG PO TABS
90.0000 mg | ORAL_TABLET | Freq: Two times a day (BID) | ORAL | Status: DC
  Administered 2016-05-27 – 2016-05-28 (×2): 90 mg via ORAL
  Filled 2016-05-27 (×2): qty 1

## 2016-05-27 MED ORDER — ACETAMINOPHEN 650 MG RE SUPP: 650.0000 mg | Freq: Four times a day (QID) | RECTAL | Status: DC | PRN

## 2016-05-27 MED ORDER — ENOXAPARIN SODIUM 80 MG/0.8ML ~~LOC~~ SOLN: 1.0000 mg/kg | Freq: Two times a day (BID) | SUBCUTANEOUS | Status: DC

## 2016-05-27 MED ORDER — SODIUM CHLORIDE 0.9 % IV BOLUS (SEPSIS)
1000.0000 mL | Freq: Once | INTRAVENOUS | Status: AC
  Administered 2016-05-27: 1000 mL via INTRAVENOUS

## 2016-05-27 MED ORDER — ASPIRIN 81 MG PO CHEW
324.0000 mg | CHEWABLE_TABLET | Freq: Once | ORAL | Status: AC
  Administered 2016-05-27: 324 mg via ORAL
  Filled 2016-05-27: qty 4

## 2016-05-27 MED ORDER — ASPIRIN 81 MG PO CHEW: 81.0000 mg | CHEWABLE_TABLET | Freq: Every day | ORAL | Status: DC

## 2016-05-27 MED ORDER — ATORVASTATIN CALCIUM 20 MG PO TABS: 40.0000 mg | ORAL_TABLET | Freq: Every day | ORAL | Status: DC

## 2016-05-27 MED ORDER — SIMVASTATIN 20 MG PO TABS: 20.0000 mg | ORAL_TABLET | Freq: Every day | ORAL | Status: DC

## 2016-05-27 MED ORDER — BIVALIRUDIN 250 MG IV SOLR
INTRAVENOUS | Status: AC
  Filled 2016-05-27: qty 250

## 2016-05-27 MED ORDER — METOPROLOL TARTRATE 25 MG PO TABS
25.0000 mg | ORAL_TABLET | Freq: Two times a day (BID) | ORAL | Status: DC
  Administered 2016-05-27 – 2016-05-28 (×3): 25 mg via ORAL
  Filled 2016-05-27 (×3): qty 1

## 2016-05-27 MED ORDER — MAGNESIUM SULFATE 2 GM/50ML IV SOLN
2.0000 g | Freq: Once | INTRAVENOUS | Status: AC
  Administered 2016-05-27: 2 g via INTRAVENOUS
  Filled 2016-05-27: qty 50

## 2016-05-27 MED ORDER — SODIUM CHLORIDE 0.9 % WEIGHT BASED INFUSION: 1.0000 mL/kg/h | INTRAVENOUS | Status: DC

## 2016-05-27 MED ORDER — HEPARIN (PORCINE) IN NACL 2-0.9 UNIT/ML-% IJ SOLN
INTRAMUSCULAR | Status: AC
  Filled 2016-05-27: qty 500

## 2016-05-27 MED ORDER — FENTANYL CITRATE (PF) 100 MCG/2ML IJ SOLN
INTRAMUSCULAR | Status: AC
  Filled 2016-05-27: qty 2

## 2016-05-27 MED ORDER — ACETAMINOPHEN 325 MG PO TABS: 650.0000 mg | ORAL_TABLET | ORAL | Status: DC | PRN

## 2016-05-27 MED ORDER — ONDANSETRON HCL 4 MG PO TABS: 4.0000 mg | ORAL_TABLET | Freq: Four times a day (QID) | ORAL | Status: DC | PRN

## 2016-05-27 MED ORDER — POTASSIUM CHLORIDE CRYS ER 20 MEQ PO TBCR
40.0000 meq | EXTENDED_RELEASE_TABLET | Freq: Once | ORAL | Status: AC
  Administered 2016-05-27: 40 meq via ORAL
  Filled 2016-05-27: qty 2

## 2016-05-27 MED ORDER — ASPIRIN 81 MG PO CHEW: 81.0000 mg | CHEWABLE_TABLET | ORAL | Status: DC

## 2016-05-27 MED ORDER — PANTOPRAZOLE SODIUM 40 MG PO TBEC
40.0000 mg | DELAYED_RELEASE_TABLET | Freq: Every day | ORAL | Status: DC
  Administered 2016-05-28: 40 mg via ORAL
  Filled 2016-05-27: qty 1

## 2016-05-27 MED ORDER — ENOXAPARIN SODIUM 30 MG/0.3ML ~~LOC~~ SOLN
30.0000 mg | SUBCUTANEOUS | Status: DC
Start: 2016-05-27 — End: 2016-05-27

## 2016-05-27 MED ORDER — SODIUM CHLORIDE 0.9% FLUSH
3.0000 mL | Freq: Two times a day (BID) | INTRAVENOUS | Status: DC
  Administered 2016-05-28: 3 mL via INTRAVENOUS

## 2016-05-27 MED ORDER — TICAGRELOR 90 MG PO TABS
180.0000 mg | ORAL_TABLET | ORAL | Status: AC
  Administered 2016-05-27: 180 mg via ORAL
  Filled 2016-05-27: qty 2

## 2016-05-27 MED ORDER — SODIUM CHLORIDE 0.9% FLUSH: 3.0000 mL | Freq: Two times a day (BID) | INTRAVENOUS | Status: DC

## 2016-05-27 MED ORDER — ASPIRIN EC 81 MG PO TBEC
81.0000 mg | DELAYED_RELEASE_TABLET | Freq: Every day | ORAL | Status: DC
  Administered 2016-05-27 – 2016-05-28 (×2): 81 mg via ORAL
  Filled 2016-05-27 (×2): qty 1

## 2016-05-27 MED ORDER — MIDAZOLAM HCL 2 MG/2ML IJ SOLN
INTRAMUSCULAR | Status: AC
  Filled 2016-05-27: qty 2

## 2016-05-27 MED ORDER — DOCUSATE SODIUM 100 MG PO CAPS
100.0000 mg | ORAL_CAPSULE | Freq: Two times a day (BID) | ORAL | Status: DC
  Administered 2016-05-27 – 2016-05-28 (×2): 100 mg via ORAL
  Filled 2016-05-27 (×2): qty 1

## 2016-05-27 MED ORDER — BIVALIRUDIN BOLUS VIA INFUSION - CUPID
INTRAVENOUS | Status: DC | PRN
  Administered 2016-05-27 (×2): 51.15 mg via INTRAVENOUS

## 2016-05-27 SURGICAL SUPPLY — 18 items
BALLN TREK RX 3.0X15 (BALLOONS) ×4
BALLN ~~LOC~~ TREK RX 4.0X15 (BALLOONS) ×4
BALLOON TREK RX 3.0X15 (BALLOONS) ×2 IMPLANT
BALLOON ~~LOC~~ TREK RX 4.0X15 (BALLOONS) ×2 IMPLANT
CATH INFINITI 5FR ANG PIGTAIL (CATHETERS) ×4 IMPLANT
CATH INFINITI 5FR JL4 (CATHETERS) ×4 IMPLANT
CATH INFINITI JR4 5F (CATHETERS) ×4 IMPLANT
CATH VISTA GUIDE 6FR XB3.5 (CATHETERS) ×4 IMPLANT
DEVICE CLOSURE MYNXGRIP 6/7F (Vascular Products) ×4 IMPLANT
DEVICE INFLAT 30 PLUS (MISCELLANEOUS) ×4 IMPLANT
KIT MANI 3VAL PERCEP (MISCELLANEOUS) ×4 IMPLANT
NEEDLE PERC 18GX7CM (NEEDLE) ×4 IMPLANT
PACK CARDIAC CATH (CUSTOM PROCEDURE TRAY) ×4 IMPLANT
SHEATH AVANTI 6FR X 11CM (SHEATH) ×4 IMPLANT
SHEATH PINNACLE 5F 10CM (SHEATH) ×4 IMPLANT
STENT XIENCE ALPINE RX 4.0X18 (Permanent Stent) ×4 IMPLANT
WIRE EMERALD 3MM-J .035X150CM (WIRE) ×4 IMPLANT
WIRE G HI TQ BMW 190 (WIRE) ×4 IMPLANT

## 2016-05-27 NOTE — H&P (Signed)
Chelsey Wilson is an 50 y.o. female.   Chief Complaint: Chest pain HPI: The patient with past medical history of hyperlipidemia and essential hypertension presents to the emergency department complaining of chest pain. 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 The pain began substernally and did not radiate. It was associated with shortness of breath and a generalized feeling of weakness. She states that the pain resolved on its own after at least 30 minutes. She recalls feeling no different immediately following aspirin. In the emergency department she was found to have ST segment depressions in her inferior leads as well as anterior septal and lateral leads. Troponin was found to be mildly elevated which prompted the emergency department to all the hospitalist service for admission.  Past Medical History:  Diagnosis Date  . Essential hypertension   . HLD (hyperlipidemia)      History reviewed. No pertinent surgical history. None  Family History  Problem Relation Age of Onset  . Other Neg Hx    Social History:  reports that she has never smoked. She has never used smokeless tobacco. She reports that she does not drink alcohol or use drugs.  Allergies: No Known Allergies  Medications Prior to Admission  Medication Sig Dispense Refill  . amitriptyline (ELAVIL) 50 MG tablet Take 1 tablet (50 mg total) by mouth at bedtime. 90 tablet 3  . hydrochlorothiazide (HYDRODIURIL) 25 MG tablet Take 1 tablet (25 mg total) by mouth daily. 90 tablet 3  . simvastatin (ZOCOR) 20 MG tablet Take 1 tablet (20 mg total) by mouth at bedtime. 90 tablet 3    Results for orders placed or performed during the hospital encounter of 05/27/16 (from the past 48 hour(s))  CBC     Status: Abnormal   Collection Time: 05/27/16  1:55 AM  Result Value Ref Range   WBC 8.7 3.6 - 11.0 K/uL   RBC 4.06 3.80 - 5.20 MIL/uL   Hemoglobin 11.1 (L)  12.0 - 16.0 g/dL   HCT 32.8 (L) 35.0 - 47.0 %   MCV 80.8 80.0 - 100.0 fL   MCH 27.3 26.0 - 34.0 pg   MCHC 33.8 32.0 - 36.0 g/dL   RDW 14.6 (H) 11.5 - 14.5 %   Platelets 190 150 - 440 K/uL  Basic metabolic panel     Status: Abnormal   Collection Time: 05/27/16  1:55 AM  Result Value Ref Range   Sodium 141 135 - 145 mmol/L   Potassium 3.1 (L) 3.5 - 5.1 mmol/L   Chloride 106 101 - 111 mmol/L   CO2 26 22 - 32 mmol/L   Glucose, Bld 153 (H) 65 - 99 mg/dL   BUN 14 6 - 20 mg/dL   Creatinine, Ser 0.95 0.44 - 1.00 mg/dL   Calcium 8.8 (L) 8.9 - 10.3 mg/dL   GFR calc non Af Amer >60 >60 mL/min   GFR calc Af Amer >60 >60 mL/min    Comment: (NOTE) The eGFR has been calculated using the CKD EPI equation. This calculation has not been validated in all clinical situations. eGFR's persistently <60 mL/min signify possible Chronic Kidney Disease.    Anion gap 9 5 - 15  Troponin I     Status: Abnormal   Collection Time: 05/27/16  1:55 AM  Result Value Ref Range   Troponin I 0.30 (HH) <0.03 ng/mL    Comment: CRITICAL RESULT CALLED TO, READ BACK BY  AND VERIFIED WITH ANDREA BRYANT ON 05/27/16 AT 0311 BY TLB   Magnesium     Status: None   Collection Time: 05/27/16  1:55 AM  Result Value Ref Range   Magnesium 1.8 1.7 - 2.4 mg/dL  TSH     Status: None   Collection Time: 05/27/16  1:55 AM  Result Value Ref Range   TSH 0.842 0.350 - 4.500 uIU/mL   Dg Chest 2 View  Result Date: 05/27/2016 CLINICAL DATA:  Palpitations and chest pain. Dizziness and weakness. Symptom onset tonight. EXAM: CHEST  2 VIEW COMPARISON:  08/02/2013 FINDINGS: Mild cardiomegaly and chronic interstitial opacities are stable from prior exam. Mediastinal contours are stable. No focal airspace opacity, pleural effusion or pulmonary edema. Tiny nodular opacity in the right lung base is unchanged and may be a calcified granuloma. No pneumothorax. Remote right rib fracture. IMPRESSION: Stable mild cardiomegaly and chronic interstitial  change. No acute abnormality is seen. Electronically Signed   By: Jeb Levering M.D.   On: 05/27/2016 02:20    Review of Systems  Constitutional: Positive for diaphoresis. Negative for chills and fever.  HENT: Negative for sore throat and tinnitus.   Eyes: Negative for blurred vision and redness.  Respiratory: Positive for shortness of breath. Negative for cough.   Cardiovascular: Positive for chest pain. Negative for palpitations, orthopnea and PND.  Gastrointestinal: Negative for abdominal pain, diarrhea, nausea and vomiting.  Genitourinary: Negative for dysuria, frequency and urgency.  Musculoskeletal: Negative for joint pain and myalgias.  Skin: Negative for rash.       No lesions  Neurological: Positive for weakness. Negative for speech change and focal weakness.  Endo/Heme/Allergies: Does not bruise/bleed easily.       No temperature intolerance  Psychiatric/Behavioral: Negative for depression and suicidal ideas.    Blood pressure 139/78, pulse 81, temperature 97.9 F (36.6 C), temperature source Oral, resp. rate 18, height 5' 2"  (1.575 m), weight 68.2 kg (150 lb 4.8 oz), SpO2 100 %. Physical Exam  Vitals reviewed. Constitutional: She is oriented to person, place, and time. She appears well-developed and well-nourished. No distress.  HENT:  Head: Normocephalic and atraumatic.  Mouth/Throat: Oropharynx is clear and moist.  Eyes: Conjunctivae and EOM are normal. Pupils are equal, round, and reactive to light. No scleral icterus.  Neck: Normal range of motion. Neck supple. No JVD present. No tracheal deviation present. No thyromegaly present.  Cardiovascular: Normal rate, regular rhythm and normal heart sounds.  Exam reveals no gallop and no friction rub.   No murmur heard. Respiratory: Effort normal and breath sounds normal.  GI: Soft. Bowel sounds are normal. She exhibits no distension. There is no tenderness.  Genitourinary:  Genitourinary Comments: Deferred   Musculoskeletal: Normal range of motion. She exhibits no edema.  Lymphadenopathy:    She has no cervical adenopathy.  Neurological: She is alert and oriented to person, place, and time. No cranial nerve deficit. She exhibits normal muscle tone.  Skin: Skin is warm and dry. No rash noted. No erythema.  Psychiatric: She has a normal mood and affect. Her behavior is normal. Judgment and thought content normal.     Assessment/Plan This is a 50 year old female admitted for NSTEMI. 1. NSTEMI: Initial troponin is elevated. Continue to monitor telemetry and follow cardiac biomarkers. I place the patient on therapeutic Lovenox. Cardiology has been consulted. 2. Essential hypertension: Controlled. Continue hydrochlorothiazide 3. Hyperlipidemia: Continue statin therapy. Check lipid panel 4. DVT prophylaxis: Full dose Zantac regulation as above 5. GI prophylaxis: Pantoprazole  due to feelings of reflux The patient is a full code. Time spent on admission orders and patient care approximately 45 minutes  Harrie Foreman, MD 05/27/2016, 8:08 AM

## 2016-05-27 NOTE — ED Notes (Signed)
Pt ambulated to the restroom with a steady gait.

## 2016-05-27 NOTE — Progress Notes (Signed)
[place] [facility] [place] NAME: Chelsey Wilson    MR#:  [id number]  DATE OF BIRTH:  09/12/1957  SUBJECTIVE:  CHIEF COMPLAINT:   Chief Complaint  Patient presents with  . Dizziness   - Admitted with chest pain, diaphoresis and nausea, vomiting - troponins elevated  REVIEW OF SYSTEMS:  Review of Systems  Constitutional: Negative for chills, fever and malaise/fatigue.  HENT: Negative for ear discharge, ear pain and nosebleeds.   Respiratory: Negative for cough, shortness of breath and wheezing.   Cardiovascular: Positive for chest pain. Negative for palpitations and leg swelling.  Gastrointestinal: Negative for abdominal pain, constipation, diarrhea, nausea and vomiting.  Genitourinary: Negative for dysuria and urgency.  Musculoskeletal: Negative for myalgias.  Neurological: Negative for dizziness, sensory change, speech change, focal weakness, seizures and headaches.    DRUG ALLERGIES:  No Known Allergies  VITALS:  Blood pressure 139/78, pulse 81, temperature 97.9 F (36.6 C), temperature source Oral, resp. rate 18, height 5\' 2"  (1.575 m), weight 68.2 kg (150 lb 4.8 oz), SpO2 100 %.  PHYSICAL EXAMINATION:  Physical Exam  GENERAL:  49 y.o.-year-old patient lying in the bed with no acute distress.  EYES: Pupils equal, round, reactive to light and accommodation. No scleral icterus. Extraocular muscles intact.  HEENT: Head atraumatic, normocephalic. Oropharynx and nasopharynx clear.  NECK:  Supple, no jugular venous distention. No thyroid enlargement, no tenderness.  LUNGS: Normal breath sounds bilaterally, no wheezing, rales,rhonchi or crepitation. No use of accessory muscles of respiration.  CARDIOVASCULAR: S1, S2 normal. No murmurs, rubs, or gallops.  ABDOMEN: Soft, nontender, nondistended. Bowel sounds present. No organomegaly or mass.  EXTREMITIES: No pedal edema, cyanosis, or clubbing.  NEUROLOGIC: Cranial nerves II through XII are  intact. Muscle strength 5/5 in all extremities. Sensation intact. Gait not checked.  PSYCHIATRIC: The patient is alert and oriented x 3.  SKIN: No obvious rash, lesion, or ulcer.    LABORATORY PANEL:   CBC  Recent Labs Lab 05/27/16 0155  WBC 8.7  HGB 11.1*  HCT 32.8*  PLT 190   ------------------------------------------------------------------------------------------------------------------  Chemistries   Recent Labs Lab 05/27/16 0155  NA 141  K 3.1*  CL 106  CO2 26  GLUCOSE 153*  BUN 14  CREATININE 0.95  CALCIUM 8.8*  MG 1.8   ------------------------------------------------------------------------------------------------------------------  Cardiac Enzymes  Recent Labs Lab 05/27/16 0155  TROPONINI 0.30*   ------------------------------------------------------------------------------------------------------------------  RADIOLOGY:  Dg Chest 2 View  Result Date: 05/27/2016 CLINICAL DATA:  Palpitations and chest pain. Dizziness and weakness. Symptom onset tonight. EXAM: CHEST  2 VIEW COMPARISON:  08/02/2013 FINDINGS: Mild cardiomegaly and chronic interstitial opacities are stable from prior exam. Mediastinal contours are stable. No focal airspace opacity, pleural effusion or pulmonary edema. Tiny nodular opacity in the right lung base is unchanged and may be a calcified granuloma. No pneumothorax. Remote right rib fracture. IMPRESSION: Stable mild cardiomegaly and chronic interstitial change. No acute abnormality is seen. Electronically Signed   By: Jeb Levering M.D.   On: 05/27/2016 02:20    EKG:   Orders placed or performed during the hospital encounter of 05/27/16  . EKG 12-Lead  . EKG 12-Lead  . EKG 12-Lead  . EKG 12-Lead    ASSESSMENT AND PLAN:   50 year old very pleasant female with past medical history significant for hyperlipidemia and hypertension admitted for chest pain.  #1 NSTEMI- typical chest pain and EKG changes noted - started on  lovenox therapeutic dose - hold this morning as  troponin further elevated and will need cardiac cath - Appreciate cardiology consult - started on asa, check lipid panel - added metoprolol and changed statin to high intensity statin - prn nitro  #2 HTN- discontinue HCTZ, started metoprolol  #3 Hypokalemia- being replaced, HCTZ discontinued  #4 GERD- protonix  #5 Depression- amitriptyline  #6 DVT Prophylaxis- received lovenox- currently held for cardiac cath today     All the records are reviewed and case discussed with Care Management/Social Workerr. Management plans discussed with the patient, family and they are in agreement.  CODE STATUS: Full Code  TOTAL TIME TAKING CARE OF THIS PATIENT: 37 minutes.   POSSIBLE D/C IN 1-2 DAYS, DEPENDING ON CLINICAL CONDITION.   Gladstone Lighter M.D on 05/27/2016 at 9:03 AM  Between 7am to 6pm - Pager - [phone]  After 6pm go to [email or web address] - password [facility]  Office  [phone]  CC: Primary care physician; Maren Reamer, MD

## 2016-05-27 NOTE — ED Provider Notes (Signed)
[facility] Emergency Department Provider Note   ____________________________________________   First MD Initiated Contact with Patient 05/27/16 0122     (approximate)  I have reviewed the triage vital signs and the nursing notes.   HISTORY  Chief Complaint Dizziness    HPI Chelsey Wilson is a 50 y.o. female who comes into the hospital today with some palpitations. The patient reports that around 10 PM she was getting ready to go to work and her heart started beating fast. She reports that she then started to get sweaty, weak and dizzy. She reports that she developed some pains in her chest and worsening weakness. She felt weak down her left arm. She reports that she's never had this before. She called from work and decided to come in and get checked out. The patient reports that she had some pain in her mid chest as well as some pain in her back. She felt as though she had swallowed something and it was stuck. The patient did vomit once when she arrived and she reports that the symptoms resolved. The patient also had some mild fast breathing and shortness of breath and a mild headache. The patient reports that she does not drink caffeine. She decided to come in and get checked out to determine what may have been going on and causing her symptoms.  Past medical history HTN  Patient Active Problem List   Diagnosis Date Noted  . Chest pain 05/27/2016  . 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 Fibromyalgia 11/11/2015  . Healthcare maintenance 11/11/2015  . Dyslipidemia 03/18/2015  . Chronic pain 09/17/2014  . Essential hypertension, benign 09/17/2014  . Sepsis ([place]) 08/03/2013  . Bacteremia GNR 08/03/2013  . Pyelonephritis 08/02/2013  . Hypokalemia 08/02/2013    No past surgical history  Prior to Admission medications   Medication Sig Start Date End Date Taking? Authorizing Provider  amitriptyline (ELAVIL) 50 MG tablet Take 1 tablet (50 mg total) by mouth at  bedtime. 05/12/16  Yes Maren Reamer, MD  hydrochlorothiazide (HYDRODIURIL) 25 MG tablet Take 1 tablet (25 mg total) by mouth daily. 05/12/16  Yes Maren Reamer, MD  simvastatin (ZOCOR) 20 MG tablet Take 1 tablet (20 mg total) by mouth at bedtime. 05/12/16  Yes Maren Reamer, MD    Allergies Review of patient's allergies indicates no known allergies.  Family History  Problem Relation Age of Onset  . Other Neg Hx     Social History Social History  Substance Use Topics  . Smoking status: Never Smoker  . Smokeless tobacco: Never Used  . Alcohol use No    Review of Systems Constitutional: sweats Eyes: No visual changes. ENT: No sore throat. Cardiovascular: palpitations and chest pain. Respiratory:  shortness of breath. Gastrointestinal: vomiting with No abdominal pain. No diarrhea.  No constipation. Genitourinary: Negative for dysuria. Musculoskeletal: Negative for back pain. Skin: Negative for rash. Neurological: headache and weakness  10-point ROS otherwise negative.  ____________________________________________   PHYSICAL EXAM:  VITAL SIGNS: ED Triage Vitals  Enc Vitals Group     BP 05/27/16 0106 108/66     Pulse Rate 05/27/16 0106 88     Resp --      Temp 05/27/16 0106 97.6 F (36.4 C)     Temp Source 05/27/16 0106 Oral     SpO2 05/27/16 0106 96 %     Weight 05/27/16 0109 150 lb (68 kg)     Height 05/27/16 0109 5\' 2"  (1.575 m)  Head Circumference --      Peak Flow --      Pain Score 05/27/16 0113 0     Pain Loc --      Pain Edu? --      Excl. in [place]? --     Constitutional: Alert and oriented. Well appearing and in no acute distress. Eyes: Conjunctivae are normal. PERRL. EOMI. Head: Atraumatic. Nose: No congestion/rhinnorhea. Mouth/Throat: Mucous membranes are moist.  Oropharynx non-erythematous. Cardiovascular: Normal rate, regular rhythm. Grossly normal heart sounds.  Good peripheral circulation. Respiratory: Normal respiratory effort.  No  retractions. Lungs CTAB. Gastrointestinal: Soft and nontender. No distention. Positive bowel sounds Musculoskeletal: No lower extremity tenderness nor edema.   Neurologic:  Normal speech and language. Cranial nerves II through XII are grossly intact with no focal motor or neuro deficits Skin:  Skin is warm, dry and intact.  Psychiatric: Mood and affect are normal.   ____________________________________________   LABS (all labs ordered are listed, but only abnormal results are displayed)  Labs Reviewed  CBC - Abnormal; Notable for the following:       Result Value   Hemoglobin 11.1 (*)    HCT 32.8 (*)    RDW 14.6 (*)    All other components within normal limits  BASIC METABOLIC PANEL - Abnormal; Notable for the following:    Potassium 3.1 (*)    Glucose, Bld 153 (*)    Calcium 8.8 (*)    All other components within normal limits  TROPONIN I - Abnormal; Notable for the following:    Troponin I 0.30 (*)    All other components within normal limits  MAGNESIUM  TSH   ____________________________________________  EKG  ED ECG REPORT I, Loney Hering, the attending physician, personally viewed and interpreted this ECG.   Date: 05/27/2016  EKG Time: 100  Rate: 85  Rhythm: normal sinus rhythm  Axis: normal  Intervals:none  ST&T Change: ST segment depression in leads II, III and aVF as well as in leads V3, V4, V5, V6, The patient has had some lateral depressions in the past seen on an EKG from 2014.  ____________________________________________  RADIOLOGY  CXR ____________________________________________   PROCEDURES  Procedure(s) performed: None  Procedures  Critical Care performed: No  ____________________________________________   INITIAL IMPRESSION / ASSESSMENT AND PLAN / ED COURSE  Pertinent labs & imaging results that were available during my care of the patient were reviewed by me and considered in my medical decision making (see chart for  details).  This is a 50 year old female who comes into the hospital today with some dizziness as well as some palpitations the patient reports that her symptoms have resolved but she is unsure what may have caused her symptoms. We'll check some blood work as well as a chest x-ray. The patient receive a liter of normal saline and she will be reassessed.  Clinical Course   CXR: Stable mild cardiomegaly and chronic interstitial change. No acute abnormality is seen.  The patient's troponin came back elevated at 0.3. She also has a magnesium of 1.8. Although the patient's symptoms are resolved she did have some ST depressions. I will admit the patient to the hospitalist service for further evaluation. The patient did receive a dose of aspirin while she was in the hospital. I discussed this with the patient as well as with the admitting doctor. The patient has no further complaints or concerns at this time. I will give the patient a dose of magnesium sulfate as  well.  ____________________________________________   FINAL CLINICAL IMPRESSION(S) / ED DIAGNOSES  Final diagnoses:  Palpitations  Acute coronary syndrome (HCC)      NEW MEDICATIONS STARTED DURING THIS VISIT:  New Prescriptions   No medications on file     Note:  This document was prepared using Dragon voice recognition software and may include unintentional dictation errors.    Loney Hering, MD 05/27/16 [phone]

## 2016-05-27 NOTE — Progress Notes (Signed)
Patient had left heart catheterization without complication. Cardiac catheterization revealed high-grade lesion about 90% and mid left circumflex for which PCI and stenting with drug-eluting stent was done.

## 2016-05-27 NOTE — Consult Note (Signed)
Chelsey Wilson is a 50 y.o. female  [id number]  Primary Cardiologist: Neoma Laming Reason for Consultation: Chest pain with elevated troponin  HPI: Chelsey Wilson began to experience chest pain last evening while preparing for bed. She points to the area of the upper mid chest and states that it felt like she was trying to swallow something that wouldn't go down. The discomfort was mild and short-lived and was not associated with any shortness of breath, nausea, or dizziness. She has no previous cardiac history and is a nonsmoker. She does have a history of hypertension and hyperlipidemia. She does not have any family history of cardiovascular issues that she knows of. There has been no recurrence of chest discomfort since admission.   Review of Systems: Negative for shortness of breath, palpitations, dizziness, nausea, swelling of the lower extremities   Past Medical History:  Diagnosis Date  . Essential hypertension   . HLD (hyperlipidemia)     Medications Prior to Admission  Medication Sig Dispense Refill  . amitriptyline (ELAVIL) 50 MG tablet Take 1 tablet (50 mg total) by mouth at bedtime. 90 tablet 3  . hydrochlorothiazide (HYDRODIURIL) 25 MG tablet Take 1 tablet (25 mg total) by mouth daily. 90 tablet 3  . simvastatin (ZOCOR) 20 MG tablet Take 1 tablet (20 mg total) by mouth at bedtime. 90 tablet 3     . amitriptyline  50 mg Oral QHS  . aspirin EC  81 mg Oral Daily  . docusate sodium  100 mg Oral BID  . enoxaparin (LOVENOX) injection  30 mg Subcutaneous STAT  . enoxaparin (LOVENOX) injection  1 mg/kg Subcutaneous Q12H  . hydrochlorothiazide  25 mg Oral Daily  . pantoprazole  40 mg Oral Daily  . simvastatin  20 mg Oral QHS  . sodium chloride flush  3 mL Intravenous Q12H    Infusions:    No Known Allergies  Social History   Social History  . Marital status: Single    Spouse name: N/A  . Number of children: N/A  . Years of education: N/A   Occupational History  . Not  on file.   Social History Main Topics  . Smoking status: Never Smoker  . Smokeless tobacco: Never Used  . Alcohol use No  . Drug use: No  . Sexual activity: No   Other Topics Concern  . Not on file   Social History Narrative  . No narrative on file    Family History  Problem Relation Age of Onset  . Other Neg Hx     PHYSICAL EXAM: Vitals:   05/27/16 0430 05/27/16 0529  BP: 127/89 139/78  Pulse: 91 81  Resp: 14 18  Temp:  97.9 F (36.6 C)     Intake/Output Summary (Last 24 hours) at 05/27/16 0839 Last data filed at 05/27/16 0700  Gross per 24 hour  Intake                0 ml  Output                0 ml  Net                0 ml    General:  Well appearing. No respiratory difficulty HEENT: normal Neck: supple. no JVD. Carotids 2+ bilat; no bruits. No lymphadenopathy or thryomegaly appreciated. Cor: PMI nondisplaced. Regular rate & rhythm. No rubs, gallops or murmurs. Lungs: clear Abdomen: soft, nontender, nondistended. No hepatosplenomegaly. No bruits or masses. Good bowel sounds. Extremities:  no cyanosis, clubbing, rash, edema Neuro: alert & oriented x 3, cranial nerves grossly intact. moves all 4 extremities w/o difficulty. Affect pleasant.  ECG: Normal sinus rhythm, upsloping ST depression and T-wave inversions in the infero-and lateral leads  Results for orders placed or performed during the hospital encounter of 05/27/16 (from the past 24 hour(s))  CBC     Status: Abnormal   Collection Time: 05/27/16  1:55 AM  Result Value Ref Range   WBC 8.7 3.6 - 11.0 K/uL   RBC 4.06 3.80 - 5.20 MIL/uL   Hemoglobin 11.1 (L) 12.0 - 16.0 g/dL   HCT 32.8 (L) 35.0 - 47.0 %   MCV 80.8 80.0 - 100.0 fL   MCH 27.3 26.0 - 34.0 pg   MCHC 33.8 32.0 - 36.0 g/dL   RDW 14.6 (H) 11.5 - 14.5 %   Platelets 190 150 - 440 K/uL  Basic metabolic panel     Status: Abnormal   Collection Time: 05/27/16  1:55 AM  Result Value Ref Range   Sodium 141 135 - 145 mmol/L   Potassium 3.1 (L)  3.5 - 5.1 mmol/L   Chloride 106 101 - 111 mmol/L   CO2 26 22 - 32 mmol/L   Glucose, Bld 153 (H) 65 - 99 mg/dL   BUN 14 6 - 20 mg/dL   Creatinine, Ser 0.95 0.44 - 1.00 mg/dL   Calcium 8.8 (L) 8.9 - 10.3 mg/dL   GFR calc non Af Amer >60 >60 mL/min   GFR calc Af Amer >60 >60 mL/min   Anion gap 9 5 - 15  Troponin I     Status: Abnormal   Collection Time: 05/27/16  1:55 AM  Result Value Ref Range   Troponin I 0.30 (HH) <0.03 ng/mL  Magnesium     Status: None   Collection Time: 05/27/16  1:55 AM  Result Value Ref Range   Magnesium 1.8 1.7 - 2.4 mg/dL  TSH     Status: None   Collection Time: 05/27/16  1:55 AM  Result Value Ref Range   TSH 0.842 0.350 - 4.500 uIU/mL   Dg Chest 2 View  Result Date: 05/27/2016 CLINICAL DATA:  Palpitations and chest pain. Dizziness and weakness. Symptom onset tonight. EXAM: CHEST  2 VIEW COMPARISON:  08/02/2013 FINDINGS: Mild cardiomegaly and chronic interstitial opacities are stable from prior exam. Mediastinal contours are stable. No focal airspace opacity, pleural effusion or pulmonary edema. Tiny nodular opacity in the right lung base is unchanged and may be a calcified granuloma. No pneumothorax. Remote right rib fracture. IMPRESSION: Stable mild cardiomegaly and chronic interstitial change. No acute abnormality is seen. Electronically Signed   By: Jeb Levering M.D.   On: 05/27/2016 02:20     ASSESSMENT AND PLAN: Non-STEMI with elevated troponin of 0.30 and inferolateral ischemia per EKG. The patient had mild chest discomfort with no recurrence. Awaiting cycling troponins. If troponins trending upward will need cardiac cath. If troponins reduced or flat and patient condition stable can discharge this afternoon and will plan to do coronary CT angiography in the office tomorrow morning.  Daune Perch, NP 05/27/2016 8:39 AM

## 2016-05-27 NOTE — ED Notes (Addendum)
Pt reports that she is dizzy and weak with sweating and heart palpitations - she felt like her heart was beating fast - pt reports vomiting - these symptoms started at 10pm - pt states prior to coming to the ER she was having chest pain that radiated into neck and left arm but at this time denies any pain - pt family reports that she was having shortness of breath when pt felt heart palpitations but pt denies shortness of breath at this time and respirations are even and unlabored

## 2016-05-27 NOTE — Progress Notes (Signed)
Pt clinically stable post heart cath per Dr Humphrey Rolls with pci stent to lcx per Dr Clayborn Bigness, pt stable post  Procedure, right groin stable  Without bleeding nor hematoma at right groin site, report called to floor Arthur on telemetry with plan reviewed. Pt voided in bedpan qs without difficulty

## 2016-05-27 NOTE — Consult Note (Signed)
ANTICOAGULATION CONSULT NOTE - Initial Consult  Pharmacy Consult for lovenox Indication: chest pain/ACS  No Known Allergies  Patient Measurements: Height: 5\' 2"  (157.5 cm) Weight: 150 lb 4.8 oz (68.2 kg) IBW/kg (Calculated) : 50.1 Heparin Dosing Weight:   Vital Signs: Temp: 97.9 F (36.6 C) (08/02 0529) Temp Source: Oral (08/02 0529) BP: 139/78 (08/02 0529) Pulse Rate: 81 (08/02 0529)  Labs:  Recent Labs  05/27/16 0155  HGB 11.1*  HCT 32.8*  PLT 190  CREATININE 0.95  TROPONINI 0.30*    Estimated Creatinine Clearance: 57.7 mL/min (by C-G formula based on SCr of 0.95 mg/dL).   Medical History: Past Medical History:  Diagnosis Date  . Essential hypertension   . HLD (hyperlipidemia)     Medications:  Scheduled:  . amitriptyline  50 mg Oral QHS  . aspirin EC  81 mg Oral Daily  . docusate sodium  100 mg Oral BID  . enoxaparin (LOVENOX) injection  30 mg Subcutaneous STAT  . enoxaparin (LOVENOX) injection  1 mg/kg Subcutaneous Q12H  . hydrochlorothiazide  25 mg Oral Daily  . pantoprazole  40 mg Oral Daily  . simvastatin  20 mg Oral QHS  . sodium chloride flush  3 mL Intravenous Q12H    Assessment: Pt is a 50 year old female who presents with chest pain, SOB, mildly elevated troponin and ST segment depressions in her inferior leads. Pharmacy consulted to dose therapeutic lovenox. Patient received one dose of lovenox 40mg  at 0544 this AM.  Goal of Therapy:  Anti-Xa level 0.6-1 units/ml 4hrs after LMWH dose given Monitor platelets by anticoagulation protocol: Yes   Plan:  Lovenox 1mg /kg q 12 hours. Will give an additional 30mg  Now since pt received 40mg  this AM for a total dose of 70mg .   Ramond Dial, Pharm.D Clinical Pharmacist  05/27/2016,8:21 AM

## 2016-05-27 NOTE — ED Notes (Signed)
MD at the bedside to discuss plan of care.  

## 2016-05-27 NOTE — Progress Notes (Signed)
Second troponin elevated. Discussed with Dr. Humphrey Rolls. Will do cardiac cath today. Planned for 1:30.

## 2016-05-27 NOTE — Progress Notes (Signed)
Patient transferred to cardiac cath lab.

## 2016-05-27 NOTE — ED Triage Notes (Signed)
Pt co palpitations, dizziness, weakness, and vomiting that started tonight.

## 2016-05-27 NOTE — Progress Notes (Signed)
Patient transported to floor. RN Crystal at bedside to assess groin with this RN.

## 2016-05-27 NOTE — ED Notes (Signed)
Assisted back to bed and provided for comfort and safety.

## 2016-05-28 DIAGNOSIS — I10 Essential (primary) hypertension: Secondary | ICD-10-CM

## 2016-05-28 DIAGNOSIS — R079 Chest pain, unspecified: Secondary | ICD-10-CM

## 2016-05-28 LAB — BASIC METABOLIC PANEL
ANION GAP: 4 — AB (ref 5–15)
BUN: 12 mg/dL (ref 6–20)
CALCIUM: 8.5 mg/dL — AB (ref 8.9–10.3)
CO2: 26 mmol/L (ref 22–32)
CREATININE: 0.79 mg/dL (ref 0.44–1.00)
Chloride: 110 mmol/L (ref 101–111)
GFR calc Af Amer: >60 mL/min (ref >60)
GLUCOSE: 97 mg/dL (ref 65–99)
Potassium: 4.1 mmol/L (ref 3.5–5.1)
Sodium: 140 mmol/L (ref 135–145)

## 2016-05-28 LAB — ECHOCARDIOGRAM COMPLETE
Height: 62 in
Weight: 2404.8 oz

## 2016-05-28 MED ORDER — ASPIRIN 81 MG PO TBEC: 81.0000 mg | DELAYED_RELEASE_TABLET | Freq: Every day | ORAL | 3 refills | Status: DC

## 2016-05-28 MED ORDER — TICAGRELOR 90 MG PO TABS: 90.0000 mg | ORAL_TABLET | Freq: Two times a day (BID) | ORAL | 3 refills | Status: DC

## 2016-05-28 MED ORDER — METOPROLOL TARTRATE 25 MG PO TABS: 25.0000 mg | ORAL_TABLET | Freq: Two times a day (BID) | ORAL | 2 refills | Status: DC

## 2016-05-28 MED ORDER — ATORVASTATIN CALCIUM 80 MG PO TABS: 80.0000 mg | ORAL_TABLET | Freq: Every day | ORAL | 3 refills | Status: DC

## 2016-05-28 MED ORDER — LISINOPRIL 10 MG PO TABS: 10.0000 mg | ORAL_TABLET | Freq: Every day | ORAL | 2 refills | Status: DC

## 2016-05-28 MED ORDER — NITROGLYCERIN 0.4 MG SL SUBL: 0.4000 mg | SUBLINGUAL_TABLET | SUBLINGUAL | 3 refills | Status: DC | PRN

## 2016-05-28 NOTE — Progress Notes (Signed)
A & O. Room air. NSr. Takes meds ok. Pt has not complained of any pain. IV and tele remvoed. Prescriptions given to pt. Discharge instructions given to pt. Pt has no further concerns at this time.

## 2016-05-28 NOTE — Care Management (Signed)
Patient has been started on Brilinta s/p cardiac pci.  She verbally confirms that she has pharmacy coverage through her insurance.  Provided her with Brilinta coupon.  No other discharge needs identified at present

## 2016-05-28 NOTE — Discharge Summary (Signed)
[facility] [place] NAME: Chelsey Wilson    MR#:  [id number]  DATE OF BIRTH:  09/12/1957  DATE OF ADMISSION:  05/27/2016   ADMITTING PHYSICIAN: Harrie Foreman, MD  DATE OF DISCHARGE: 05/28/2016  PRIMARY CARE PHYSICIAN: Maren Reamer, MD   ADMISSION DIAGNOSIS:   Palpitations [R00.2] Acute coronary syndrome ([place]) [I24.9]  DISCHARGE DIAGNOSIS:   Active Problems:   Chest pain   NSTEMI (non-ST elevated myocardial infarction) ([place])   SECONDARY DIAGNOSIS:   Past Medical History:  Diagnosis Date  . Essential hypertension   . HLD (hyperlipidemia)     HOSPITAL COURSE:   50 year old very pleasant female with past medical history significant for hyperlipidemia and hypertension admitted for chest pain.  #1 NSTEMI- appreciate cardiology consult - s/p cardiac cath and had 90% LCX lesion- s/p stent placed 05/27/16 -- started on asa and brilinta, LDL elevated at 174 - added metoprolol, lisinopril and changed statin to high intensity statin - prn nitro tabs  #2 HTN- discontinue HCTZ, started metoprolol and lisinopril  #3 Hypokalemia- replaced, HCTZ discontinued  #4 Depression- amitriptyline  Stable for discharge today  DISCHARGE CONDITIONS:   Stable  CONSULTS OBTAINED:   Treatment Team:  Dionisio David, MD  DRUG ALLERGIES:   No Known Allergies DISCHARGE MEDICATIONS:     Medication List    STOP taking these medications   hydrochlorothiazide 25 MG tablet Commonly known as:  HYDRODIURIL   simvastatin 20 MG tablet Commonly known as:  ZOCOR     TAKE these medications   amitriptyline 50 MG tablet Commonly known as:  ELAVIL Take 1 tablet (50 mg total) by mouth at bedtime.   aspirin 81 MG EC tablet Take 1 tablet (81 mg total) by mouth daily.   atorvastatin 80 MG tablet Commonly known as:  LIPITOR Take 1 tablet (80 mg total) by mouth daily at 6 PM.   lisinopril 10 MG tablet Commonly known as:   PRINIVIL Take 1 tablet (10 mg total) by mouth daily.   metoprolol tartrate 25 MG tablet Commonly known as:  LOPRESSOR Take 1 tablet (25 mg total) by mouth 2 (two) times daily.   nitroGLYCERIN 0.4 MG SL tablet Commonly known as:  NITROSTAT Place 1 tablet (0.4 mg total) under the tongue every 5 (five) minutes as needed for chest pain.   ticagrelor 90 MG Tabs tablet Commonly known as:  BRILINTA Take 1 tablet (90 mg total) by mouth 2 (two) times daily.        DISCHARGE INSTRUCTIONS:    DIET:   Low sodium diet  ACTIVITY:   As tolerated  OXYGEN:   Home Oxygen: No.  Oxygen Delivery: room air  DISCHARGE LOCATION:   home   If you experience worsening of your admission symptoms, develop shortness of breath, life threatening emergency, suicidal or homicidal thoughts you must seek medical attention immediately by calling 911 or calling your MD immediately  if symptoms less severe.  You Must read complete instructions/literature along with all the possible adverse reactions/side effects for all the Medicines you take and that have been prescribed to you. Take any new Medicines after you have completely understood and accpet all the possible adverse reactions/side effects.   Please note  You were cared for by a hospitalist during your hospital stay. If you have any questions about your discharge medications or the care you received while you were in the hospital after you are discharged, you can call the unit  and asked to speak with the hospitalist on call if the hospitalist that took care of you is not available. Once you are discharged, your primary care physician will handle any further medical issues. Please note that NO REFILLS for any discharge medications will be authorized once you are discharged, as it is imperative that you return to your primary care physician (or establish a relationship with a primary care physician if you do not have one) for your aftercare needs so that  they can reassess your need for medications and monitor your lab values.    On the day of Discharge:  VITAL SIGNS:   Blood pressure 131/82, pulse 72, temperature 98.3 F (36.8 C), temperature source Oral, resp. rate 20, height 5\' 2"  (1.575 m), weight 67.2 kg (148 lb 3.2 oz), SpO2 98 %.  PHYSICAL EXAMINATION:    GENERAL:  50 y.o.-year-old patient lying in the bed with no acute distress.  EYES: Pupils equal, round, reactive to light and accommodation. No scleral icterus. Extraocular muscles intact.  HEENT: Head atraumatic, normocephalic. Oropharynx and nasopharynx clear.  NECK:  Supple, no jugular venous distention. No thyroid enlargement, no tenderness.  LUNGS: Normal breath sounds bilaterally, no wheezing, rales,rhonchi or crepitation. No use of accessory muscles of respiration.  CARDIOVASCULAR: S1, S2 normal. No murmurs, rubs, or gallops.  ABDOMEN: Soft, nontender, nondistended. Bowel sounds present. No organomegaly or mass.  EXTREMITIES: No pedal edema, cyanosis, or clubbing. Dressing in right groin with no bleeding. NEUROLOGIC: Cranial nerves II through XII are intact. Muscle strength 5/5 in all extremities. Sensation intact. Gait not checked.  PSYCHIATRIC: The patient is alert and oriented x 3.  SKIN: No obvious rash, lesion, or ulcer.    DATA REVIEW:   CBC  Recent Labs Lab 05/27/16 0155  WBC 8.7  HGB 11.1*  HCT 32.8*  PLT 190    Chemistries   Recent Labs Lab 05/27/16 0155 05/28/16 0449  NA 141 140  K 3.1* 4.1  CL 106 110  CO2 26 26  GLUCOSE 153* 97  BUN 14 12  CREATININE 0.95 0.79  CALCIUM 8.8* 8.5*  MG 1.8  --      Microbiology Results  Results for orders placed or performed during the hospital encounter of 08/02/13  Culture, blood (routine x 2)     Status: None   Collection Time: 08/02/13  3:15 AM  Result Value Ref Range Status   Specimen Description BLOOD LEFT ARM  Final   Special Requests BOTTLES DRAWN AEROBIC ONLY [place]  Final   Culture  Setup  Time   Final    08/02/2013 08:52 Performed at [facility]   Culture   Final    ESCHERICHIA COLI Note: SUSCEPTIBILITIES PERFORMED ON PREVIOUS CULTURE WITHIN THE LAST 5 DAYS. Note: Gram Stain Report Called to,Read Back By and Verified With: REBECCA SPARKS ON 08/03/2013 AT 12:31A BY Dennard Nip Performed at [facility]   Report Status 08/04/2013 FINAL  Final  Culture, blood (routine x 2)     Status: None   Collection Time: 08/02/13  3:25 AM  Result Value Ref Range Status   Specimen Description BLOOD LEFT HAND  Final   Special Requests BOTTLES DRAWN AEROBIC ONLY [facility]  Final   Culture  Setup Time   Final    08/02/2013 08:52 Performed at [facility]   Culture   Final    ESCHERICHIA COLI Note: Gram Stain Report Called to,Read Back By and Verified With: REBECCA SPARKS ON 08/03/2013 AT 12:31A BY Dennard Nip  Performed at [facility]   Report Status 08/04/2013 FINAL  Final   Organism ID, Bacteria ESCHERICHIA COLI  Final      Susceptibility   Escherichia coli - MIC*    AMPICILLIN >=32 RESISTANT Resistant     AMPICILLIN/SULBACTAM >=32 RESISTANT Resistant     CEFAZOLIN <=4 SENSITIVE Sensitive     CEFEPIME <=1 SENSITIVE Sensitive     CEFTAZIDIME <=1 SENSITIVE Sensitive     CEFTRIAXONE <=1 SENSITIVE Sensitive     CIPROFLOXACIN <=0.25 SENSITIVE Sensitive     GENTAMICIN <=1 SENSITIVE Sensitive     IMIPENEM <=0.25 SENSITIVE Sensitive     PIP/TAZO <=4 SENSITIVE Sensitive     TOBRAMYCIN <=1 SENSITIVE Sensitive     TRIMETH/SULFA >=320 RESISTANT Resistant     CEFOXITIN <=4 SENSITIVE Sensitive     * ESCHERICHIA COLI  Urine culture     Status: None   Collection Time: 08/02/13  5:03 AM  Result Value Ref Range Status   Specimen Description URINE, CLEAN CATCH  Final   Special Requests NONE  Final   Culture  Setup Time   Final    08/02/2013 08:53 Performed at [facility]   Final    >=100,000 COLONIES/ML Performed at [facility]   Culture    Final    ESCHERICHIA COLI Performed at [facility]   Report Status 08/03/2013 FINAL  Final   Organism ID, Bacteria ESCHERICHIA COLI  Final      Susceptibility   Escherichia coli - MIC*    AMPICILLIN >=32 RESISTANT Resistant     CEFAZOLIN <=4 SENSITIVE Sensitive     CEFTRIAXONE <=1 SENSITIVE Sensitive     CIPROFLOXACIN <=0.25 SENSITIVE Sensitive     GENTAMICIN <=1 SENSITIVE Sensitive     LEVOFLOXACIN <=0.12 SENSITIVE Sensitive     NITROFURANTOIN 32 SENSITIVE Sensitive     TOBRAMYCIN <=1 SENSITIVE Sensitive     TRIMETH/SULFA >=320 RESISTANT Resistant     PIP/TAZO <=4 SENSITIVE Sensitive     * ESCHERICHIA COLI  Culture, blood (routine x 2)     Status: None   Collection Time: 08/04/13  1:15 PM  Result Value Ref Range Status   Specimen Description BLOOD LEFT HAND  Final   Special Requests BOTTLES DRAWN AEROBIC ONLY 5[facility]  Final   Culture  Setup Time   Final    08/04/2013 16:20 Performed at [facility]   Culture   Final    NO GROWTH 5 DAYS Performed at [facility]   Report Status 08/10/2013 FINAL  Final  Culture, blood (routine x 2)     Status: None   Collection Time: 08/04/13  1:20 PM  Result Value Ref Range Status   Specimen Description BLOOD RIGHT ARM  Final   Special Requests BOTTLES DRAWN AEROBIC ONLY 3[facility]  Final   Culture  Setup Time   Final    08/04/2013 16:20 Performed at [place]   Final    NO GROWTH 5 DAYS Performed at [facility]   Report Status 08/10/2013 FINAL  Final    RADIOLOGY:  No results found.   Management plans discussed with the patient, family and they are in agreement.  CODE STATUS:     Code Status Orders        Start     Ordered   05/27/16 1827  Full code  Continuous     05/27/16 1826    Code Status History  Date Active Date Inactive Code Status Order ID Comments User Context   05/27/2016  6:26 PM 05/27/2016  7:43 PM Full Code [id number]  Yolonda Kida, MD Inpatient   05/27/2016   5:28 AM 05/27/2016  1:09 PM Full Code [id number]  Harrie Foreman, MD Inpatient   08/02/2013  8:11 AM 08/04/2013  4:34 PM Full Code [id number]  Rise Patience, MD Inpatient      TOTAL TIME TAKING CARE OF THIS PATIENT: 37 minutes.    Gladstone Lighter M.D on 05/28/2016 at 9:22 AM  Between 7am to 6pm - Pager - [phone]  After 6pm go to [email or web address] - Proofreader  [facility]  Office  [phone]  CC: Primary care physician; Maren Reamer, MD   Note: This dictation was prepared with Dragon dictation along with smaller phrase technology. Any transcriptional errors that result from this process are unintentional.

## 2016-05-28 NOTE — Progress Notes (Signed)
SUBJECTIVE: Patient denies any chest pain or shortness of breath   Vitals:   05/27/16 1800 05/27/16 1830 05/27/16 1956 05/28/16 0515  BP: (!) 145/96 139/85 140/84 131/82  Pulse: 67 74 83 72  Resp: 18 17 16 20   Temp:  97.8 F (36.6 C) 98.1 F (36.7 C) 98.3 F (36.8 C)  TempSrc:  Oral Oral Oral  SpO2: 98% 98% 97% 98%  Weight:    148 lb 3.2 oz (67.2 kg)  Height:        Intake/Output Summary (Last 24 hours) at 05/28/16 0900 Last data filed at 05/28/16 0515  Gross per 24 hour  Intake                0 ml  Output             2800 ml  Net            -2800 ml    LABS: Basic Metabolic Panel:  Recent Labs  05/27/16 0155 05/28/16 0449  NA 141 140  K 3.1* 4.1  CL 106 110  CO2 26 26  GLUCOSE 153* 97  BUN 14 12  CREATININE 0.95 0.79  CALCIUM 8.8* 8.5*  MG 1.8  --    Liver Function Tests: No results for input(s): AST, ALT, ALKPHOS, BILITOT, PROT, ALBUMIN in the last 72 hours. No results for input(s): LIPASE, AMYLASE in the last 72 hours. CBC:  Recent Labs  05/27/16 0155  WBC 8.7  HGB 11.1*  HCT 32.8*  MCV 80.8  PLT 190   Cardiac Enzymes:  Recent Labs  05/27/16 0613 05/27/16 1130 05/27/16 1839  TROPONINI 3.39* 4.10* 3.44*   BNP: Invalid input(s): POCBNP D-Dimer: No results for input(s): DDIMER in the last 72 hours. Hemoglobin A1C:  Recent Labs  05/27/16 0613  HGBA1C 6.1*   Fasting Lipid Panel:  Recent Labs  05/27/16 1130  CHOL 241*  HDL 41  LDLCALC 174*  TRIG 132  CHOLHDL 5.9   Thyroid Function Tests:  Recent Labs  05/27/16 0613  TSH 0.488   Anemia Panel: No results for input(s): VITAMINB12, FOLATE, FERRITIN, TIBC, IRON, RETICCTPCT in the last 72 hours.   PHYSICAL EXAM General: Well developed, well nourished, in no acute distress HEENT:  Normocephalic and atramatic Neck:  No JVD.  Lungs: Clear bilaterally to auscultation and percussion. Heart: HRRR . Normal S1 and S2 without gallops or murmurs.  Abdomen: Bowel sounds are  positive, abdomen soft and non-tender  Msk:  Back normal, normal gait. Normal strength and tone for age. Extremities: No clubbing, cyanosis or edema.   Neuro: Alert and oriented X 3. Psych:  Good affect, responds appropriately  TELEMETRY:Sinus rhythm  ASSESSMENT AND PLAN: Status post PCI and stenting with drug-eluting stent of mid left circumflex which was treated from 90% to 0%. Patient is stable and mid be discharged with follow-up in the office Monday at 11:00.  Active Problems:   Chest pain   NSTEMI (non-ST elevated myocardial infarction) ([place])    Chelsey Wilson A, MD, [facility] 05/28/2016 9:00 AM

## 2016-05-29 ENCOUNTER — Encounter: Payer: Self-pay | Admitting: Cardiovascular Disease

## 2016-06-02 ENCOUNTER — Encounter: Payer: Self-pay | Admitting: Podiatry

## 2016-06-02 ENCOUNTER — Ambulatory Visit (INDEPENDENT_AMBULATORY_CARE_PROVIDER_SITE_OTHER): Payer: 59 | Admitting: Podiatry

## 2016-06-02 VITALS — BP 117/73 | HR 101 | Resp 18

## 2016-06-02 DIAGNOSIS — M799 Soft tissue disorder, unspecified: Secondary | ICD-10-CM | POA: Diagnosis not present

## 2016-06-02 DIAGNOSIS — M7989 Other specified soft tissue disorders: Secondary | ICD-10-CM

## 2016-06-02 NOTE — Progress Notes (Signed)
   Subjective:    Patient ID: Markella Lamphier, female    DOB: 09/12/1957, 50 y.o.   MRN: [id number]  HPI  50 year old female presents the office today for concerns of a soft tissue mass on the right big toe which is been ongoing for about 6 months. She states the area bleeds and is painful with pressure in shoes. She said no significant treatment to this area. She states the mass has been growing. No other complaints at this time.  Review of Systems  All other systems reviewed and are negative.      Objective:   Physical Exam General: AAO x3, NAD  Dermatological: On the plantar aspect of the right hallux is a hyperpigmented cauliflower type lesion. Upon removal there was bleeding and the lesion appears to be going deep. Because of this the area was anesthetized for biopsy of the lesion. See procedure note below.  Vascular: DP/PT pulses 2/4, CRT less than 3 seconds  There is no pain with calf compression, swelling, warmth, erythema.   Neruologic: Sensation appears to be intact with Derrel Nip monofilament   Musculoskeletal: tenderness to palpation to the upon the hyperpigmented lesion plantar aspect right hallux. No other areas of tenderness bilaterally.  No gross boney pedal deformities bilateral. No pain, crepitus, or limitation noted with foot and ankle range of motion bilateral. Muscular strength 5/5 in all groups tested bilateral.  Gait: Unassisted, Nonantalgic.       Assessment & Plan:   50 year old female right plantar hallux soft tissue mass  -Treatment options discussed including all alternatives, risks, and complications -Etiology of symptoms were discussed -At this time given that the lesion appeared to be going deep recommended anesthetized the area for biopsy. I discussed with her risks, complications of biopsy and she understands this and wishes to proceed. Under sterile conditions a mixture of lidocaine and Marcaine mixture was infiltrated in a hallux block fashion to  the right hallux. The lesion was excised with a scalpel and appeared to be going through the epidermis and dermis. No extension of hyperpigmented skin over as able to visualize. The area was cleaned and hemostasis was achieved. Silvadene was applied followed by dressing. This procedure tractions were discussed. Lesion was sent for biopsy. Lesion measured 1 x 0.9 cm. -Follow-up in 1 week or sooner if any issues are to arise. Positive signs or symptoms of infection to cure the ER should any occur call the office.   Celesta Gentile, DPM

## 2016-06-03 ENCOUNTER — Other Ambulatory Visit: Payer: Self-pay | Admitting: Podiatry

## 2016-06-03 NOTE — Addendum Note (Signed)
Addended by: Cranford Mon R on: 06/03/2016 09:15 AM   Modules accepted: Orders

## 2016-06-09 ENCOUNTER — Ambulatory Visit (INDEPENDENT_AMBULATORY_CARE_PROVIDER_SITE_OTHER): Payer: 59 | Admitting: Podiatry

## 2016-06-09 ENCOUNTER — Encounter: Payer: Self-pay | Admitting: Podiatry

## 2016-06-09 DIAGNOSIS — M799 Soft tissue disorder, unspecified: Secondary | ICD-10-CM

## 2016-06-09 DIAGNOSIS — M7989 Other specified soft tissue disorders: Secondary | ICD-10-CM

## 2016-06-09 LAB — PATHOLOGY

## 2016-06-09 NOTE — Patient Instructions (Signed)

## 2016-06-14 NOTE — Progress Notes (Signed)
Subjective: 50 year old female presents the also for follow-up evaluation of right big toe soft tissue mass excision. She said that she is doing well. She has been years point a Band-Aid on the area daily. She has not been soaking. Denies any redness or drainage trace swelling to the toe. Her pain has improved and she has not have any pain at this time. Denies any drainage or pus. Denies any systemic complaints such as fevers, chills, nausea, vomiting. No acute changes since last appointment, and no other complaints at this time.   Objective: AAO x3, NAD DP/PT pulses palpable bilaterally, CRT less than 3 seconds On the plantar aspect of the right hallux is a granular superficial wound today measures approximately 0.5 x 0.5 cm. There is no probing, undermining or tunneling. There is no edema, erythema, drainage or pus. No malodor. No clinical signs of infection. No edema, erythema, increase in warmth to bilateral lower extremities.  No open lesions or pre-ulcerative lesions.  No pain with calf compression, swelling, warmth, erythema  Assessment: Healing wound right plantar hallux status post soft tissue mass excision  Plan: -All treatment options discussed with the patient including all alternatives, risks, complications.  -Recommended Epsom salts soaks daily, Neosporin and a Band-Aid during the day but can leave the area uncovered at night. Awaiting biopsy results. Continue to monitor for any signs or symptoms of infection to call the office and medially should any occur or go to the ER. Follow up in 2 weeks or sooner if needed. -Patient encouraged to call the office with any questions, concerns, change in symptoms.   Celesta Gentile, DPM

## 2016-06-15 ENCOUNTER — Inpatient Hospital Stay: Payer: 59 | Admitting: Internal Medicine

## 2016-06-23 ENCOUNTER — Encounter: Payer: Self-pay | Admitting: Podiatry

## 2016-06-23 ENCOUNTER — Ambulatory Visit (INDEPENDENT_AMBULATORY_CARE_PROVIDER_SITE_OTHER): Payer: 59 | Admitting: Podiatry

## 2016-06-23 DIAGNOSIS — M799 Soft tissue disorder, unspecified: Secondary | ICD-10-CM

## 2016-06-23 DIAGNOSIS — M7989 Other specified soft tissue disorders: Secondary | ICD-10-CM

## 2016-06-30 NOTE — Progress Notes (Signed)
Subjective: 50 year old female presents the also for follow-up evaluation of right big toe soft tissue mass excision. She states that she is still having no pain and she has not is any recurrence of the mass. She denies any drainage or pus and surrounding redness or swelling. She has no concerns today. Denies any systemic complaints such as fevers, chills, nausea, vomiting. No acute changes since last appointment, and no other complaints at this time.   Objective: AAO x3, NAD DP/PT pulses palpable bilaterally, CRT less than 3 seconds On the plantar aspect of the right hallux is a granular superficial wound which is almost healed and appears to be abrasion type lesion. There is no probing, undermining or tunneling. No tenderness. There is no swelling or redness the toe. There is no fluctuance or crepitus there is no malodor. No identifiable soft tissue mass present today. No other open lesions or pre-ulcer lesions identified this time. No pain with calf compression, swelling, warmth, erythema  Assessment: Healing wound right plantar hallux status post soft tissue mass excision  Plan: -All treatment options discussed with the patient including all alternatives, risks, complications.  -I discussed with a biopsy results and I recommended to remove further tissue to assure that all margins are clean. However she does not want to proceed with any further surgical excision at this time. She wishes to watch and wait to see this any recurrence or any issues. I discussed with her the consequences of doing this and she understands and she wishes to hold off. We will continue to monitor the area closely. This any changes she is to call the office. She verbally understood this. -Patient encouraged to call the office with any questions, concerns, change in symptoms.   Celesta Gentile, DPM

## 2016-07-28 ENCOUNTER — Ambulatory Visit: Payer: 59 | Attending: Internal Medicine | Admitting: Internal Medicine

## 2016-07-28 ENCOUNTER — Encounter: Payer: Self-pay | Admitting: Internal Medicine

## 2016-07-28 ENCOUNTER — Other Ambulatory Visit: Payer: Self-pay

## 2016-07-28 VITALS — BP 121/79 | HR 72 | Temp 98.0°F | Resp 16 | Wt 140.2 lb

## 2016-07-28 DIAGNOSIS — Z1239 Encounter for other screening for malignant neoplasm of breast: Secondary | ICD-10-CM

## 2016-07-28 DIAGNOSIS — Z1231 Encounter for screening mammogram for malignant neoplasm of breast: Secondary | ICD-10-CM | POA: Diagnosis not present

## 2016-07-28 DIAGNOSIS — Z1211 Encounter for screening for malignant neoplasm of colon: Secondary | ICD-10-CM | POA: Diagnosis not present

## 2016-07-28 DIAGNOSIS — M79602 Pain in left arm: Secondary | ICD-10-CM | POA: Diagnosis not present

## 2016-07-28 DIAGNOSIS — I110 Hypertensive heart disease with heart failure: Secondary | ICD-10-CM | POA: Diagnosis not present

## 2016-07-28 DIAGNOSIS — I252 Old myocardial infarction: Secondary | ICD-10-CM | POA: Insufficient documentation

## 2016-07-28 DIAGNOSIS — D4989 Neoplasm of unspecified behavior of other specified sites: Secondary | ICD-10-CM | POA: Insufficient documentation

## 2016-07-28 DIAGNOSIS — Z955 Presence of coronary angioplasty implant and graft: Secondary | ICD-10-CM | POA: Insufficient documentation

## 2016-07-28 DIAGNOSIS — E785 Hyperlipidemia, unspecified: Secondary | ICD-10-CM | POA: Insufficient documentation

## 2016-07-28 DIAGNOSIS — Z23 Encounter for immunization: Secondary | ICD-10-CM | POA: Insufficient documentation

## 2016-07-28 DIAGNOSIS — R634 Abnormal weight loss: Secondary | ICD-10-CM | POA: Insufficient documentation

## 2016-07-28 DIAGNOSIS — Z7982 Long term (current) use of aspirin: Secondary | ICD-10-CM | POA: Diagnosis not present

## 2016-07-28 DIAGNOSIS — C44702 Unspecified malignant neoplasm of skin of right lower limb, including hip: Secondary | ICD-10-CM | POA: Insufficient documentation

## 2016-07-28 DIAGNOSIS — I214 Non-ST elevation (NSTEMI) myocardial infarction: Secondary | ICD-10-CM | POA: Diagnosis not present

## 2016-07-28 LAB — CBC WITH DIFFERENTIAL/PLATELET
BASOS ABS: 0 {cells}/uL (ref 0–200)
Basophils Relative: 0 %
EOS ABS: 47 {cells}/uL (ref 15–500)
Eosinophils Relative: 1 %
HCT: 36.6 % (ref 35.0–45.0)
HEMOGLOBIN: 12 g/dL (ref 11.7–15.5)
Lymphocytes Relative: 43 %
Lymphs Abs: 2021 cells/uL (ref 850–3900)
MCH: 26.4 pg — AB (ref 27.0–33.0)
MCHC: 32.8 g/dL (ref 32.0–36.0)
MCV: 80.6 fL (ref 80.0–100.0)
MONOS PCT: 11 %
MPV: 11.2 fL (ref 7.5–12.5)
Monocytes Absolute: 517 cells/uL (ref 200–950)
NEUTROS ABS: 2115 {cells}/uL (ref 1500–7800)
NEUTROS PCT: 45 %
PLATELETS: 265 10*3/uL (ref 140–400)
RBC: 4.54 MIL/uL (ref 3.80–5.10)
RDW: 15.9 % — ABNORMAL HIGH (ref 11.0–15.0)
WBC: 4.7 10*3/uL (ref 3.8–10.8)

## 2016-07-28 MED ORDER — DICLOFENAC SODIUM 1 % TD GEL: 2.0000 g | Freq: Four times a day (QID) | TRANSDERMAL | 2 refills | Status: DC

## 2016-07-28 NOTE — Progress Notes (Addendum)
Chelsey Wilson, is a 50 y.o. female  [id number]  [id number]  DOB - 09/12/1957  Chief Complaint  Patient presents with  . Arm Pain        Subjective:   Chelsey Wilson is a 50 y.o. female here today for a follow up visit, w/ recent hx of NSTEMI, sp pci and stent to LCX 05/28/16, w/ hx of hld, htn, predm as well Pt states she is taking all her meds as prescribed.  She has lost 10 lbs since last seen in clinic, she does not want to lose any more weight. She asked if she can take any protein shakes, supplements, etc to maintain her weight.  She also c/o of "heaviness feeeling" on the lateral aspect of her left arm for the last 3 wks. Denies chest pain/sob/pain w/ lifing arm. Of note, she never had chest pain prior to her NSTEMI.   Patient has No headache, No chest pain, No abdominal pain - No Nausea, No new weakness tingling or numbness, No Cough - SOB.  No problems updated.  ALLERGIES: No Known Allergies  PAST MEDICAL HISTORY: Past Medical History:  Diagnosis Date  . Essential hypertension   . HLD (hyperlipidemia)     MEDICATIONS AT HOME: Prior to Admission medications   Medication Sig Start Date End Date Taking? Authorizing Provider  aspirin EC 81 MG EC tablet Take 1 tablet (81 mg total) by mouth daily. 05/28/16  Yes Gladstone Lighter, MD  atorvastatin (LIPITOR) 80 MG tablet Take 1 tablet (80 mg total) by mouth daily at 6 PM. 05/28/16  Yes Gladstone Lighter, MD  lisinopril (PRINIVIL) 10 MG tablet Take 1 tablet (10 mg total) by mouth daily. 05/28/16  Yes Gladstone Lighter, MD  metoprolol tartrate (LOPRESSOR) 25 MG tablet Take 1 tablet (25 mg total) by mouth 2 (two) times daily. 05/28/16  Yes Gladstone Lighter, MD  ticagrelor (BRILINTA) 90 MG TABS tablet Take 1 tablet (90 mg total) by mouth 2 (two) times daily. 05/28/16  Yes Gladstone Lighter, MD  nitroGLYCERIN (NITROSTAT) 0.4 MG SL tablet Place 1 tablet (0.4 mg total) under the tongue every 5 (five) minutes as needed for chest  pain. Patient not taking: Reported on 07/28/2016 05/28/16   Gladstone Lighter, MD     Objective:   Vitals:   07/28/16 0910  BP: 121/79  Pulse: 72  Resp: 16  Temp: 98 F (36.7 C)  TempSrc: Oral  SpO2: 100%  Weight: 140 lb 3.2 oz (63.6 kg)   bmi 25  Exam General appearance : Awake, alert, not in any distress. Speech Clear. Not toxic looking, pleasant HEENT: Atraumatic and Normocephalic, pupils equally reactive to light. Neck: supple, no JVD.  Chest:Good air entry bilaterally, no added sounds. CVS: S1 S2 regular, no murmurs/gallups or rubs. Abdomen: Bowel sounds active, obese, Non tender and not distended with no gaurding, rigidity or rebound. Extremities: B/L Lower Ext shows no edema, both legs are warm to touch nttp to palpation left arm, ms 5/5. Sensation intact. Neurology: Awake alert, and oriented X 3, CN II-XII grossly intact, Non focal Skin:No Rash  Data Review Lab Results  Component Value Date   HGBA1C 6.1 (H) 05/27/2016   HGBA1C 5.8 09/17/2014    Depression screen PHQ 2/9 07/28/2016 05/12/2016 11/11/2015 08/15/2015 03/18/2015  Decreased Interest 0 0 0 0 0  Down, Depressed, Hopeless 0 0 0 0 0  PHQ - 2 Score 0 0 0 0 0    ekg today/ nsr 64bpm, no stemi.  06/04/11 right great toe  bx Report   Comments: FINAL DIAGNOSIS:  A. Skin-Biopsy, Right big toe:     Poroma, fragmented  Comment: There is a neoplasm that is characterized by two populations of cells, namely:  poroid and cuticular cells. There are areas with ductal differentiation while the  epidermis shows areas of acanthosis papillomatosus and hyperkeratosis. The lesion extends  to all the margins and a re-excision of this neoplasm to ensure that it is completely  removed would be judicious.   Craige Cotta, M.D.  Electronically Signed  [address], [place], [place] [address]  CLINICAL HISTORY:  6 months growing mass R/O melanoma - possible wart, right side big toe  SOURCE OF SPECIMEN:    A: Skin-Biopsy, Right big toe  GROSS DESCRIPTION:  Received one part  Received in a single container of formalin labeled with two patient identifiers, patient  name and DOB, as well as "side of R big toe," is an aggregate of tan-brown tissue  fragments measuring 1.8 x 0.6 x 0.1 cm in aggregate dimensions. Specimen is filteredin a  nylon bag, submitted entirely in cassette A. OA/ah  Grossing performed at [facility], [address], [id number], [place], [place]  [address]. CLIA# [id number], Medical Director, Jackolyn Confer, M.D., Ph.D., Phone [phone]. This report contains confidential medical information. If you have received this  report, in error, please contact [phone]   [facility]   1. NSTEMI (non-ST elevated myocardial infarction) ([place]), sp pci/stent to Lcx 05/27/16 - ekg today unremarkable. - CBC with Differential/Platelet - BASIC METABOLIC PANEL WITH GFR - continue asa 81/brilanta 90bid/atorvastatin 80 qhs/lisinopril 10/lopressor 25bid   2. Left arm "heaviness" - no acute findings,  - EKG 12-Lead unremarkable - chk  CK r/o myalgias from Statin - trial voltarin gel prn  3. Loss of weight - reasurance provided, pt is at optimal bmi currently, perhaps slightly above it. 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 - he has dw w/ her risk/benefits at length for further surgical optiosn, but she wants to keep watching it.  5. Health maintenance - pt spent a lot of time fixated on ensures and supplements - spent considerable time today recd health maintenance testing that will help her, including DEXA (postmenopausa), depending on study results, may benefit from calc/vit d  or other trx if osteopnic - she will consider it. - recd colonscopy and MM screening, which I ordered both, dw her at length re benefits of screening, she will also consider this. - denies family  Hx of cancers  5. Encounter for immunization - Flu Vaccine QUAD 36+ mos IM  6. Colon cancer screening - Ambulatory referral to Gastroenterology - Prealbumin  7. Breast cancer screening - MM Digital Screening; Future  8. Poor insight   Patient have been counseled extensively about nutrition and exercise  Return in about 3 months (around 10/28/2016), or if symptoms worsen or fail to improve.  The patient was given clear instructions to go to ER or return to medical center if symptoms don't improve, worsen or new problems develop. The patient verbalized understanding. The patient was told to call to get lab results if they haven't heard anything in the next week.   This  note has been created with Surveyor, quantity. Any transcriptional errors are unintentional.   Maren Reamer, MD, [place] [facility] [place], [place]   07/28/2016, 12:32 PM     104/17 ck abnml, but lower than few years ago. Not certain if myopathy related to high dose statin for her recent cad/nstemi, has stent.  On review of f/u, did not see f/u w/ cards in her appt list.  amb ref cards placed, will dw cards when find out who she will see about high dose atorvastatin 80. Rest of labs still pending.

## 2016-07-28 NOTE — Progress Notes (Signed)
Pt is in the office today for right arm pain Pt states her arm feels heavy at the shoulder

## 2016-07-28 NOTE — Patient Instructions (Addendum)
Influenza Virus Vaccine injection (Fluarix) What is this medicine? INFLUENZA VIRUS VACCINE (in floo EN zuh VAHY ruhs vak SEEN) helps to reduce the risk of getting influenza also known as the flu. This medicine may be used for other purposes; ask your health care provider or pharmacist if you have questions. What should I tell my health care provider before I take this medicine? They need to know if you have any of these conditions: -bleeding disorder like hemophilia -fever or infection -Guillain-Barre syndrome or other neurological problems -immune system problems -infection with the human immunodeficiency virus (HIV) or AIDS -low blood platelet counts -multiple sclerosis -an unusual or allergic reaction to influenza virus vaccine, eggs, chicken proteins, latex, gentamicin, other medicines, foods, dyes or preservatives -pregnant or trying to get pregnant -breast-feeding How should I use this medicine? This vaccine is for injection into a muscle. It is given by a health care professional. A copy of Vaccine Information Statements will be given before each vaccination. Read this sheet carefully each time. The sheet may change frequently. Talk to your pediatrician regarding the use of this medicine in children. Special care may be needed. Overdosage: If you think you have taken too much of this medicine contact a poison control center or emergency room at once. NOTE: This medicine is only for you. Do not share this medicine with others. What if I miss a dose? This does not apply. What may interact with this medicine? -chemotherapy or radiation therapy -medicines that lower your immune system like etanercept, anakinra, infliximab, and adalimumab -medicines that treat or prevent blood clots like warfarin -phenytoin -steroid medicines like prednisone or cortisone -theophylline -vaccines This list may not describe all possible interactions. Give your health care provider a list of all the  medicines, herbs, non-prescription drugs, or dietary supplements you use. Also tell them if you smoke, drink alcohol, or use illegal drugs. Some items may interact with your medicine. What should I watch for while using this medicine? Report any side effects that do not go away within 3 days to your doctor or health care professional. Call your health care provider if any unusual symptoms occur within 6 weeks of receiving this vaccine. You may still catch the flu, but the illness is not usually as bad. You cannot get the flu from the vaccine. The vaccine will not protect against colds or other illnesses that may cause fever. The vaccine is needed every year. What side effects may I notice from receiving this medicine? Side effects that you should report to your doctor or health care professional as soon as possible: -allergic reactions like skin rash, itching or hives, swelling of the face, lips, or tongue Side effects that usually do not require medical attention (report to your doctor or health care professional if they continue or are bothersome): -fever -headache -muscle aches and pains -pain, tenderness, redness, or swelling at site where injected -weak or tired This list may not describe all possible side effects. Call your doctor for medical advice about side effects. You may report side effects to FDA at 1-800-FDA-1088. Where should I keep my medicine? This vaccine is only given in a clinic, pharmacy, doctor's office, or other health care setting and will not be stored at home. NOTE: This sheet is a summary. It may not cover all possible information. If you have questions about this medicine, talk to your doctor, pharmacist, or health care provider.    2016, Elsevier/Gold Standard. (2008-05-09 09:30:40)  - Menopause is a normal process in  which your reproductive ability comes to an end. This process happens gradually over a span of months to years, usually between the ages of 17 and 37.  Menopause is complete when you have missed 12 consecutive menstrual periods. It is important to talk with your health care provider about some of the most common conditions that affect postmenopausal women, such as heart disease, cancer, and bone loss (osteoporosis). Adopting a healthy lifestyle and getting preventive care can help to promote your health and wellness. Those actions can also lower your chances of developing some of these common conditions. WHAT SHOULD I KNOW ABOUT MENOPAUSE? During menopause, you may experience a number of symptoms, such as:  Moderate-to-severe hot flashes.  Night sweats.  Decrease in sex drive.  Mood swings.  Headaches.  Tiredness.  Irritability.  Memory problems.  Insomnia. Choosing to treat or not to treat menopausal changes is an individual decision that you make with your health care provider. WHAT SHOULD I KNOW ABOUT HORMONE REPLACEMENT THERAPY AND SUPPLEMENTS? Hormone therapy products are effective for treating symptoms that are associated with menopause, such as hot flashes and night sweats. Hormone replacement carries certain risks, especially as you become older. If you are thinking about using estrogen or estrogen with progestin treatments, discuss the benefits and risks with your health care provider. WHAT SHOULD I KNOW ABOUT HEART DISEASE AND STROKE? Heart disease, heart attack, and stroke become more likely as you age. This may be due, in part, to the hormonal changes that your body experiences during menopause. These can affect how your body processes dietary fats, triglycerides, and cholesterol. Heart attack and stroke are both medical emergencies. There are many things that you can do to help prevent heart disease and stroke:  Have your blood pressure checked at least every 1-2 years. High blood pressure causes heart disease and increases the risk of stroke.  If you are 67-70 years old, ask your health care provider if you should take  aspirin to prevent a heart attack or a stroke.  Do not use any tobacco products, including cigarettes, chewing tobacco, or electronic cigarettes. If you need help quitting, ask your health care provider.  It is important to eat a healthy diet and maintain a healthy weight.  Be sure to include plenty of vegetables, fruits, low-fat dairy products, and lean protein.  Avoid eating foods that are high in solid fats, added sugars, or salt (sodium).  Get regular exercise. This is one of the most important things that you can do for your health.  Try to exercise for at least 150 minutes each week. The type of exercise that you do should increase your heart rate and make you sweat. This is known as moderate-intensity exercise.  Try to do strengthening exercises at least twice each week. Do these in addition to the moderate-intensity exercise.  Know your numbers.Ask your health care provider to check your cholesterol and your blood glucose. Continue to have your blood tested as directed by your health care provider. WHAT SHOULD I KNOW ABOUT CANCER SCREENING? There are several types of cancer. Take the following steps to reduce your risk and to catch any cancer development as early as possible. Breast Cancer  Practice breast self-awareness.  This means understanding how your breasts normally appear and feel.  It also means doing regular breast self-exams. Let your health care provider know about any changes, no matter how small.  If you are 38 or older, have a clinician do a breast exam (clinical breast exam  or CBE) every year. Depending on your age, family history, and medical history, it may be recommended that you also have a yearly breast X-ray (mammogram).  If you have a family history of breast cancer, talk with your health care provider about genetic screening.  If you are at high risk for breast cancer, talk with your health care provider about having an MRI and a mammogram every  year.  Breast cancer (BRCA) gene test is recommended for women who have family members with BRCA-related cancers. Results of the assessment will determine the need for genetic counseling and BRCA1 and for BRCA2 testing. BRCA-related cancers include these types:  Breast. This occurs in males or females.  Ovarian.  Tubal. This may also be called fallopian tube cancer.  Cancer of the abdominal or pelvic lining (peritoneal cancer).  Prostate.  Pancreatic. Cervical, Uterine, and Ovarian Cancer Your health care provider may recommend that you be screened regularly for cancer of the pelvic organs. These include your ovaries, uterus, and vagina. This screening involves a pelvic exam, which includes checking for microscopic changes to the surface of your cervix (Pap test).  For women ages 21-65, health care providers may recommend a pelvic exam and a Pap test every three years. For women ages 41-65, they may recommend the Pap test and pelvic exam, combined with testing for human papilloma virus (HPV), every five years. Some types of HPV increase your risk of cervical cancer. Testing for HPV may also be done on women of any age who have unclear Pap test results.  Other health care providers may not recommend any screening for nonpregnant women who are considered low risk for pelvic cancer and have no symptoms. Ask your health care provider if a screening pelvic exam is right for you.  If you have had past treatment for cervical cancer or a condition that could lead to cancer, you need Pap tests and screening for cancer for at least 20 years after your treatment. If Pap tests have been discontinued for you, your risk factors (such as having a new sexual partner) need to be reassessed to determine if you should start having screenings again. Some women have medical problems that increase the chance of getting cervical cancer. In these cases, your health care provider may recommend that you have screening  and Pap tests more often.  If you have a family history of uterine cancer or ovarian cancer, talk with your health care provider about genetic screening.  If you have vaginal bleeding after reaching menopause, tell your health care provider.  There are currently no reliable tests available to screen for ovarian cancer. Lung Cancer Lung cancer screening is recommended for adults 39-35 years old who are at high risk for lung cancer because of a history of smoking. A yearly low-dose CT scan of the lungs is recommended if you:  Currently smoke.  Have a history of at least 30 pack-years of smoking and you currently smoke or have quit within the past 15 years. A pack-year is smoking an average of one pack of cigarettes per day for one year. Yearly screening should:  Continue until it has been 15 years since you quit.  Stop if you develop a health problem that would prevent you from having lung cancer treatment. Colorectal Cancer  This type of cancer can be detected and can often be prevented.  Routine colorectal cancer screening usually begins at age 3 and continues through age 69.  If you have risk factors for colon cancer,  your health care provider may recommend that you be screened at an earlier age.  If you have a family history of colorectal cancer, talk with your health care provider about genetic screening.  Your health care provider may also recommend using home test kits to check for hidden blood in your stool.  A small camera at the end of a tube can be used to examine your colon directly (sigmoidoscopy or colonoscopy). This is done to check for the earliest forms of colorectal cancer.  Direct examination of the colon should be repeated every 5-10 years until age 39. However, if early forms of precancerous polyps or small growths are found or if you have a family history or genetic risk for colorectal cancer, you may need to be screened more often. Skin Cancer  Check your skin  from head to toe regularly.  Monitor any moles. Be sure to tell your health care provider:  About any new moles or changes in moles, especially if there is a change in a mole's shape or color.  If you have a mole that is larger than the size of a pencil eraser.  If any of your family members has a history of skin cancer, especially at a young age, talk with your health care provider about genetic screening.  Always use sunscreen. Apply sunscreen liberally and repeatedly throughout the day.  Whenever you are outside, protect yourself by wearing long sleeves, pants, a wide-brimmed hat, and sunglasses. WHAT SHOULD I KNOW ABOUT OSTEOPOROSIS? Osteoporosis is a condition in which bone destruction happens more quickly than new bone creation. After menopause, you may be at an increased risk for osteoporosis. To help prevent osteoporosis or the bone fractures that can happen because of osteoporosis, the following is recommended:  If you are 41-42 years old, get at least 1,000 mg of calcium and at least 600 mg of vitamin D per day.  If you are older than age 58 but younger than age 13, get at least 1,200 mg of calcium and at least 600 mg of vitamin D per day.  If you are older than age 40, get at least 1,200 mg of calcium and at least 800 mg of vitamin D per day. Smoking and excessive alcohol intake increase the risk of osteoporosis. Eat foods that are rich in calcium and vitamin D, and do weight-bearing exercises several times each week as directed by your health care provider. WHAT SHOULD I KNOW ABOUT HOW MENOPAUSE AFFECTS [place]? Depression may occur at any age, but it is more common as you become older. Common symptoms of depression include:  Low or sad mood.  Changes in sleep patterns.  Changes in appetite or eating patterns.  Feeling an overall lack of motivation or enjoyment of activities that you previously enjoyed.  Frequent crying spells. Talk with your health care  provider if you think that you are experiencing depression. WHAT SHOULD I KNOW ABOUT IMMUNIZATIONS? It is important that you get and maintain your immunizations. These include:  Tetanus, diphtheria, and pertussis (Tdap) booster vaccine.  Influenza every year before the flu season begins.  Pneumonia vaccine.  Shingles vaccine. Your health care provider may also recommend other immunizations.   This information is not intended to replace advice given to you by your health care provider. Make sure you discuss any questions you have with your health care provider.   Document Released: 12/04/2005 Document Revised: 11/02/2014 Document Reviewed: 06/14/2014 Elsevier Interactive Patient Education 2016 [place]  Eating Plan DASH stands for "Dietary Approaches to Stop Hypertension." The DASH eating plan is a healthy eating plan that has been shown to reduce high blood pressure (hypertension). Additional health benefits may include reducing the risk of type 2 diabetes mellitus, heart disease, and stroke. The DASH eating plan may also help with weight loss. WHAT DO I NEED TO KNOW ABOUT THE DASH EATING PLAN? For the DASH eating plan, you will follow these general guidelines:  Choose foods with a percent daily value for sodium of less than 5% (as listed on the food label).  Use salt-free seasonings or herbs instead of table salt or sea salt.  Check with your health care provider or pharmacist before using salt substitutes.  Eat lower-sodium products, often labeled as "lower sodium" or "no salt added."  Eat fresh foods.  Eat more vegetables, fruits, and low-fat dairy products.  Choose whole grains. Look for the word "whole" as the first word in the ingredient list.  Choose fish and skinless chicken or [place] more often than red meat. Limit fish, poultry, and meat to 6 oz (170 g) each day.  Limit sweets, desserts, sugars, and sugary drinks.  Choose heart-healthy fats.  Limit  cheese to 1 oz (28 g) per day.  Eat more home-cooked food and less restaurant, buffet, and fast food.  Limit fried foods.  Cook foods using methods other than frying.  Limit canned vegetables. If you do use them, rinse them well to decrease the sodium.  When eating at a restaurant, ask that your food be prepared with less salt, or no salt if possible. WHAT FOODS CAN I EAT? Seek help from a dietitian for individual calorie needs. Grains Whole grain or whole wheat bread. Brown rice. Whole grain or whole wheat pasta. Quinoa, bulgur, and whole grain cereals. Low-sodium cereals. Corn or whole wheat flour tortillas. Whole grain cornbread. Whole grain crackers. Low-sodium crackers. Vegetables Fresh or frozen vegetables (raw, steamed, roasted, or grilled). Low-sodium or reduced-sodium tomato and vegetable juices. Low-sodium or reduced-sodium tomato sauce and paste. Low-sodium or reduced-sodium canned vegetables.  Fruits All fresh, canned (in natural juice), or frozen fruits. Meat and Other Protein Products Ground beef (85% or leaner), grass-fed beef, or beef trimmed of fat. Skinless chicken or [place]. Ground chicken or [place]. Pork trimmed of fat. All fish and seafood. Eggs. Dried beans, peas, or lentils. Unsalted nuts and seeds. Unsalted canned beans. Dairy Low-fat dairy products, such as skim or 1% milk, 2% or reduced-fat cheeses, low-fat ricotta or cottage cheese, or plain low-fat yogurt. Low-sodium or reduced-sodium cheeses. Fats and Oils Tub margarines without trans fats. Light or reduced-fat mayonnaise and salad dressings (reduced sodium). Avocado. Safflower, olive, or canola oils. Natural peanut or almond butter. Other Unsalted popcorn and pretzels. The items listed above may not be a complete list of recommended foods or beverages. Contact your dietitian for more options. WHAT FOODS ARE NOT RECOMMENDED? Grains White bread. White pasta. White rice. Refined cornbread. Bagels and  croissants. Crackers that contain trans fat. Vegetables Creamed or fried vegetables. Vegetables in a cheese sauce. Regular canned vegetables. Regular canned tomato sauce and paste. Regular tomato and vegetable juices. Fruits Dried fruits. Canned fruit in light or heavy syrup. Fruit juice. Meat and Other Protein Products Fatty cuts of meat. Ribs, chicken wings, bacon, sausage, bologna, salami, chitterlings, fatback, hot dogs, bratwurst, and packaged luncheon meats. Salted nuts and seeds. Canned beans with salt. Dairy Whole or 2% milk, cream, half-and-half, and cream cheese. Whole-fat or sweetened yogurt. Full-fat  cheeses or blue cheese. Nondairy creamers and whipped toppings. Processed cheese, cheese spreads, or cheese curds. Condiments Onion and garlic salt, seasoned salt, table salt, and sea salt. Canned and packaged gravies. Worcestershire sauce. Tartar sauce. Barbecue sauce. Teriyaki sauce. Soy sauce, including reduced sodium. Steak sauce. Fish sauce. Oyster sauce. Cocktail sauce. Horseradish. Ketchup and mustard. Meat flavorings and tenderizers. Bouillon cubes. Hot sauce. Tabasco sauce. Marinades. Taco seasonings. Relishes. Fats and Oils Butter, stick margarine, lard, shortening, ghee, and bacon fat. Coconut, palm kernel, or palm oils. Regular salad dressings. Other Pickles and olives. Salted popcorn and pretzels. The items listed above may not be a complete list of foods and beverages to avoid. Contact your dietitian for more information. WHERE CAN I FIND MORE INFORMATION? National Heart, Lung, and Blood Institute: travelstabloid.com   This information is not intended to replace advice given to you by your health care provider. Make sure you discuss any questions you have with your health care provider.   Document Released: 10/01/2011 Document Revised: 11/02/2014 Document Reviewed: 08/16/2013 Elsevier Interactive Patient Education 2016 Elsevier  Inc[employer] -  Cholesterol Cholesterol is a fat. Your body needs a small amount of cholesterol. Cholesterol may build up in your blood vessels. This increases your chance of having a heart attack or stroke. You cannot feel your cholesterol levels. The only way to know your cholesterol level is high is with a blood test. Keep your test results. Work with your doctor to keep your cholesterol at a good level. WHAT DO THE TEST RESULTS MEAN?  Total cholesterol is how much cholesterol is in your blood.  LDL is bad cholesterol. This is the type that can build up. You want LDL to be low.  HDL is good cholesterol. It cleans your blood vessels and carries LDL away. You want HDL to be high.  Triglycerides are fat that the body can burn for energy or store. WHAT ARE GOOD LEVELS OF CHOLESTEROL?  Total cholesterol below 200.  LDL below 100 for people at risk. Below 70 for those at very high risk.  HDL above 50 is good. Above 60 is best.  Triglycerides below 150. HOW CAN I LOWER MY CHOLESTEROL?  Diet. Follow your diet programs as told by your doctor.  Choose fish, white meat chicken, roasted [place], or baked [place]. Try not to eat red meat, fried foods, or processed meats such as sausage and lunch meats.  Eat lots of fresh fruits and vegetables.  Choose whole grains, beans, pasta, potatoes, and cereals.  Use only small amounts of olive, corn, or canola oils.  Try not to eat butter, mayonnaise, shortening, or palm kernel oils.  Try not to eat foods with trans fats.  Drink skim or nonfat milk. Eat low-fat or nonfat yogurt and cheeses. Try not to drink whole milk or cream. Try not to eat ice cream, egg yolks, and full-fat cheeses.  Healthy desserts include angel food cake, ginger snaps, animal crackers, hard candy, popsicles, and low-fat or nonfat frozen yogurt. Try not to eat pastries, cakes, pies, and cookies.  Exercise. Follow your exercise programs as told by your doctor.  Be more active.  You can try gardening, walking, or taking the stairs. Ask your doctor about how you can be more active.  Medicine. Take medicine as told by your doctor.   This information is not intended to replace advice given to you by your health care provider. Make sure you discuss any questions you have with your health care provider.   Document Released:  01/08/2009 Document Revised: 11/02/2014 Document Reviewed: 07/26/2013 Elsevier Interactive Patient Education 2016 Elsevier Inc. - Coronary Artery Disease, Female Coronary artery disease (CAD) is a process in which the blood vessels of the heart (coronary arteries) become narrow or blocked. The narrowing or blockage can lead to decreased blood flow to the heart muscle (angina). Symptoms known as angina can develop if the blood flow is reduced to the heart for a short period of time. Prolonged reduced blood flow can cause a heart attack (myocardial infarction, MI). CAD is a leading cause of death for women. More women die from CAD than from cancer, lung disease, and accidents combined. It is important for women to understand the risks, symptoms, and treatment options for CAD. CAUSES Atherosclerosis is the cause of CAD. Atherosclerosis is the buildup of fat and cholesterol (plaque) on the inside of the arteries. Over time, the plaque may narrow or block the artery, and this will lessen blood flow to the heart. Plaque can also become weak and break off within a coronary artery to form a clot and cause a sudden blockage. RISK FACTORS Many risk factors increase your chances of getting CAD, including:  High cholesterol levels.  High blood pressure (hypertension).  Tobacco use.  Diabetes.  Age. Women over age 38 are at a greater risk of CAD.  Menopause.  All postmenopausal women are at greater risk of CAD.  Women who have experienced menopause between the ages of 62-45 (early menopause) are at a higher risk of CAD.  Women who have experienced  menopause before age 5 (premature menopause) are at an extremely high risk of CAD.  Family history of CAD.  Obesity.  Lack of exercise.  A diet high in saturated fats. SYMPTOMS  Many people do not experience any symptoms during the early stages of CAD. As the condition progresses, symptoms may include:  Chest pain.  The pain can be described as crushing or squeezing, or a tightness, pressure, fullness, or heaviness in the chest.  The pain can last more than a few minutes or can stop and recur.  Pain in the arms, neck, jaw, or back.  Unexplained heartburn or indigestion.  Shortness of breath.  Nausea.  Sudden cold sweats.  Sudden light-headedness. Many women have chest discomfort and the other symptoms. However, women often have different (atypical) symptoms, such as:  Fatigue.  Unexplained feelings of nervousness or anxiety.  Unexplained weakness.  Dizziness or fainting. Sometimes, women may not have any symptoms of CAD. DIAGNOSIS  Tests to diagnose CAD may include:  ECG (electrocardiogram).  Exercise stress test. This looks for signs of blockage when the heart is being exercised.  Pharmacologic stress test. This test looks for signs of blockage when the heart is being stressed with a medicine.  Blood tests.  Coronary angiogram. This is a procedure to look at the coronary arteries to see if there is any blockage. TREATMENT The treatment of CAD may include the following:  Healthy behavioral changes to reduce or control risk factors.  Medicine.  Coronary stenting.A stent helps to keep an artery open.  Coronary angioplasty. This procedure widens a narrowed or blocked artery.  Coronary arterybypass surgery. This will allow your blood to pass the blockage (bypass) to reach your heart. HOME CARE INSTRUCTIONS  Take medicines only as directed by your health care provider.  Do not take the following medicines unless your health care provider  approves:  Nonsteroidal anti-inflammatory drugs (NSAIDs), such as ibuprofen, naproxen, or celecoxib.  Vitamin supplements that contain  vitamin A, vitamin E, or both.  Hormone replacement therapy that contains estrogen with or without progestin.  Manage other health conditions such as hypertension and diabetes as directed by your health care provider.  Follow a heart-healthy diet. A dietitian can help to educate you about healthy food options and changes.  Use healthy cooking methods such as roasting, grilling, broiling, baking, poaching, steaming, or stir-frying. Talk to a dietitian to learn more about healthy cooking methods.  Follow an exercise program approved by your health care provider.  Maintain a healthy weight. Lose weight as approved by your health care provider.  Plan rest periods when fatigued.  Learn to manage stress.  Do not use any tobacco products, including cigarettes, chewing tobacco, or electronic cigarettes. If you need help quitting, ask your health care provider.  If you drink alcohol, and your health care provider approves, limit your alcohol intake to no more than 1 drink per day. One drink equals 12 ounces of beer, 5 ounces of wine, or 1 ounces of hard liquor.  Stop illegal drug use.  Your health care provider may ask you to monitor your blood pressure. A blood pressure reading consists of a higher number over a lower number, such as 110 over 72, which is written as 110/72. Ideally, your blood pressure should be:  Below 140/90 if you have no other medical conditions.  Below 130/80 if you have diabetes or kidney disease.  Keep all follow-up visits as directed by your health care provider. This is important. SEEK IMMEDIATE MEDICAL CARE IF:  You have pain in your chest, neck, arm, jaw, stomach, or back that lasts more than a few minutes, is recurring, or is unrelieved by taking medicine under your tongue (sublingualnitroglycerin).  You have profuse  sweating without cause.  You have unexplained:  Heartburn or indigestion.  Shortness of breath or difficulty breathing.  Nausea or vomiting.  Fatigue.  Feelings of nervousness or anxiety.  Weakness.  Diarrhea.  You have sudden light-headedness or dizziness.  You faint. These symptoms may represent a serious problem that is an emergency. Do not wait to see if the symptoms will go away. Get medical help right away. Call your local emergency services (911 in the [place] Do not drive yourself to the hospital.   This information is not intended to replace advice given to you by your health care provider. Make sure you discuss any questions you have with your health care provider.   Document Released: 01/04/2012 Document Revised: 11/02/2014 Document Reviewed: 02/13/2014 Elsevier Interactive Patient Education [employer].

## 2016-07-29 ENCOUNTER — Other Ambulatory Visit: Payer: Self-pay | Admitting: Internal Medicine

## 2016-07-29 DIAGNOSIS — I214 Non-ST elevation (NSTEMI) myocardial infarction: Secondary | ICD-10-CM

## 2016-07-29 LAB — BASIC METABOLIC PANEL WITH GFR
BUN: 11 mg/dL (ref 7–25)
CHLORIDE: 106 mmol/L (ref 98–110)
CO2: 27 mmol/L (ref 20–31)
CREATININE: 0.88 mg/dL (ref 0.50–1.05)
Calcium: 9.8 mg/dL (ref 8.6–10.4)
GFR, Est African American: 83 mL/min (ref >=60)
GFR, Est Non African American: 72 mL/min (ref >=60)
Glucose, Bld: 90 mg/dL (ref 65–99)
Potassium: 4.1 mmol/L (ref 3.5–5.3)
Sodium: 141 mmol/L (ref 135–146)

## 2016-07-29 LAB — CK: Total CK: 185 U/L — ABNORMAL HIGH (ref 7–177)

## 2016-07-29 LAB — PREALBUMIN: Prealbumin: 32 mg/dL (ref 17–34)

## 2016-08-28 ENCOUNTER — Encounter: Payer: Self-pay | Admitting: Internal Medicine

## 2017-01-08 ENCOUNTER — Telehealth: Payer: Self-pay | Admitting: Internal Medicine

## 2017-01-08 NOTE — Telephone Encounter (Signed)
Pt calling in for results for some tests that were done for her "last year". Please f/u with pt. Thank you.

## 2017-01-11 NOTE — Telephone Encounter (Signed)
My chart note was sent to patient last year - guess she did not see that email.   Please call her w/ following results: thanks. Written by Maren Reamer, MD on 07/29/2016 1:06 PM  Good afternoon. Your labs so no signs of malnutrition. Perfect weight for your height. Kidney function also normal take care.

## 2017-01-12 NOTE — Telephone Encounter (Signed)
Pt is aware of results. 

## 2017-01-19 ENCOUNTER — Ambulatory Visit: Payer: 59 | Admitting: Internal Medicine

## 2017-03-11 ENCOUNTER — Encounter: Payer: Self-pay | Admitting: Internal Medicine

## 2017-04-06 ENCOUNTER — Ambulatory Visit: Payer: 59 | Admitting: Internal Medicine

## 2017-07-15 ENCOUNTER — Ambulatory Visit: Payer: 59 | Admitting: Family Medicine

## 2017-07-22 ENCOUNTER — Other Ambulatory Visit: Payer: Self-pay | Admitting: Nurse Practitioner

## 2017-07-22 DIAGNOSIS — Z1231 Encounter for screening mammogram for malignant neoplasm of breast: Secondary | ICD-10-CM

## 2018-01-03 ENCOUNTER — Other Ambulatory Visit: Payer: Self-pay | Admitting: Nurse Practitioner

## 2018-01-03 DIAGNOSIS — Z1231 Encounter for screening mammogram for malignant neoplasm of breast: Secondary | ICD-10-CM

## 2018-01-20 ENCOUNTER — Inpatient Hospital Stay: Admission: RE | Admit: 2018-01-20 | Payer: Self-pay | Source: Ambulatory Visit

## 2018-07-19 ENCOUNTER — Other Ambulatory Visit: Payer: Self-pay | Admitting: Nurse Practitioner

## 2018-07-19 DIAGNOSIS — Z1231 Encounter for screening mammogram for malignant neoplasm of breast: Secondary | ICD-10-CM

## 2018-08-03 ENCOUNTER — Ambulatory Visit
Admission: RE | Admit: 2018-08-03 | Discharge: 2018-08-03 | Disposition: A | Discharge status: Home / Self Care | Payer: 59 | Source: Ambulatory Visit | Attending: Nurse Practitioner | Admitting: Nurse Practitioner

## 2018-08-03 DIAGNOSIS — Z1231 Encounter for screening mammogram for malignant neoplasm of breast: Secondary | ICD-10-CM | POA: Diagnosis present

## 2018-09-06 ENCOUNTER — Encounter (INDEPENDENT_AMBULATORY_CARE_PROVIDER_SITE_OTHER): Payer: 59 | Admitting: Nurse Practitioner

## 2018-09-09 ENCOUNTER — Ambulatory Visit (INDEPENDENT_AMBULATORY_CARE_PROVIDER_SITE_OTHER): Payer: 59 | Admitting: Nurse Practitioner

## 2018-09-09 ENCOUNTER — Encounter (INDEPENDENT_AMBULATORY_CARE_PROVIDER_SITE_OTHER): Payer: Self-pay | Admitting: Nurse Practitioner

## 2018-09-09 VITALS — BP 158/81 | HR 93 | Resp 17 | Ht 62.0 in | Wt 146.8 lb

## 2018-09-09 DIAGNOSIS — E785 Hyperlipidemia, unspecified: Secondary | ICD-10-CM | POA: Diagnosis not present

## 2018-09-09 DIAGNOSIS — I83893 Varicose veins of bilateral lower extremities with other complications: Secondary | ICD-10-CM | POA: Diagnosis not present

## 2018-09-09 DIAGNOSIS — I10 Essential (primary) hypertension: Secondary | ICD-10-CM | POA: Diagnosis not present

## 2018-09-13 ENCOUNTER — Encounter (INDEPENDENT_AMBULATORY_CARE_PROVIDER_SITE_OTHER): Payer: Self-pay | Admitting: Nurse Practitioner

## 2018-09-13 DIAGNOSIS — I83893 Varicose veins of bilateral lower extremities with other complications: Secondary | ICD-10-CM | POA: Insufficient documentation

## 2018-09-13 NOTE — Progress Notes (Signed)
Subjective:    Patient ID: Chelsey Wilson, female    DOB: 09/12/1957, 52 y.o.   MRN: [id number] Chief Complaint  Patient presents with  . New Patient (Initial Visit)    ref [facility] for varicose veins    HPI  Chelsey Wilson is a 52 y.o. female that is seen for evaluation of symptomatic varicose veins. The patient relates burning and stinging which worsened steadily throughout the course of the day, particularly with standing. The patient also notes an aching and throbbing pain over the varicosities, particularly with prolonged dependent positions. The symptoms are significantly improved with elevation.  The patient also notes that during hot weather the symptoms are greatly intensified. The patient states the pain from the varicose veins interferes with work, daily exercise, shopping and household maintenance. At this point, the symptoms are persistent and severe enough that they're having a negative impact on lifestyle and are interfering with daily activities.  There is no history of DVT, PE or superficial thrombophlebitis. There is no history of ulceration or hemorrhage. The patient denies a significant family history of varicose veins.   The patient has not worn graduated compression in the past. At the present time the patient has not been using over-the-counter analgesics. There is no history of prior surgical intervention or sclerotherapy.    Past Medical History:  Diagnosis Date  . Essential hypertension   . HLD (hyperlipidemia)     Past Surgical History:  Procedure Laterality Date  . CARDIAC CATHETERIZATION Right 05/27/2016   Procedure: Left Heart Cath and Coronary Angiography;  Surgeon: Dionisio David, MD;  Location: [facility];  Service: Cardiovascular;  Laterality: Right;  . CARDIAC CATHETERIZATION N/A 05/27/2016   Procedure: Coronary Stent Intervention;  Surgeon: Yolonda Kida, MD;  Location: [facility];  Service: Cardiovascular;  Laterality: N/A;     Social History   Socioeconomic History  . Marital status: Single    Spouse name: Not on file  . Number of children: Not on file  . Years of education: Not on file  . Highest education level: Not on file  Occupational History  . Not on file  Social Needs  . Financial resource strain: Not on file  . Food insecurity:    Worry: Not on file    Inability: Not on file  . Transportation needs:    Medical: Not on file    Non-medical: Not on file  Tobacco Use  . Smoking status: Never Smoker  . Smokeless tobacco: Never Used  Substance and Sexual Activity  . Alcohol use: No  . Drug use: No  . Sexual activity: Never  Lifestyle  . Physical activity:    Days per week: Not on file    Minutes per session: Not on file  . Stress: Not on file  Relationships  . Social connections:    Talks on phone: Not on file    Gets together: Not on file    Attends religious service: Not on file    Active member of club or organization: Not on file    Attends meetings of clubs or organizations: Not on file    Relationship status: Not on file  . Intimate partner violence:    Fear of current or ex partner: Not on file    Emotionally abused: Not on file    Physically abused: Not on file    Forced sexual activity: Not on file  Other Topics Concern  . Not on file  Social History  Narrative  . Not on file    Family History  Problem Relation Age of Onset  . Other Neg Hx     No Known Allergies   Review of Systems   Review of Systems: Negative Unless Checked Constitutional: [] Weight loss  [] Fever  [] Chills Cardiac: [] Chest pain   []  Atrial Fibrillation  [] Palpitations   [] Shortness of breath when laying flat   [] Shortness of breath with exertion. Vascular:  [] Pain in legs with walking   [] Pain in legs with standing  [] History of DVT   [] Phlebitis   [x] Swelling in legs   [x] Varicose veins   [] Non-healing ulcers Pulmonary:   [] Uses home oxygen   [] Productive cough   [] Hemoptysis   [] Wheeze   [] COPD   [] Asthma Neurologic:  [] Dizziness   [] Seizures   [] History of stroke   [] History of TIA  [] Aphasia   [] Vissual changes   [] Weakness or numbness in arm   [] Weakness or numbness in leg Musculoskeletal:   [] Joint swelling   [] Joint pain   [] Low back pain  []  History of Knee Replacement Hematologic:  [] Easy bruising  [] Easy bleeding   [] Hypercoagulable state   [] Anemic Gastrointestinal:  [] Diarrhea   [] Vomiting  [] Gastroesophageal reflux/heartburn   [] Difficulty swallowing. Genitourinary:  [] Chronic kidney disease   [] Difficult urination  [] Anuric   [] Blood in urine Skin:  [] Rashes   [] Ulcers  Psychological:  [] History of anxiety   []  History of major depression  []  Memory Difficulties     Objective:   Physical Exam  BP (!) 158/81 (BP Location: Right Arm)   Pulse 93   Resp 17   Ht 5\' 2"  (1.575 m)   Wt 146 lb 12.8 oz (66.6 kg)   BMI 26.85 kg/m   Gen: WD/WN, NAD Head: [place]/AT, No temporalis wasting.  Ear/Nose/Throat: Hearing grossly intact, nares w/o erythema or drainage Eyes: PER, EOMI, sclera nonicteric.  Neck: Supple, no masses.  No JVD.  Pulmonary:  Good air movement, no use of accessory muscles.  Cardiac: RRR Vascular: multiple scattered spider veins, large varicosity 1-2 mm on right lower extremity Vessel Right Left  Radial Palpable Palpable  Dorsalis Pedis Palpable Palpable  Posterior Tibial Palpable Palpable   Gastrointestinal: soft, non-distended. No guarding/no peritoneal signs.  Musculoskeletal: M/S 5/5 throughout.  No deformity or atrophy.  Neurologic: Pain and light touch intact in extremities.  Symmetrical.  Speech is fluent. Motor exam as listed above. Psychiatric: Judgment intact, Mood & affect appropriate for pt's clinical situation. Dermatologic: No Venous rashes. No Ulcers Noted.  No changes consistent with cellulitis. Lymph : No Cervical lymphadenopathy, no lichenification or skin changes of chronic lymphedema.      Assessment & Plan:   1. Varicose  veins of bilateral lower extremities with other complications  Recommend:  The patient has large symptomatic varicose veins that are painful and associated with swelling.  I have had a long discussion with the patient regarding  varicose veins and why they cause symptoms.  Patient will begin wearing graduated compression stockings class 1 on a daily basis, beginning first thing in the morning and removing them in the evening. The patient is instructed specifically not to sleep in the stockings.    The patient  will also begin using over-the-counter analgesics such as Motrin 600 mg po TID to help control the symptoms.    In addition, behavioral modification including elevation during the day will be initiated.    Pending the results of these changes the  patient will be reevaluated in  three months.   An  ultrasound of the venous system will be obtained.   Further plans will be based on the ultrasound results and whether conservative therapies are successful at eliminating the pain and swelling.  - VAS [place] LOWER EXTREMITY VENOUS REFLUX; Future  2. Dyslipidemia Continue statin as ordered and reviewed, no changes at this time   3. Essential hypertension, benign Continue antihypertensive medications as already ordered, these medications have been reviewed and there are no changes at this time.    Current Outpatient Medications on File Prior to Visit  Medication Sig Dispense Refill  . amitriptyline (ELAVIL) 50 MG tablet     . aspirin EC 81 MG EC tablet Take 1 tablet (81 mg total) by mouth daily. 30 tablet 3  . PRALUENT 150 MG/ML SOAJ     . atorvastatin (LIPITOR) 80 MG tablet Take 1 tablet (80 mg total) by mouth daily at 6 PM. (Patient not taking: Reported on 09/09/2018) 30 tablet 3  . diclofenac sodium (VOLTAREN) 1 % GEL Apply 2 g topically 4 (four) times daily. (Patient not taking: Reported on 09/09/2018) 100 g 2  . lisinopril (PRINIVIL) 10 MG tablet Take 1 tablet (10 mg total) by mouth  daily. (Patient not taking: Reported on 09/09/2018) 30 tablet 2  . metoprolol tartrate (LOPRESSOR) 25 MG tablet Take 1 tablet (25 mg total) by mouth 2 (two) times daily. (Patient not taking: Reported on 09/09/2018) 60 tablet 2  . nitroGLYCERIN (NITROSTAT) 0.4 MG SL tablet Place 1 tablet (0.4 mg total) under the tongue every 5 (five) minutes as needed for chest pain. (Patient not taking: Reported on 07/28/2016) 30 tablet 3  . ticagrelor (BRILINTA) 90 MG TABS tablet Take 1 tablet (90 mg total) by mouth 2 (two) times daily. (Patient not taking: Reported on 09/09/2018) 60 tablet 3   No current facility-administered medications on file prior to visit.     There are no Patient Instructions on file for this visit. No follow-ups on file.   Kris Hartmann, NP  This note was completed with Sales executive.  Any errors are purely unintentional.

## 2018-12-06 ENCOUNTER — Ambulatory Visit (INDEPENDENT_AMBULATORY_CARE_PROVIDER_SITE_OTHER): Payer: 59

## 2018-12-06 ENCOUNTER — Encounter (INDEPENDENT_AMBULATORY_CARE_PROVIDER_SITE_OTHER): Payer: Self-pay | Admitting: Nurse Practitioner

## 2018-12-06 ENCOUNTER — Other Ambulatory Visit: Payer: Self-pay

## 2018-12-06 ENCOUNTER — Ambulatory Visit (INDEPENDENT_AMBULATORY_CARE_PROVIDER_SITE_OTHER): Payer: 59 | Admitting: Nurse Practitioner

## 2018-12-06 VITALS — BP 158/84 | HR 89 | Resp 10 | Ht 60.0 in | Wt 147.0 lb

## 2018-12-06 DIAGNOSIS — I83893 Varicose veins of bilateral lower extremities with other complications: Secondary | ICD-10-CM | POA: Diagnosis not present

## 2018-12-06 DIAGNOSIS — E785 Hyperlipidemia, unspecified: Secondary | ICD-10-CM

## 2018-12-06 DIAGNOSIS — I10 Essential (primary) hypertension: Secondary | ICD-10-CM | POA: Diagnosis not present

## 2018-12-06 NOTE — Progress Notes (Signed)
SUBJECTIVE:   Patient ID: Chelsey Wilson, female    DOB: 09/12/1957, 53 y.o.   MRN: [id number] Chief Complaint  Patient presents with  . Follow-up    HPI  Chelsey Wilson is a 53 y.o. female The patient returns for followup evaluation 3 months after the initial visit. The patient continues to have pain in the lower extremities with dependency. The pain is lessened with elevation. Graduated compression stockings, Class I (20-30 mmHg), have been worn but the stockings do not eliminate the leg pain. Over-the-counter analgesics do not improve the symptoms. The degree of discomfort continues to interfere with daily activities. The patient notes the pain in the legs is causing problems with daily exercise, at the workplace and even with household activities and maintenance such as standing in the kitchen preparing meals and doing dishes.   Venous ultrasound shows normal deep venous system, no evidence of acute or chronic DVT.  Superficial reflux is present in the right great saphenous vein and small saphenous vein.   Past Medical History:  Diagnosis Date  . Essential hypertension   . HLD (hyperlipidemia)     Past Surgical History:  Procedure Laterality Date  . CARDIAC CATHETERIZATION Right 05/27/2016   Procedure: Left Heart Cath and Coronary Angiography;  Surgeon: Dionisio David, MD;  Location: [facility];  Service: Cardiovascular;  Laterality: Right;  . CARDIAC CATHETERIZATION N/A 05/27/2016   Procedure: Coronary Stent Intervention;  Surgeon: Yolonda Kida, MD;  Location: [facility];  Service: Cardiovascular;  Laterality: N/A;    Social History   Socioeconomic History  . Marital status: Single    Spouse name: Not on file  . Number of children: Not on file  . Years of education: Not on file  . Highest education level: Not on file  Occupational History  . Not on file  Social Needs  . Financial resource strain: Not on file  . Food insecurity:    Worry: Not on file      Inability: Not on file  . Transportation needs:    Medical: Not on file    Non-medical: Not on file  Tobacco Use  . Smoking status: Never Smoker  . Smokeless tobacco: Never Used  Substance and Sexual Activity  . Alcohol use: No  . Drug use: No  . Sexual activity: Never  Lifestyle  . Physical activity:    Days per week: Not on file    Minutes per session: Not on file  . Stress: Not on file  Relationships  . Social connections:    Talks on phone: Not on file    Gets together: Not on file    Attends religious service: Not on file    Active member of club or organization: Not on file    Attends meetings of clubs or organizations: Not on file    Relationship status: Not on file  . Intimate partner violence:    Fear of current or ex partner: Not on file    Emotionally abused: Not on file    Physically abused: Not on file    Forced sexual activity: Not on file  Other Topics Concern  . Not on file  Social History Narrative  . Not on file    Family History  Problem Relation Age of Onset  . Other Neg Hx     No Known Allergies   Review of Systems   Review of Systems: Negative Unless Checked Constitutional: [] Weight loss  [] Fever  [] Chills Cardiac: []   Chest pain   []  Atrial Fibrillation  [] Palpitations   [] Shortness of breath when laying flat   [] Shortness of breath with exertion. [] Shortness of breath at rest Vascular:  [] Pain in legs with walking   [x] Pain in legs with standing [] Pain in legs when laying flat   [] Claudication    [] Pain in feet when laying flat    [] History of DVT   [] Phlebitis   [x] Swelling in legs   [x] Varicose veins   [] Non-healing ulcers Pulmonary:   [] Uses home oxygen   [] Productive cough   [] Hemoptysis   [] Wheeze  [] COPD   [] Asthma Neurologic:  [] Dizziness   [] Seizures  [] Blackouts [] History of stroke   [] History of TIA  [] Aphasia   [] Temporary Blindness   [] Weakness or numbness in arm   [] Weakness or numbness in leg Musculoskeletal:   [] Joint  swelling   [] Joint pain   [] Low back pain  []  History of Knee Replacement [] Arthritis [] back Surgeries  []  Spinal Stenosis    Hematologic:  [] Easy bruising  [] Easy bleeding   [] Hypercoagulable state   [] Anemic Gastrointestinal:  [] Diarrhea   [] Vomiting  [] Gastroesophageal reflux/heartburn   [] Difficulty swallowing. [] Abdominal pain Genitourinary:  [] Chronic kidney disease   [] Difficult urination  [] Anuric   [] Blood in urine [] Frequent urination  [] Burning with urination   [] Hematuria Skin:  [] Rashes   [] Ulcers [] Wounds Psychological:  [] History of anxiety   []  History of major depression  []  Memory Difficulties      OBJECTIVE:    Physical Exam  BP (!) 158/84 (BP Location: Left Arm, Patient Position: Sitting, Cuff Size: Small)   Pulse 89   Resp 10   Ht 5' (1.524 m)   Wt 147 lb (66.7 kg)   BMI 28.71 kg/m   Gen: WD/WN, NAD Head: [place]/AT, No temporalis wasting.  Ear/Nose/Throat: Hearing grossly intact, nares w/o erythema or drainage Eyes: PER, EOMI, sclera nonicteric.  Neck: Supple, no masses.  No JVD.  Pulmonary:  Good air movement, no use of accessory muscles.  Cardiac: RRR Vascular: prominent varicosity on right lower extremity.  Vessel Right Left  Radial Palpable Palpable  Dorsalis Pedis Palpable Palpable  Posterior Tibial Palpable Palpable   Gastrointestinal: soft, non-distended. No guarding/no peritoneal signs.  Musculoskeletal: M/S 5/5 throughout.  No deformity or atrophy.  Neurologic: Pain and light touch intact in extremities.  Symmetrical.  Speech is fluent. Motor exam as listed above. Psychiatric: Judgment intact, Mood & affect appropriate for pt's clinical situation. Dermatologic: No Venous rashes. No Ulcers Noted.  No changes consistent with cellulitis. Lymph : No Cervical lymphadenopathy, no lichenification or skin changes of chronic lymphedema.       ASSESSMENT AND PLAN:  1. Varicose veins of bilateral lower extremities with other complications Recommend  I  have reviewed my previous  discussion with the patient regarding  varicose veins and why they cause symptoms. Patient will continue  wearing graduated compression stockings class 1 on a daily basis, beginning first thing in the morning and removing them in the evening.    In addition, behavioral modification including elevation during the day was again discussed and this will continue.  The patient has utilized over the counter pain medications and has been exercising.  However, at this time conservative therapy has not alleviated the patient's symptoms of leg pain and swelling  Recommend: laser ablation of the right great and small saphenous veins to eliminate the symptoms of pain and swelling of the lower extremities caused by the severe superficial venous reflux disease.  2. Dyslipidemia Continue statin as ordered and reviewed, no changes at this time   3. Essential hypertension, benign Continue antihypertensive medications as already ordered, these medications have been reviewed and there are no changes at this time.    Current Outpatient Medications on File Prior to Visit  Medication Sig Dispense Refill  . amitriptyline (ELAVIL) 50 MG tablet     . aspirin EC 81 MG EC tablet Take 1 tablet (81 mg total) by mouth daily. 30 tablet 3  . atorvastatin (LIPITOR) 80 MG tablet Take 1 tablet (80 mg total) by mouth daily at 6 PM. (Patient not taking: Reported on 09/09/2018) 30 tablet 3  . diclofenac sodium (VOLTAREN) 1 % GEL Apply 2 g topically 4 (four) times daily. (Patient not taking: Reported on 09/09/2018) 100 g 2  . lisinopril (PRINIVIL) 10 MG tablet Take 1 tablet (10 mg total) by mouth daily. (Patient not taking: Reported on 09/09/2018) 30 tablet 2  . metoprolol tartrate (LOPRESSOR) 25 MG tablet Take 1 tablet (25 mg total) by mouth 2 (two) times daily. (Patient not taking: Reported on 09/09/2018) 60 tablet 2  . nitroGLYCERIN (NITROSTAT) 0.4 MG SL tablet Place 1 tablet (0.4 mg total) under  the tongue every 5 (five) minutes as needed for chest pain. (Patient not taking: Reported on 07/28/2016) 30 tablet 3  . PRALUENT 150 MG/ML SOAJ     . ticagrelor (BRILINTA) 90 MG TABS tablet Take 1 tablet (90 mg total) by mouth 2 (two) times daily. (Patient not taking: Reported on 09/09/2018) 60 tablet 3   No current facility-administered medications on file prior to visit.     There are no Patient Instructions on file for this visit. No follow-ups on file.   Kris Hartmann, NP  This note was completed with Sales executive.  Any errors are purely unintentional.

## 2019-01-27 ENCOUNTER — Other Ambulatory Visit (INDEPENDENT_AMBULATORY_CARE_PROVIDER_SITE_OTHER): Payer: 59 | Admitting: Vascular Surgery

## 2019-01-30 ENCOUNTER — Encounter (INDEPENDENT_AMBULATORY_CARE_PROVIDER_SITE_OTHER): Payer: 59

## 2019-03-03 ENCOUNTER — Other Ambulatory Visit: Payer: Self-pay

## 2019-03-03 ENCOUNTER — Encounter (INDEPENDENT_AMBULATORY_CARE_PROVIDER_SITE_OTHER): Payer: Self-pay | Admitting: Nurse Practitioner

## 2019-03-03 ENCOUNTER — Ambulatory Visit (INDEPENDENT_AMBULATORY_CARE_PROVIDER_SITE_OTHER): Payer: 59 | Admitting: Nurse Practitioner

## 2019-03-03 VITALS — BP 170/107 | HR 82 | Resp 10 | Ht 60.0 in | Wt 146.0 lb

## 2019-03-03 DIAGNOSIS — E785 Hyperlipidemia, unspecified: Secondary | ICD-10-CM

## 2019-03-03 DIAGNOSIS — I83893 Varicose veins of bilateral lower extremities with other complications: Secondary | ICD-10-CM | POA: Diagnosis not present

## 2019-03-03 DIAGNOSIS — I10 Essential (primary) hypertension: Secondary | ICD-10-CM

## 2019-03-09 ENCOUNTER — Encounter (INDEPENDENT_AMBULATORY_CARE_PROVIDER_SITE_OTHER): Payer: Self-pay | Admitting: Nurse Practitioner

## 2019-03-09 NOTE — Progress Notes (Signed)
SUBJECTIVE:  Patient ID: Chelsey Wilson, female    DOB: 09/12/1957, 53 y.o.   MRN: [id number] Chief Complaint  Patient presents with   Follow-up    HPI  Chelsey Wilson is a 53 y.o. female presented today due to concerns and questions over her upcoming endovenous laser procedure.  The patient had additional questions that were fully answered.  The patient states that she wishes to continue with the procedure however she will contact us once she has time away from work as COVID has made it very busy.  She denies fevers, chills, nausea, vomiting or diarrhea.  Past Medical History:  Diagnosis Date   Essential hypertension    HLD (hyperlipidemia)     Past Surgical History:  Procedure Laterality Date   CARDIAC CATHETERIZATION Right 05/27/2016   Procedure: Left Heart Cath and Coronary Angiography;  Surgeon: Dionisio David, MD;  Location: [facility];  Service: Cardiovascular;  Laterality: Right;   CARDIAC CATHETERIZATION N/A 05/27/2016   Procedure: Coronary Stent Intervention;  Surgeon: Yolonda Kida, MD;  Location: [facility];  Service: Cardiovascular;  Laterality: N/A;    Social History   Socioeconomic History   Marital status: Single    Spouse name: Not on file   Number of children: Not on file   Years of education: Not on file   Highest education level: Not on file  Occupational History   Not on file  Social Needs   Financial resource strain: Not on file   Food insecurity:    Worry: Not on file    Inability: Not on file   Transportation needs:    Medical: Not on file    Non-medical: Not on file  Tobacco Use   Smoking status: Never Smoker   Smokeless tobacco: Never Used  Substance and Sexual Activity   Alcohol use: No   Drug use: No   Sexual activity: Never  Lifestyle   Physical activity:    Days per week: Not on file    Minutes per session: Not on file   Stress: Not on file  Relationships   Social connections:    Talks on  phone: Not on file    Gets together: Not on file    Attends religious service: Not on file    Active member of club or organization: Not on file    Attends meetings of clubs or organizations: Not on file    Relationship status: Not on file   Intimate partner violence:    Fear of current or ex partner: Not on file    Emotionally abused: Not on file    Physically abused: Not on file    Forced sexual activity: Not on file  Other Topics Concern   Not on file  Social History Narrative   Not on file    Family History  Problem Relation Age of Onset   Other Neg Hx     No Known Allergies   Review of Systems   Review of Systems: Negative Unless Checked Constitutional: [] Weight loss  [] Fever  [] Chills Cardiac: [] Chest pain   []  Atrial Fibrillation  [] Palpitations   [] Shortness of breath when laying flat   [] Shortness of breath with exertion. [] Shortness of breath at rest Vascular:  [] Pain in legs with walking   [] Pain in legs with standing [] Pain in legs when laying flat   [] Claudication    [] Pain in feet when laying flat    [] History of DVT   [] Phlebitis   [  x]Swelling in legs   [x] Varicose veins   [] Non-healing ulcers Pulmonary:   [] Uses home oxygen   [] Productive cough   [] Hemoptysis   [] Wheeze  [] COPD   [] Asthma Neurologic:  [] Dizziness   [] Seizures  [] Blackouts [] History of stroke   [] History of TIA  [] Aphasia   [] Temporary Blindness   [] Weakness or numbness in arm   [] Weakness or numbness in leg Musculoskeletal:   [] Joint swelling   [] Joint pain   [] Low back pain  []  History of Knee Replacement [] Arthritis [] back Surgeries  []  Spinal Stenosis    Hematologic:  [] Easy bruising  [] Easy bleeding   [] Hypercoagulable state   [] Anemic Gastrointestinal:  [] Diarrhea   [] Vomiting  [] Gastroesophageal reflux/heartburn   [] Difficulty swallowing. [] Abdominal pain Genitourinary:  [] Chronic kidney disease   [] Difficult urination  [] Anuric   [] Blood in urine [] Frequent urination  [] Burning with  urination   [] Hematuria Skin:  [] Rashes   [] Ulcers [] Wounds Psychological:  [] History of anxiety   []  History of major depression  []  Memory Difficulties      OBJECTIVE:   Physical Exam  BP (!) 170/107 (BP Location: Left Arm, Patient Position: Sitting, Cuff Size: Small)    Pulse 82    Resp 10    Ht 5' (1.524 m)    Wt 146 lb (66.2 kg)    BMI 28.51 kg/m   Gen: WD/WN, NAD Head: [place]/AT, No temporalis wasting.  Ear/Nose/Throat: Hearing grossly intact, nares w/o erythema or drainage Eyes: PER, EOMI, sclera nonicteric.  Neck: Supple, no masses.  No JVD.  Pulmonary:  Good air movement, no use of accessory muscles.  Cardiac: RRR Vascular:  Vessel Right Left  Radial Palpable Palpable  Dorsalis Pedis Palpable Palpable  Posterior Tibial Palpable Palpable   Gastrointestinal: soft, non-distended. No guarding/no peritoneal signs.  Musculoskeletal: M/S 5/5 throughout.  No deformity or atrophy.  Neurologic: Pain and light touch intact in extremities.  Symmetrical.  Speech is fluent. Motor exam as listed above. Psychiatric: Judgment intact, Mood & affect appropriate for pt's clinical situation. Dermatologic: No Venous rashes. No Ulcers Noted.  No changes consistent with cellulitis. Lymph : No Cervical lymphadenopathy, no lichenification or skin changes of chronic lymphedema.       ASSESSMENT AND PLAN:  1. Varicose veins of bilateral lower extremities with other complications Patient agrees to proceed with endovenous laser procedure of her right lower extremity.  All questions were answered and risks, benefits and alternatives were reviewed.  The patient will contact us when she is ready to proceed.  Advised the patient that typically there was about 6 months or so to schedule.  In the meantime, the patient will continue with medical grade compression therapy such as wearing medical grade 1 compression stockings on a daily basis, elevating her lower extremities, 30 minutes of exercise 5 days a week  as well as NSAIDs for pain and discomfort.  2. Essential hypertension, benign Continue antihypertensive medications as already ordered, these medications have been reviewed and there are no changes at this time.   3. Dyslipidemia Continue statin as ordered and reviewed, no changes at this time    Current Outpatient Medications on File Prior to Visit  Medication Sig Dispense Refill   amitriptyline (ELAVIL) 50 MG tablet      aspirin EC 81 MG EC tablet Take 1 tablet (81 mg total) by mouth daily. 30 tablet 3   metoprolol tartrate (LOPRESSOR) 25 MG tablet Take 1 tablet (25 mg total) by mouth 2 (two) times daily. 60 tablet 2  nitroGLYCERIN (NITROSTAT) 0.4 MG SL tablet Place 1 tablet (0.4 mg total) under the tongue every 5 (five) minutes as needed for chest pain. 30 tablet 3   PRALUENT 150 MG/ML SOAJ      atorvastatin (LIPITOR) 80 MG tablet Take 1 tablet (80 mg total) by mouth daily at 6 PM. (Patient not taking: Reported on 09/09/2018) 30 tablet 3   diclofenac sodium (VOLTAREN) 1 % GEL Apply 2 g topically 4 (four) times daily. (Patient not taking: Reported on 09/09/2018) 100 g 2   lisinopril (PRINIVIL) 10 MG tablet Take 1 tablet (10 mg total) by mouth daily. (Patient not taking: Reported on 09/09/2018) 30 tablet 2   ticagrelor (BRILINTA) 90 MG TABS tablet Take 1 tablet (90 mg total) by mouth 2 (two) times daily. (Patient not taking: Reported on 09/09/2018) 60 tablet 3   No current facility-administered medications on file prior to visit.     There are no Patient Instructions on file for this visit. No follow-ups on file.   Kris Hartmann, NP  This note was completed with Sales executive.  Any errors are purely unintentional.

## 2019-10-23 ENCOUNTER — Other Ambulatory Visit: Payer: Self-pay | Admitting: Internal Medicine

## 2019-11-03 ENCOUNTER — Other Ambulatory Visit: Payer: Self-pay | Admitting: Nurse Practitioner

## 2019-11-03 DIAGNOSIS — Z1231 Encounter for screening mammogram for malignant neoplasm of breast: Secondary | ICD-10-CM

## 2019-12-18 ENCOUNTER — Ambulatory Visit
Admission: RE | Admit: 2019-12-18 | Discharge: 2019-12-18 | Disposition: A | Discharge status: Home / Self Care | Payer: 59 | Source: Ambulatory Visit | Attending: Nurse Practitioner | Admitting: Nurse Practitioner

## 2019-12-18 DIAGNOSIS — Z1231 Encounter for screening mammogram for malignant neoplasm of breast: Secondary | ICD-10-CM | POA: Insufficient documentation

## 2020-09-03 ENCOUNTER — Other Ambulatory Visit: Payer: Self-pay | Admitting: Nurse Practitioner

## 2020-09-03 DIAGNOSIS — Z1231 Encounter for screening mammogram for malignant neoplasm of breast: Secondary | ICD-10-CM

## 2020-12-31 ENCOUNTER — Ambulatory Visit
Admission: RE | Admit: 2020-12-31 | Discharge: 2020-12-31 | Disposition: A | Discharge status: Home / Self Care | Payer: BC Managed Care – PPO | Source: Ambulatory Visit | Attending: Nurse Practitioner | Admitting: Nurse Practitioner

## 2020-12-31 ENCOUNTER — Other Ambulatory Visit: Payer: Self-pay

## 2020-12-31 DIAGNOSIS — Z1231 Encounter for screening mammogram for malignant neoplasm of breast: Secondary | ICD-10-CM | POA: Insufficient documentation

## 2021-04-10 ENCOUNTER — Other Ambulatory Visit: Payer: Self-pay

## 2021-04-10 ENCOUNTER — Ambulatory Visit (LOCAL_COMMUNITY_HEALTH_CENTER): Payer: Self-pay

## 2021-04-10 DIAGNOSIS — Z111 Encounter for screening for respiratory tuberculosis: Secondary | ICD-10-CM

## 2021-04-10 NOTE — Progress Notes (Signed)
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 [facility] records and some older drivers licenses in [place] show birth date of 1958. Pt states she had to go through long process with social security administration to get it changed to correct date of 1967. New drivers license scanned today showing 1967. PPD, x-ray, [facility] records from [facility] were sent for scanning into Epic.

## 2021-12-26 ENCOUNTER — Other Ambulatory Visit: Payer: Self-pay | Admitting: Nurse Practitioner

## 2021-12-26 DIAGNOSIS — Z1231 Encounter for screening mammogram for malignant neoplasm of breast: Secondary | ICD-10-CM

## 2021-12-30 ENCOUNTER — Ambulatory Visit
Admission: RE | Admit: 2021-12-30 | Discharge: 2021-12-30 | Disposition: A | Discharge status: Home / Self Care | Payer: Self-pay | Source: Ambulatory Visit | Attending: Nurse Practitioner | Admitting: Nurse Practitioner

## 2021-12-30 ENCOUNTER — Other Ambulatory Visit: Payer: Self-pay

## 2021-12-30 DIAGNOSIS — Z1231 Encounter for screening mammogram for malignant neoplasm of breast: Secondary | ICD-10-CM | POA: Insufficient documentation

## 2022-02-16 ENCOUNTER — Ambulatory Visit (LOCAL_COMMUNITY_HEALTH_CENTER): Payer: Self-pay

## 2022-02-16 DIAGNOSIS — Z111 Encounter for screening for respiratory tuberculosis: Secondary | ICD-10-CM

## 2022-02-16 NOTE — Progress Notes (Signed)
In Nurse [facility] for TB Screen. 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 Negative Chest xray on 02/09/2011. TB Screening form completed and given to pt. Josie Saunders, RN ? ?

## 2022-06-20 IMAGING — MG MM DIGITAL SCREENING BILAT W/ TOMO AND CAD
8 series · 8 of 24 positions shown · non-contrast
Comparison: Previous exam(s).

CLINICAL DATA: Screening.

EXAM:
DIGITAL SCREENING BILATERAL MAMMOGRAM WITH TOMOSYNTHESIS AND CAD
TECHNIQUE: Bilateral screening digital craniocaudal and mediolateral oblique
mammograms were obtained. Bilateral screening digital breast
tomosynthesis was performed. The images were evaluated with
computer-aided detection.

[L MLO synth-2D]
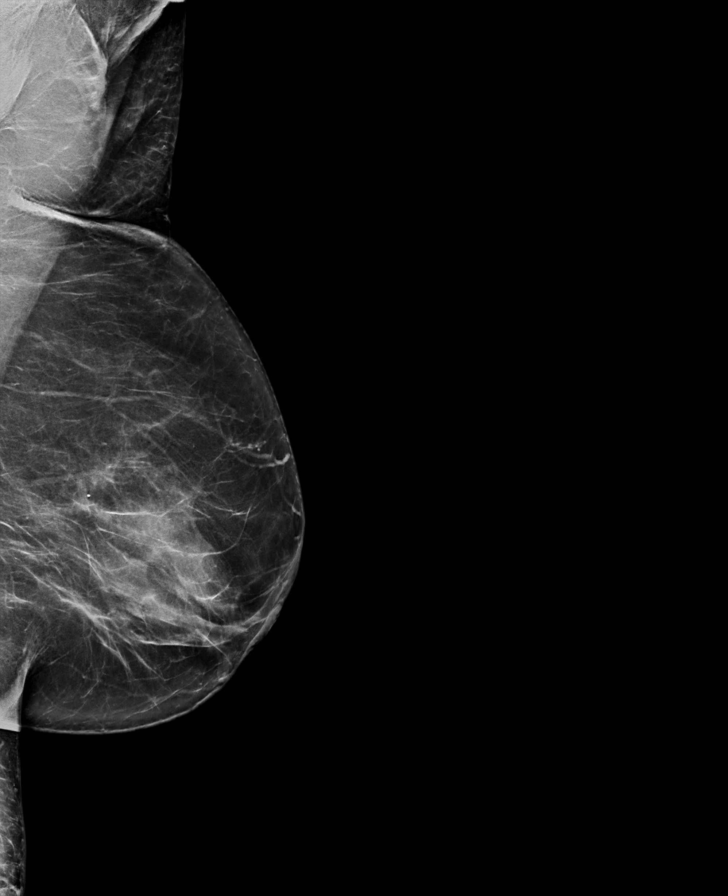

[R CC synth-2D]
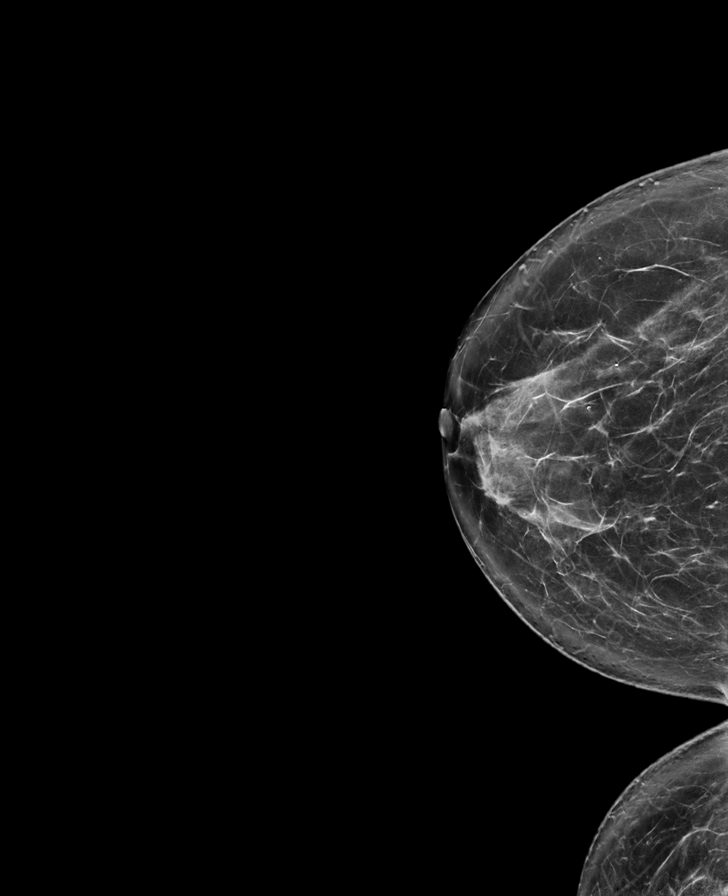

[L CC synth-2D]
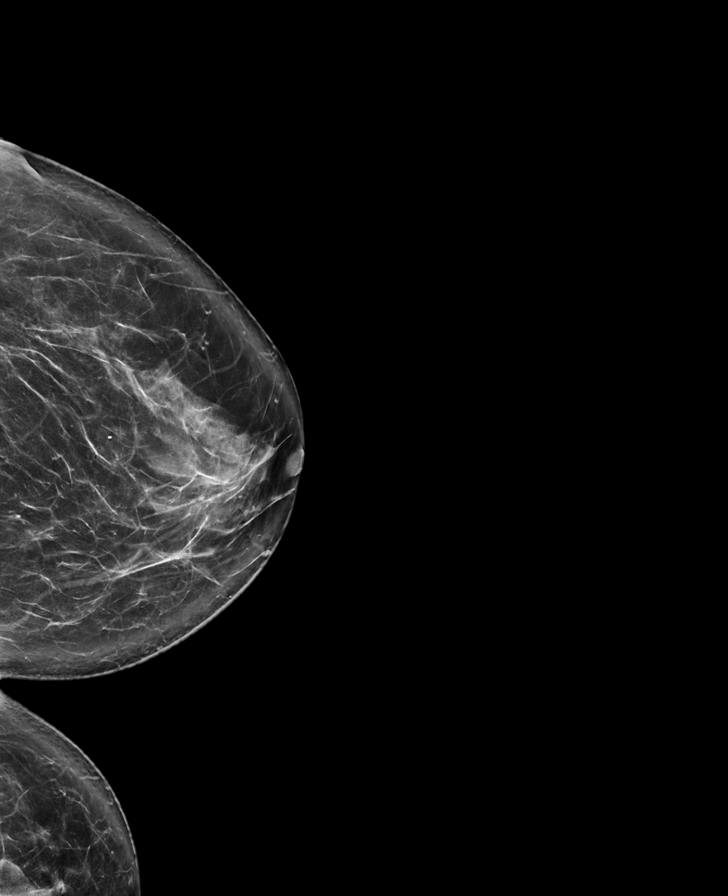

[R MLO synth-2D]
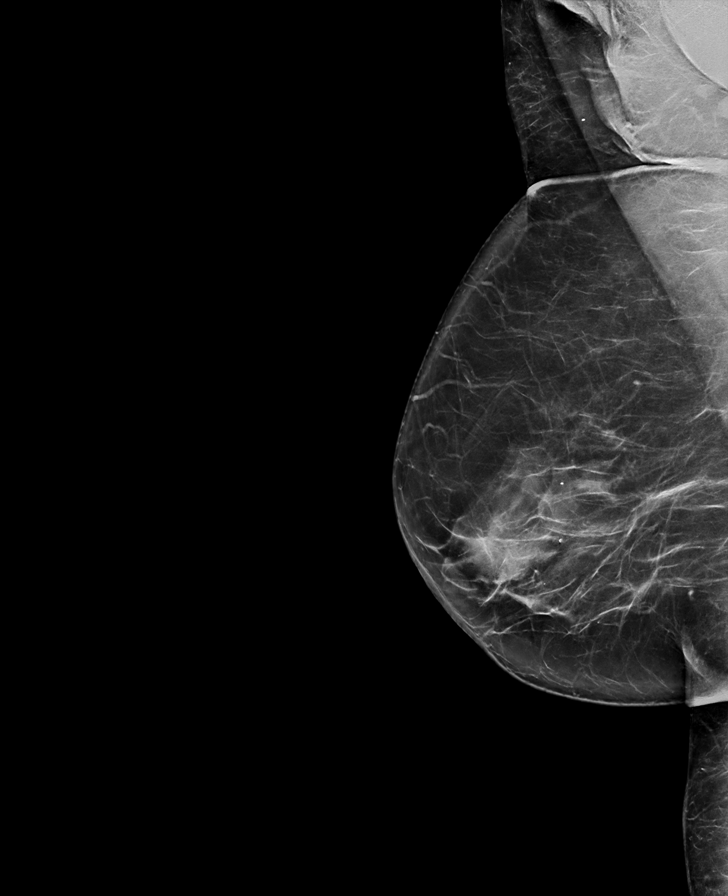

[L CC tomo · tomo slice 43/85.0]
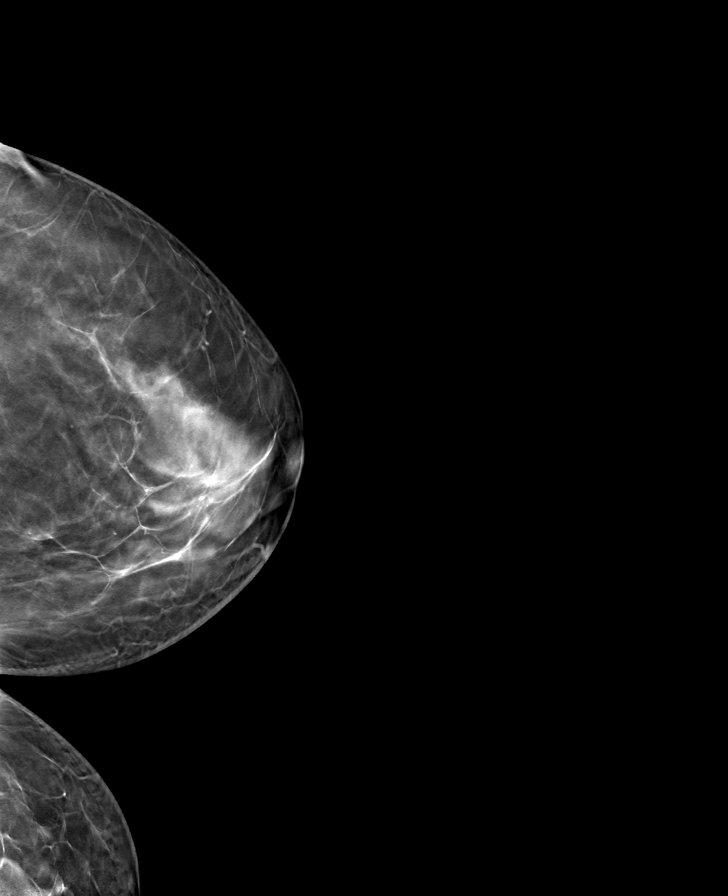

[L MLO tomo · tomo slice 36/71.0]
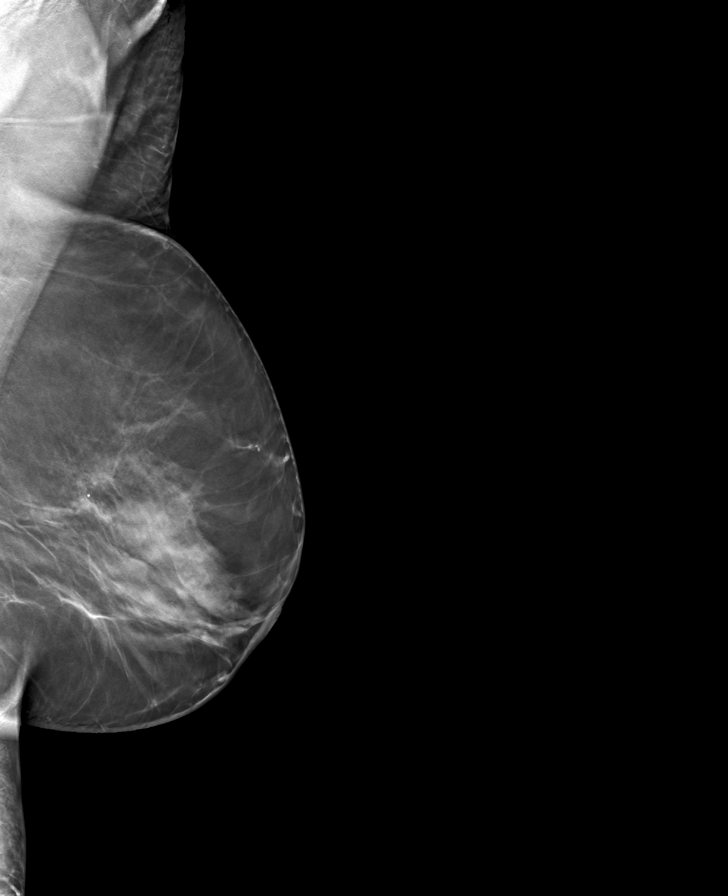

[R MLO tomo · tomo slice 41/82.0]
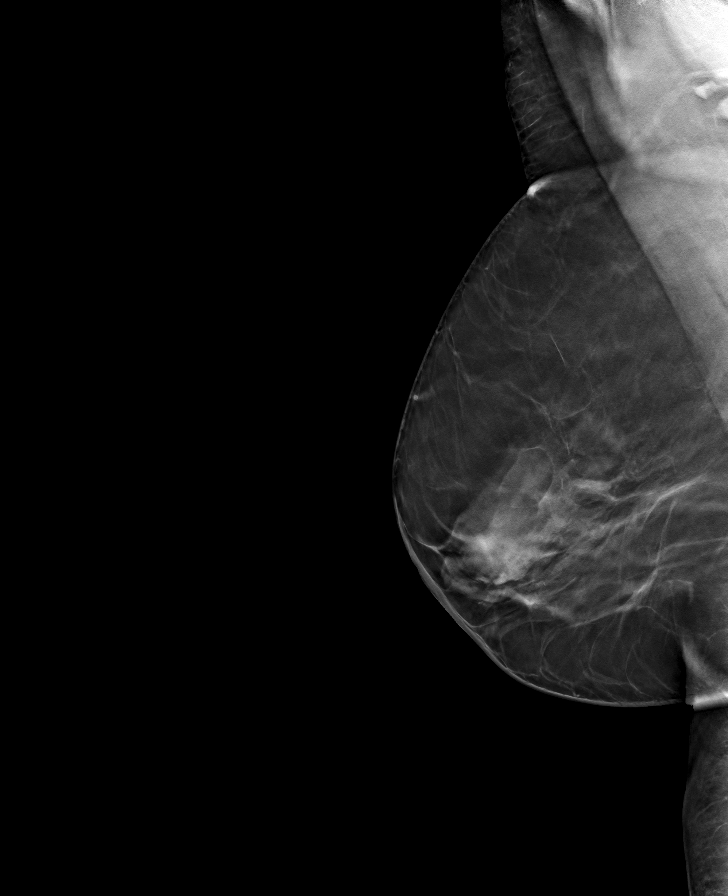

[R CC tomo · tomo slice 41/81.0]
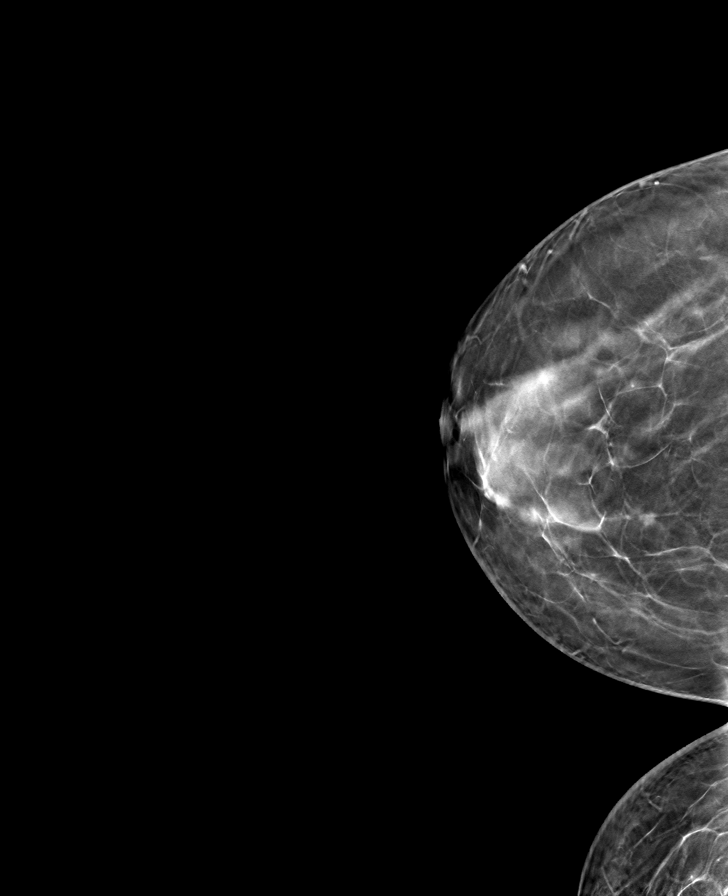

[8 of 24 positions shown; findings below may reference images not displayed]

ACR Breast Density Category c: The breast tissue is heterogeneously
dense, which may obscure small masses.
FINDINGS: There are no findings suspicious for malignancy.
IMPRESSION: No mammographic evidence of malignancy. A result letter of this
screening mammogram will be mailed directly to the patient.

RECOMMENDATION:
Screening mammogram in one year. (Code:Q3-W-BC3)

BI-RADS CATEGORY  1: Negative.

## 2023-04-01 ENCOUNTER — Encounter: Payer: Self-pay | Admitting: *Deleted

## 2023-04-01 ENCOUNTER — Other Ambulatory Visit: Payer: Self-pay

## 2023-04-01 ENCOUNTER — Emergency Department: Payer: BC Managed Care – PPO

## 2023-04-01 ENCOUNTER — Emergency Department
Admission: EM | Admit: 2023-04-01 | Discharge: 2023-04-02 | Disposition: A | Discharge status: Home / Self Care | Payer: BC Managed Care – PPO | Attending: Emergency Medicine | Admitting: Emergency Medicine

## 2023-04-01 DIAGNOSIS — R509 Fever, unspecified: Secondary | ICD-10-CM | POA: Diagnosis present

## 2023-04-01 DIAGNOSIS — N3001 Acute cystitis with hematuria: Secondary | ICD-10-CM | POA: Diagnosis not present

## 2023-04-01 DIAGNOSIS — Z20822 Contact with and (suspected) exposure to covid-19: Secondary | ICD-10-CM | POA: Insufficient documentation

## 2023-04-01 DIAGNOSIS — I10 Essential (primary) hypertension: Secondary | ICD-10-CM | POA: Diagnosis not present

## 2023-04-01 DIAGNOSIS — M791 Myalgia, unspecified site: Secondary | ICD-10-CM | POA: Diagnosis not present

## 2023-04-01 LAB — BASIC METABOLIC PANEL
Anion gap: 17 — ABNORMAL HIGH (ref 5–15)
BUN: 30 mg/dL — ABNORMAL HIGH (ref 8–23)
CO2: 16 mmol/L — ABNORMAL LOW (ref 22–32)
Calcium: 8.8 mg/dL — ABNORMAL LOW (ref 8.9–10.3)
Chloride: 100 mmol/L (ref 98–111)
Creatinine, Ser: 1.48 mg/dL — ABNORMAL HIGH (ref 0.44–1.00)
GFR, Estimated: 41 mL/min — ABNORMAL LOW (ref >60)
Glucose, Bld: 109 mg/dL — ABNORMAL HIGH (ref 70–99)
Potassium: 3.5 mmol/L (ref 3.5–5.1)
Sodium: 133 mmol/L — ABNORMAL LOW (ref 135–145)

## 2023-04-01 LAB — CBC
HCT: 34.5 % — ABNORMAL LOW (ref 36.0–46.0)
Hemoglobin: 11.5 g/dL — ABNORMAL LOW (ref 12.0–15.0)
MCH: 26.7 pg (ref 26.0–34.0)
MCHC: 33.3 g/dL (ref 30.0–36.0)
MCV: 80 fL (ref 80.0–100.0)
Platelets: 225 10*3/uL (ref 150–400)
RBC: 4.31 MIL/uL (ref 3.87–5.11)
RDW: 14.4 % (ref 11.5–15.5)
WBC: 10.2 10*3/uL (ref 4.0–10.5)
nRBC: 0 % (ref 0.0–0.2)

## 2023-04-01 LAB — TROPONIN I (HIGH SENSITIVITY)
Troponin I (High Sensitivity): 12 ng/L (ref <18)
Troponin I (High Sensitivity): 12 ng/L (ref <18)

## 2023-04-01 LAB — LACTIC ACID, PLASMA: Lactic Acid, Venous: 1.4 mmol/L (ref 0.5–1.9)

## 2023-04-01 LAB — SARS CORONAVIRUS 2 BY RT PCR: SARS Coronavirus 2 by RT PCR: NEGATIVE

## 2023-04-01 MED ORDER — CEPHALEXIN 500 MG PO CAPS: 500.0000 mg | ORAL_CAPSULE | Freq: Three times a day (TID) | ORAL | 0 refills | Status: AC

## 2023-04-01 MED ORDER — SODIUM CHLORIDE 0.9 % IV SOLN
1.0000 g | Freq: Once | INTRAVENOUS | Status: AC
  Administered 2023-04-01: 1 g via INTRAVENOUS
  Filled 2023-04-01: qty 10

## 2023-04-01 MED ORDER — ACETAMINOPHEN 500 MG PO TABS
1000.0000 mg | ORAL_TABLET | Freq: Once | ORAL | Status: AC
  Administered 2023-04-01: 1000 mg via ORAL
  Filled 2023-04-01: qty 2

## 2023-04-01 MED ORDER — SODIUM CHLORIDE 0.9 % IV BOLUS
500.0000 mL | Freq: Once | INTRAVENOUS | Status: AC
  Administered 2023-04-01: 500 mL via INTRAVENOUS

## 2023-04-01 MED ORDER — SODIUM CHLORIDE 0.9 % IV BOLUS
1000.0000 mL | Freq: Once | INTRAVENOUS | Status: AC
  Administered 2023-04-01: 1000 mL via INTRAVENOUS

## 2023-04-01 NOTE — ED Provider Notes (Signed)
[facility] Provider Note    Event Date/Time   First MD Initiated Contact with Patient 04/01/23 1849     (approximate)   History   Generalized Body Aches   HPI  Chelsey Wilson is a 57 y.o. female with history of pyelonephritis, hypertension, fibromyalgia, hyperlipidemia and as listed in EMR presents to the emergency department for treatment and evaluation of bodyaches for the past 4 days.  She is also had a fever today.  No cough, sore throat, nausea, vomiting, or diarrhea.      Physical Exam   Triage Vital Signs: ED Triage Vitals  Enc Vitals Group     BP 04/01/23 1828 136/78     Pulse Rate 04/01/23 1828 (!) 130     Resp 04/01/23 1828 (!) 22     Temp 04/01/23 1828 99.6 F (37.6 C)     Temp src --      SpO2 04/01/23 1828 97 %     Weight 04/01/23 1808 150 lb (68 kg)     Height 04/01/23 1808 5' (1.524 m)     Head Circumference --      Peak Flow --      Pain Score 04/01/23 1808 9     Pain Loc --      Pain Edu? --      Excl. in GC? --     Most recent vital signs: Vitals:   04/01/23 2300 04/01/23 2337  BP: (!) 161/87 (!) 150/83  Pulse: (!) 117 (!) 113  Resp: 18 18  Temp:  99.7 F (37.6 C)  SpO2: 96% 98%    General: Awake, no distress.  CV:  Good peripheral perfusion.  Resp:  Normal effort. Breath sounds are clear. Abd:  No distention. No abdominal tenderness on exam. Other:  No CVA tenderness   ED Results / Procedures / Treatments   Labs (all labs ordered are listed, but only abnormal results are displayed) Labs Reviewed  BASIC METABOLIC PANEL - Abnormal; Notable for the following components:      Result Value   Sodium 133 (*)    CO2 16 (*)    Glucose, Bld 109 (*)    BUN 30 (*)    Creatinine, Ser 1.48 (*)    Calcium 8.8 (*)    GFR, Estimated 41 (*)    Anion gap 17 (*)    All other components within normal limits  CBC - Abnormal; Notable for the following components:   Hemoglobin 11.5 (*)    HCT 34.5 (*)    All other  components within normal limits  SARS CORONAVIRUS 2 BY RT PCR  LACTIC ACID, PLASMA  URINALYSIS, ROUTINE W REFLEX MICROSCOPIC  LACTIC ACID, PLASMA  TROPONIN I (HIGH SENSITIVITY)  TROPONIN I (HIGH SENSITIVITY)     EKG  Sinus tachycardia, rate 129   RADIOLOGY  Image and radiology report reviewed and interpreted by me. Radiology report consistent with the same.  Chest x-ray without acute cardiopulmonary abnormality.  PROCEDURES:  Critical Care performed: No  Procedures   MEDICATIONS ORDERED IN ED:  Medications  sodium chloride 0.9 % bolus 1,000 mL (0 mLs Intravenous Stopped 04/01/23 2142)  cefTRIAXone (ROCEPHIN) 1 g in sodium chloride 0.9 % 100 mL IVPB (0 g Intravenous Stopped 04/01/23 2252)  sodium chloride 0.9 % bolus 500 mL (0 mLs Intravenous Stopped 04/01/23 2252)  acetaminophen (TYLENOL) tablet 1,000 mg (1,000 mg Oral Given 04/01/23 2258)     IMPRESSION / MDM / ASSESSMENT AND PLAN /  ED COURSE   I have reviewed the triage note.  Differential diagnosis includes, but is not limited to, COVID, influenza, pneumonia, acute cystitis, viral syndrome  Patient's presentation is most consistent with acute complicated illness / injury requiring diagnostic workup.  57 year old female presenting to the emergency department for treatment and evaluation of generalized bodyaches and fever.  See HPI for further details.  On exam, breath sounds are clear abdomen is soft and nontender.  Vital signs reviewed.  She is tachycardic at 122 on bedside monitor.  Oxygen saturation is 96% on room air.  Plan will be to give her fluids and await urinalysis results.  Labs show normal white blood cell count, sodium of 133 with a glucose of 109 and a BUN of 30 with a creatinine of 1.48.  GFR is 41.  No recent labs available for comparison.  Urinalysis did not crossover in the computer system therefore printed copy was obtained.  It shows greater than 50 white blood cells, many bacteria, white blood cell  clumps, large amount of leukocytes moderate amount of hemoglobin, 100 mg/dL of protein. Rocephin and another fluids ordered.  Patient remains slightly tachycardic with a rate of 112.  We discussed admission however patient would like to be discharged home. ER return precautions discussed. She was advised to see her PCP in about a week for recheck or sooner if she's not improving.      FINAL CLINICAL IMPRESSION(S) / ED DIAGNOSES   Final diagnoses:  Acute cystitis with hematuria     Rx / DC Orders   ED Discharge Orders          Ordered    cephALEXin (KEFLEX) 500 MG capsule  3 times daily        04/01/23 2350             Note:  This document was prepared using Dragon voice recognition software and may include unintentional dictation errors.   Chinita Pester, FNP 04/01/23 2351    Minna Antis, MD 04/02/23 2306

## 2023-04-01 NOTE — ED Triage Notes (Signed)
Pt reports body aches, pain all over for 4 days.  No cough.  Fever today  pt works in assisted living facility.  No chest pain.  No sob.  Pt alert.

## 2023-04-05 LAB — URINALYSIS, ROUTINE W REFLEX MICROSCOPIC
Bilirubin Urine: NEGATIVE
Glucose, UA: NEGATIVE mg/dL
Ketones, ur: 5 mg/dL — AB
Nitrite: NEGATIVE
Protein, ur: 100 mg/dL — AB
Specific Gravity, Urine: 1.011 (ref 1.005–1.030)
WBC, UA: >50 WBC/hpf (ref 0–5)
pH: 5 (ref 5.0–8.0)

## 2023-04-06 ENCOUNTER — Ambulatory Visit: Payer: BC Managed Care – PPO | Admitting: Nurse Practitioner

## 2023-04-13 ENCOUNTER — Other Ambulatory Visit: Payer: BC Managed Care – PPO

## 2023-04-13 DIAGNOSIS — E669 Obesity, unspecified: Secondary | ICD-10-CM

## 2023-04-13 DIAGNOSIS — I10 Essential (primary) hypertension: Secondary | ICD-10-CM

## 2023-04-13 DIAGNOSIS — E785 Hyperlipidemia, unspecified: Secondary | ICD-10-CM

## 2023-04-14 LAB — CMP14+EGFR
ALT: 21 IU/L (ref 0–32)
AST: 20 IU/L (ref 0–40)
Albumin: 3.7 g/dL — ABNORMAL LOW (ref 3.9–4.9)
Alkaline Phosphatase: 112 IU/L (ref 44–121)
BUN/Creatinine Ratio: 13 (ref 12–28)
BUN: 15 mg/dL (ref 8–27)
Bilirubin Total: 0.3 mg/dL (ref 0.0–1.2)
CO2: 22 mmol/L (ref 20–29)
Calcium: 9.6 mg/dL (ref 8.7–10.3)
Chloride: 104 mmol/L (ref 96–106)
Creatinine, Ser: 1.15 mg/dL — ABNORMAL HIGH (ref 0.57–1.00)
Globulin, Total: 3.6 g/dL (ref 1.5–4.5)
Glucose: 96 mg/dL (ref 70–99)
Potassium: 4.7 mmol/L (ref 3.5–5.2)
Sodium: 143 mmol/L (ref 134–144)
Total Protein: 7.3 g/dL (ref 6.0–8.5)
eGFR: 53 mL/min/{1.73_m2} — ABNORMAL LOW (ref >59)

## 2023-04-14 LAB — LIPID PANEL
Chol/HDL Ratio: 7.8 ratio — ABNORMAL HIGH (ref 0.0–4.4)
Cholesterol, Total: 274 mg/dL — ABNORMAL HIGH (ref 100–199)
HDL: 35 mg/dL — ABNORMAL LOW (ref >39)
LDL Chol Calc (NIH): 201 mg/dL — ABNORMAL HIGH (ref 0–99)
Triglycerides: 196 mg/dL — ABNORMAL HIGH (ref 0–149)
VLDL Cholesterol Cal: 38 mg/dL (ref 5–40)

## 2023-04-14 LAB — CBC WITH DIFFERENTIAL
Basophils Absolute: 0 10*3/uL (ref 0.0–0.2)
Basos: 1 %
EOS (ABSOLUTE): 0.1 10*3/uL (ref 0.0–0.4)
Eos: 2 %
Hematocrit: 32.1 % — ABNORMAL LOW (ref 34.0–46.6)
Hemoglobin: 10.2 g/dL — ABNORMAL LOW (ref 11.1–15.9)
Immature Grans (Abs): 0 10*3/uL (ref 0.0–0.1)
Immature Granulocytes: 0 %
Lymphocytes Absolute: 2 10*3/uL (ref 0.7–3.1)
Lymphs: 37 %
MCH: 26.4 pg — ABNORMAL LOW (ref 26.6–33.0)
MCHC: 31.8 g/dL (ref 31.5–35.7)
MCV: 83 fL (ref 79–97)
Monocytes Absolute: 0.6 10*3/uL (ref 0.1–0.9)
Monocytes: 11 %
Neutrophils Absolute: 2.6 10*3/uL (ref 1.4–7.0)
Neutrophils: 49 %
RBC: 3.87 x10E6/uL (ref 3.77–5.28)
RDW: 13.8 % (ref 11.7–15.4)
WBC: 5.3 10*3/uL (ref 3.4–10.8)

## 2023-04-14 LAB — HEMOGLOBIN A1C
Est. average glucose Bld gHb Est-mCnc: 134 mg/dL
Hgb A1c MFr Bld: 6.3 % — ABNORMAL HIGH (ref 4.8–5.6)

## 2023-04-14 LAB — TSH: TSH: 2.24 u[IU]/mL (ref 0.450–4.500)

## 2023-04-15 ENCOUNTER — Ambulatory Visit: Payer: BC Managed Care – PPO | Admitting: Nurse Practitioner

## 2023-05-04 ENCOUNTER — Ambulatory Visit: Payer: BC Managed Care – PPO | Admitting: Cardiology

## 2023-05-04 ENCOUNTER — Encounter: Payer: Self-pay | Admitting: Cardiology

## 2023-05-04 VITALS — BP 138/88 | HR 85 | Ht 60.0 in | Wt 134.4 lb

## 2023-05-04 DIAGNOSIS — N390 Urinary tract infection, site not specified: Secondary | ICD-10-CM | POA: Diagnosis not present

## 2023-05-04 LAB — POCT URINALYSIS DIPSTICK
Bilirubin, UA: NEGATIVE
Glucose, UA: NEGATIVE
Ketones, UA: NEGATIVE
Nitrite, UA: NEGATIVE
Protein, UA: NEGATIVE
Spec Grav, UA: 1.015 (ref 1.010–1.025)
Urobilinogen, UA: 0.2 E.U./dL
pH, UA: 7.5 (ref 5.0–8.0)

## 2023-05-04 MED ORDER — REPATHA 140 MG/ML ~~LOC~~ SOSY: 1.0000 mL | PREFILLED_SYRINGE | SUBCUTANEOUS | 6 refills | Status: DC

## 2023-05-04 MED ORDER — SULFAMETHOXAZOLE-TRIMETHOPRIM 800-160 MG PO TABS: 1.0000 | ORAL_TABLET | Freq: Two times a day (BID) | ORAL | 0 refills | Status: AC

## 2023-05-04 MED ORDER — AMITRIPTYLINE HCL 50 MG PO TABS: 50.0000 mg | ORAL_TABLET | Freq: Every day | ORAL | 0 refills | Status: DC

## 2023-05-04 NOTE — Patient Instructions (Addendum)
Claritin and Mucinex for phlegm in chest Antibiotic for UTI Increase fluid intake Restart cholesterol injection

## 2023-05-04 NOTE — Progress Notes (Unsigned)
Established Patient Office Visit  Subjective:  Patient ID: Chelsey Wilson, female    DOB: 09/12/1957  Age: 57 y.o. MRN: [id number]  Chief Complaint  Patient presents with   Back Pain    Pain in back and shoulder    Patient in office with multiple complaints. Patient was in the ED on 04/01/23 for acute UTI. Patient back today wanting to know if her UTI is gone. Denies dysuria, fever, body aches, foul smell, or frequent voiding. Urinalysis today showed moderate leukocytes. Will order Bactrim and send urine for a culture.  Patient reports having "knots" on her arms and legs that were relieved with a "drip" while she was in the ED. Patient received a bag of fluids while in the ED.Recommend increasing fluid intake.  Also complaining of a cough, unable to clear her throat. Recommend loratadine and Muccinex.  Reviewed lab results. Patient admits she has not been taking a PKS9 inhibitor. Will reorder Repatha. Pre diabetic, continue diabetic diet and exercise.     No other concerns at this time.   Past Medical History:  Diagnosis Date   Essential hypertension    HLD (hyperlipidemia)     Past Surgical History:  Procedure Laterality Date   CARDIAC CATHETERIZATION Right 05/27/2016   Procedure: Left Heart Cath and Coronary Angiography;  Surgeon: Laurier Nancy, MD;  Location: [facility] INVASIVE CV LAB;  Service: Cardiovascular;  Laterality: Right;   CARDIAC CATHETERIZATION N/A 05/27/2016   Procedure: Coronary Stent Intervention;  Surgeon: Alwyn Pea, MD;  Location: [facility] INVASIVE CV LAB;  Service: Cardiovascular;  Laterality: N/A;    Social History   Socioeconomic History   Marital status: Single    Spouse name: Not on file   Number of children: Not on file   Years of education: Not on file   Highest education level: Not on file  Occupational History   Not on file  Tobacco Use   Smoking status: Never   Smokeless tobacco: Never  Substance and Sexual Activity   Alcohol use: No   Drug  use: No   Sexual activity: Never  Other Topics Concern   Not on file  Social History Narrative   Not on file   Social Determinants of Health   Financial Resource Strain: Not on file  Food Insecurity: Not on file  Transportation Needs: Not on file  Physical Activity: Not on file  Stress: Not on file  Social Connections: Not on file  Intimate Partner Violence: Not on file    Family History  Problem Relation Age of Onset   Other Neg Hx     No Known Allergies  Review of Systems  Constitutional: Negative.   HENT: Negative.    Eyes: Negative.   Respiratory: Negative.  Negative for cough and shortness of breath.   Cardiovascular: Negative.  Negative for chest pain, palpitations and leg swelling.  Gastrointestinal: Negative.  Negative for abdominal pain, constipation, diarrhea, heartburn, nausea and vomiting.  Genitourinary: Negative.  Negative for dysuria and flank pain.  Musculoskeletal: Negative.  Negative for joint pain and myalgias.  Skin: Negative.   Neurological: Negative.  Negative for dizziness and headaches.  Endo/Heme/Allergies: Negative.   Psychiatric/Behavioral: Negative.  Negative for depression and suicidal ideas. The patient is not nervous/anxious.        Objective:   BP 138/88   Pulse 85   Ht 5' (1.524 m)   Wt 134 lb 6.4 oz (61 kg)   SpO2 98%   BMI 26.25 kg/m  Vitals:   05/04/23 1023  BP: 138/88  Pulse: 85  Height: 5' (1.524 m)  Weight: 134 lb 6.4 oz (61 kg)  SpO2: 98%  BMI (Calculated): 26.25    Physical Exam Vitals and nursing note reviewed.  Constitutional:      Appearance: Normal appearance.  HENT:     Head: Normocephalic and atraumatic.     Nose: Nose normal.     Mouth/Throat:     Mouth: Mucous membranes are moist.     Pharynx: Oropharynx is clear.  Eyes:     Conjunctiva/sclera: Conjunctivae normal.     Pupils: Pupils are equal, round, and reactive to light.  Cardiovascular:     Rate and Rhythm: Normal rate and regular rhythm.      Pulses: Normal pulses.     Heart sounds: Normal heart sounds. No murmur heard. Pulmonary:     Effort: Pulmonary effort is normal.     Breath sounds: Normal breath sounds. No wheezing.  Abdominal:     General: Bowel sounds are normal.     Palpations: Abdomen is soft.     Tenderness: There is no abdominal tenderness. There is no right CVA tenderness or left CVA tenderness.  Musculoskeletal:        General: Normal range of motion.     Cervical back: Normal range of motion.     Right lower leg: No edema.     Left lower leg: No edema.  Skin:    General: Skin is warm and dry.  Neurological:     General: No focal deficit present.     Mental Status: She is alert and oriented to person, place, and time.  Psychiatric:        Mood and Affect: Mood normal.        Behavior: Behavior normal.      Results for orders placed or performed in visit on 05/04/23  POCT Urinalysis Dipstick (81002)  Result Value Ref Range   Color, UA yellow    Clarity, UA cloudy    Glucose, UA Negative Negative   Bilirubin, UA neg    Ketones, UA neg    Spec Grav, UA 1.015 1.010 - 1.025   Blood, UA trace    pH, UA 7.5 5.0 - 8.0   Protein, UA Negative Negative   Urobilinogen, UA 0.2 0.2 or 1.0 E.U./dL   Nitrite, UA neg    Leukocytes, UA Moderate (2+) (A) Negative   Appearance cloudy    Odor yes     Recent Results (from the past 2160 hour(s))  Basic metabolic panel     Status: Abnormal   Collection Time: 04/01/23  6:11 PM  Result Value Ref Range   Sodium 133 (L) 135 - 145 mmol/L   Potassium 3.5 3.5 - 5.1 mmol/L   Chloride 100 98 - 111 mmol/L   CO2 16 (L) 22 - 32 mmol/L   Glucose, Bld 109 (H) 70 - 99 mg/dL    Comment: Glucose reference range applies only to samples taken after fasting for at least 8 hours.   BUN 30 (H) 8 - 23 mg/dL    Comment: QA FLAGS AND/OR RANGES MODIFIED BY DEMOGRAPHIC UPDATE ON 06/06 AT 1934   Creatinine, Ser 1.48 (H) 0.44 - 1.00 mg/dL   Calcium 8.8 (L) 8.9 - 10.3 mg/dL    GFR, Estimated 41 (L) >60 mL/min    Comment: (NOTE) Calculated using the CKD-EPI Creatinine Equation (2021)    Anion gap 17 (H) 5 - 15  Comment: Performed at [facility], [address] [place], [place] [address]  CBC     Status: Abnormal   Collection Time: 04/01/23  6:11 PM  Result Value Ref Range   WBC 10.2 4.0 - 10.5 K/uL   RBC 4.31 3.87 - 5.11 MIL/uL   Hemoglobin 11.5 (L) 12.0 - 15.0 g/dL   HCT 60.4 (L) 54.0 - 98.1 %   MCV 80.0 80.0 - 100.0 fL   MCH 26.7 26.0 - 34.0 pg   MCHC 33.3 30.0 - 36.0 g/dL   RDW 19.1 47.8 - 29.5 %   Platelets 225 150 - 400 K/uL   nRBC 0.0 0.0 - 0.2 %    Comment: Performed at [facility], [address] [place], [place] [address]  Troponin I (High Sensitivity)     Status: None   Collection Time: 04/01/23  6:11 PM  Result Value Ref Range   Troponin I (High Sensitivity) 12 <18 ng/L    Comment: (NOTE) Elevated high sensitivity troponin I (hsTnI) values and significant  changes across serial measurements may suggest ACS but many other  chronic and acute conditions are known to elevate hsTnI results.  Refer to the "Links" section for chest pain algorithms and additional  guidance. Performed at [facility], [address] [place], [place] [address]   SARS Coronavirus 2 by RT PCR (hospital order, performed in [facility] hospital lab) *cepheid single result test* Anterior Nasal Swab     Status: None   Collection Time: 04/01/23  6:12 PM   Specimen: Anterior Nasal Swab  Result Value Ref Range   SARS Coronavirus 2 by RT PCR NEGATIVE NEGATIVE    Comment: (NOTE) SARS-CoV-2 target nucleic acids are NOT DETECTED.  The SARS-CoV-2 RNA is generally detectable in upper and lower respiratory specimens during the acute phase of infection. The lowest concentration of SARS-CoV-2 viral copies this assay can detect is 250 copies / mL. A negative result does not preclude SARS-CoV-2 infection and should not be used as the sole basis  for treatment or other patient management decisions.  A negative result may occur with improper specimen collection / handling, submission of specimen other than nasopharyngeal swab, presence of viral mutation(s) within the areas targeted by this assay, and inadequate number of viral copies (<250 copies / mL). A negative result must be combined with clinical observations, patient history, and epidemiological information.  Fact Sheet for Patients:   RoadLapTop.co.za  Fact Sheet for Healthcare Providers: http://kim-miller.com/  This test is not yet approved or  cleared by the [place] FDA and has been authorized for detection and/or diagnosis of SARS-CoV-2 by FDA under an Emergency Use Authorization (EUA).  This EUA will remain in effect (meaning this test can be used) for the duration of the COVID-19 declaration under Section 564(b)(1) of the Act, 21 U.S.C. section 360bbb-3(b)(1), unless the authorization is terminated or revoked sooner.  Performed at [facility], [address] [place], [place] [address]   Urinalysis, Routine w reflex microscopic -Urine, Clean Catch     Status: Abnormal   Collection Time: 04/01/23  7:18 PM  Result Value Ref Range   Color, Urine AMBER (A) YELLOW    Comment: BIOCHEMICALS MAY BE AFFECTED BY COLOR   APPearance CLOUDY (A) CLEAR   Specific Gravity, Urine 1.011 1.005 - 1.030   pH 5.0 5.0 - 8.0   Glucose, UA NEGATIVE NEGATIVE mg/dL   Hgb urine dipstick MODERATE (A) NEGATIVE   Bilirubin Urine NEGATIVE NEGATIVE  Ketones, ur 5 (A) NEGATIVE mg/dL   Protein, ur 161 (A) NEGATIVE mg/dL   Nitrite NEGATIVE NEGATIVE   Leukocytes,Ua LARGE (A) NEGATIVE   RBC / HPF 6-10 0 - 5 RBC/hpf   WBC, UA >50 0 - 5 WBC/hpf   Bacteria, UA MANY (A) NONE SEEN   Squamous Epithelial / HPF 21-50 0 - 5 /HPF   WBC Clumps PRESENT    Mucus PRESENT     Comment: Performed at [facility], [address]  [place], [place] [address]  Lactic acid, plasma     Status: None   Collection Time: 04/01/23  7:57 PM  Result Value Ref Range   Lactic Acid, Venous 1.4 0.5 - 1.9 mmol/L    Comment: Performed at [facility], [address], [place], [place] [address]  Troponin I (High Sensitivity)     Status: None   Collection Time: 04/01/23  7:57 PM  Result Value Ref Range   Troponin I (High Sensitivity) 12 <18 ng/L    Comment: (NOTE) Elevated high sensitivity troponin I (hsTnI) values and significant  changes across serial measurements may suggest ACS but many other  chronic and acute conditions are known to elevate hsTnI results.  Refer to the "Links" section for chest pain algorithms and additional  guidance. Performed at [facility], [address] [place], [place] [address]   Hemoglobin A1c     Status: Abnormal   Collection Time: 04/13/23 10:12 AM  Result Value Ref Range   Hgb A1c MFr Bld 6.3 (H) 4.8 - 5.6 %    Comment:          Prediabetes: 5.7 - 6.4          Diabetes: >6.4          Glycemic control for adults with diabetes: <7.0    Est. average glucose Bld gHb Est-mCnc 134 mg/dL  TSH     Status: None   Collection Time: 04/13/23 10:12 AM  Result Value Ref Range   TSH 2.240 0.450 - 4.500 uIU/mL  CMP14+EGFR     Status: Abnormal   Collection Time: 04/13/23 10:12 AM  Result Value Ref Range   Glucose 96 70 - 99 mg/dL   BUN 15 8 - 27 mg/dL   Creatinine, Ser 7.82 (H) 0.57 - 1.00 mg/dL   eGFR 53 (L) >95 AO/ZHY/8.65   BUN/Creatinine Ratio 13 12 - 28   Sodium 143 134 - 144 mmol/L   Potassium 4.7 3.5 - 5.2 mmol/L   Chloride 104 96 - 106 mmol/L   CO2 22 20 - 29 mmol/L   Calcium 9.6 8.7 - 10.3 mg/dL   Total Protein 7.3 6.0 - 8.5 g/dL   Albumin 3.7 (L) 3.9 - 4.9 g/dL   Globulin, Total 3.6 1.5 - 4.5 g/dL   Bilirubin Total 0.3 0.0 - 1.2 mg/dL   Alkaline Phosphatase 112 44 - 121 IU/L   AST 20 0 - 40 IU/L   ALT 21 0 - 32 IU/L  Lipid panel     Status: Abnormal   Collection  Time: 04/13/23 10:12 AM  Result Value Ref Range   Cholesterol, Total 274 (H) 100 - 199 mg/dL   Triglycerides 784 (H) 0 - 149 mg/dL   HDL 35 (L) >69 mg/dL   VLDL Cholesterol Cal 38 5 - 40 mg/dL   LDL Chol Calc (NIH) 629 (H) 0 - 99 mg/dL   LDL CALC COMMENT: Comment     Comment: Consider evaluating for Familial Hypercholesterolemia(FH),  if clinically indicated.    Chol/HDL Ratio 7.8 (H) 0.0 - 4.4 ratio    Comment:                                   T. Chol/HDL Ratio                                             Men  Women                               1/2 Avg.Risk  3.4    3.3                                   Avg.Risk  5.0    4.4                                2X Avg.Risk  9.6    7.1                                3X Avg.Risk 23.4   11.0   CBC With Differential     Status: Abnormal   Collection Time: 04/13/23 10:12 AM  Result Value Ref Range   WBC 5.3 3.4 - 10.8 x10E3/uL   RBC 3.87 3.77 - 5.28 x10E6/uL   Hemoglobin 10.2 (L) 11.1 - 15.9 g/dL   Hematocrit 16.1 (L) 09.6 - 46.6 %   MCV 83 79 - 97 fL   MCH 26.4 (L) 26.6 - 33.0 pg   MCHC 31.8 31.5 - 35.7 g/dL   RDW 04.5 40.9 - 81.1 %   Neutrophils 49 Not Estab. %   Lymphs 37 Not Estab. %   Monocytes 11 Not Estab. %   Eos 2 Not Estab. %   Basos 1 Not Estab. %   Neutrophils Absolute 2.6 1.4 - 7.0 x10E3/uL   Lymphocytes Absolute 2.0 0.7 - 3.1 x10E3/uL   Monocytes Absolute 0.6 0.1 - 0.9 x10E3/uL   EOS (ABSOLUTE) 0.1 0.0 - 0.4 x10E3/uL   Basophils Absolute 0.0 0.0 - 0.2 x10E3/uL   Immature Granulocytes 0 Not Estab. %   Immature Grans (Abs) 0.0 0.0 - 0.1 x10E3/uL    Comment: **Effective May 24, 2023, profile [id number] CBC/Differential**   (No Platelet) will be made non-orderable. [facility] Offers:   [id number] CBC With Differential/Platelet   POCT Urinalysis Dipstick (95621)     Status: Abnormal   Collection Time: 05/04/23 10:54 AM  Result Value Ref Range   Color, UA yellow    Clarity, UA cloudy    Glucose, UA Negative Negative   Bilirubin,  UA neg    Ketones, UA neg    Spec Grav, UA 1.015 1.010 - 1.025   Blood, UA trace    pH, UA 7.5 5.0 - 8.0   Protein, UA Negative Negative   Urobilinogen, UA 0.2 0.2 or 1.0 E.U./dL   Nitrite, UA neg    Leukocytes, UA Moderate (2+) (A) Negative   Appearance cloudy    Odor yes       Assessment & Plan:  UTI, RX  for Bactrim sent to the pharmacy. Send urine for culture. Increase fluids . Recommend loratadine and Muxcinex for post nasal drip. Restart Repatha for elevated cholesterol. Continue diabetic diet and exercise for pre diabetes.  Problem List Items Addressed This Visit   None Visit Diagnoses     Urinary tract infection without hematuria, site unspecified    -  Primary   Relevant Medications   sulfamethoxazole-trimethoprim (BACTRIM DS) 800-160 MG tablet   Other Relevant Orders   POCT Urinalysis Dipstick ([address]) (Completed)   Urine Culture       Return in 4 weeks (on 06/01/2023).   Total time spent: 30 minutes  Margaretann Loveless, MD  05/04/2023   This document may have been prepared by [facility] Voice Recognition software and as such may include unintentional dictation errors.

## 2023-05-05 NOTE — Progress Notes (Signed)
Established Patient Office Visit  Subjective:  Patient ID: Jini Horiuchi, female    DOB: 09/12/1957  Age: 57 y.o. MRN: [id number]  Chief Complaint  Patient presents with   Back Pain    Pain in back and shoulder    Patient in office with multiple complaints. Patient was in the ED on 04/01/23 for acute UTI. Patient back today wanting to know if her UTI is gone. Denies dysuria, fever, body aches, foul smell, or frequent voiding. Urinalysis today showed moderate leukocytes. Will order Bactrim and send urine for a culture.  Patient reports having "knots" on her arms and legs that were relieved with a "drip" while she was in the ED. Patient received a bag of fluids while in the ED.Recommend increasing fluid intake.  Also complaining of a cough, unable to clear her throat. Recommend loratadine and Muccinex.  Reviewed lab results. Patient admits she has not been taking a PKS9 inhibitor. Will reorder Repatha. Pre diabetic, continue diabetic diet and exercise.   Back Pain Pertinent negatives include no abdominal pain, chest pain, dysuria or headaches.    No other concerns at this time.   Past Medical History:  Diagnosis Date   Essential hypertension    HLD (hyperlipidemia)     Past Surgical History:  Procedure Laterality Date   CARDIAC CATHETERIZATION Right 05/27/2016   Procedure: Left Heart Cath and Coronary Angiography;  Surgeon: Laurier Nancy, MD;  Location: [facility] INVASIVE CV LAB;  Service: Cardiovascular;  Laterality: Right;   CARDIAC CATHETERIZATION N/A 05/27/2016   Procedure: Coronary Stent Intervention;  Surgeon: Alwyn Pea, MD;  Location: [facility] INVASIVE CV LAB;  Service: Cardiovascular;  Laterality: N/A;    Social History   Socioeconomic History   Marital status: Single    Spouse name: Not on file   Number of children: Not on file   Years of education: Not on file   Highest education level: Not on file  Occupational History   Not on file  Tobacco Use   Smoking status:  Never   Smokeless tobacco: Never  Substance and Sexual Activity   Alcohol use: No   Drug use: No   Sexual activity: Never  Other Topics Concern   Not on file  Social History Narrative   Not on file   Social Determinants of Health   Financial Resource Strain: Not on file  Food Insecurity: Not on file  Transportation Needs: Not on file  Physical Activity: Not on file  Stress: Not on file  Social Connections: Not on file  Intimate Partner Violence: Not on file    Family History  Problem Relation Age of Onset   Other Neg Hx     No Known Allergies  Review of Systems  Constitutional: Negative.   HENT: Negative.    Eyes: Negative.   Respiratory: Negative.  Negative for cough and shortness of breath.   Cardiovascular: Negative.  Negative for chest pain, palpitations and leg swelling.  Gastrointestinal: Negative.  Negative for abdominal pain, constipation, diarrhea, heartburn, nausea and vomiting.  Genitourinary: Negative.  Negative for dysuria and flank pain.  Musculoskeletal:  Positive for back pain. Negative for joint pain and myalgias.  Skin: Negative.   Neurological: Negative.  Negative for dizziness and headaches.  Endo/Heme/Allergies: Negative.   Psychiatric/Behavioral: Negative.  Negative for depression and suicidal ideas. The patient is not nervous/anxious.        Objective:   BP 138/88   Pulse 85   Ht 5' (1.524 m)   [employer]  134 lb 6.4 oz (61 kg)   SpO2 98%   BMI 26.25 kg/m   Vitals:   05/04/23 1023  BP: 138/88  Pulse: 85  Height: 5' (1.524 m)  Weight: 134 lb 6.4 oz (61 kg)  SpO2: 98%  BMI (Calculated): 26.25    Physical Exam Vitals and nursing note reviewed.  Constitutional:      Appearance: Normal appearance.  HENT:     Head: Normocephalic and atraumatic.     Nose: Nose normal.     Mouth/Throat:     Mouth: Mucous membranes are moist.     Pharynx: Oropharynx is clear.  Eyes:     Conjunctiva/sclera: Conjunctivae normal.     Pupils: Pupils are  equal, round, and reactive to light.  Cardiovascular:     Rate and Rhythm: Normal rate and regular rhythm.     Pulses: Normal pulses.     Heart sounds: Normal heart sounds. No murmur heard. Pulmonary:     Effort: Pulmonary effort is normal.     Breath sounds: Normal breath sounds. No wheezing.  Abdominal:     General: Bowel sounds are normal.     Palpations: Abdomen is soft.     Tenderness: There is no abdominal tenderness. There is no right CVA tenderness or left CVA tenderness.  Musculoskeletal:        General: Normal range of motion.     Cervical back: Normal range of motion.     Right lower leg: No edema.     Left lower leg: No edema.  Skin:    General: Skin is warm and dry.  Neurological:     General: No focal deficit present.     Mental Status: She is alert and oriented to person, place, and time.  Psychiatric:        Mood and Affect: Mood normal.        Behavior: Behavior normal.      Results for orders placed or performed in visit on 05/04/23  POCT Urinalysis Dipstick (81002)  Result Value Ref Range   Color, UA yellow    Clarity, UA cloudy    Glucose, UA Negative Negative   Bilirubin, UA neg    Ketones, UA neg    Spec Grav, UA 1.015 1.010 - 1.025   Blood, UA trace    pH, UA 7.5 5.0 - 8.0   Protein, UA Negative Negative   Urobilinogen, UA 0.2 0.2 or 1.0 E.U./dL   Nitrite, UA neg    Leukocytes, UA Moderate (2+) (A) Negative   Appearance cloudy    Odor yes     Recent Results (from the past 2160 hour(s))  Basic metabolic panel     Status: Abnormal   Collection Time: 04/01/23  6:11 PM  Result Value Ref Range   Sodium 133 (L) 135 - 145 mmol/L   Potassium 3.5 3.5 - 5.1 mmol/L   Chloride 100 98 - 111 mmol/L   CO2 16 (L) 22 - 32 mmol/L   Glucose, Bld 109 (H) 70 - 99 mg/dL    Comment: Glucose reference range applies only to samples taken after fasting for at least 8 hours.   BUN 30 (H) 8 - 23 mg/dL    Comment: QA FLAGS AND/OR RANGES MODIFIED BY DEMOGRAPHIC  UPDATE ON 06/06 AT 1934   Creatinine, Ser 1.48 (H) 0.44 - 1.00 mg/dL   Calcium 8.8 (L) 8.9 - 10.3 mg/dL   GFR, Estimated 41 (L) >60 mL/min    Comment: (NOTE) Calculated using  the CKD-EPI Creatinine Equation (2021)    Anion gap 17 (H) 5 - 15    Comment: Performed at [facility], [address] [place], [place] [address]  CBC     Status: Abnormal   Collection Time: 04/01/23  6:11 PM  Result Value Ref Range   WBC 10.2 4.0 - 10.5 K/uL   RBC 4.31 3.87 - 5.11 MIL/uL   Hemoglobin 11.5 (L) 12.0 - 15.0 g/dL   HCT 91.4 (L) 78.2 - 95.6 %   MCV 80.0 80.0 - 100.0 fL   MCH 26.7 26.0 - 34.0 pg   MCHC 33.3 30.0 - 36.0 g/dL   RDW 21.3 08.6 - 57.8 %   Platelets 225 150 - 400 K/uL   nRBC 0.0 0.0 - 0.2 %    Comment: Performed at [facility], [address], [place], [place] [address]  Troponin I (High Sensitivity)     Status: None   Collection Time: 04/01/23  6:11 PM  Result Value Ref Range   Troponin I (High Sensitivity) 12 <18 ng/L    Comment: (NOTE) Elevated high sensitivity troponin I (hsTnI) values and significant  changes across serial measurements may suggest ACS but many other  chronic and acute conditions are known to elevate hsTnI results.  Refer to the "Links" section for chest pain algorithms and additional  guidance. Performed at [facility], [address] [place], [place] [address]   SARS Coronavirus 2 by RT PCR (hospital order, performed in [facility] hospital lab) *cepheid single result test* Anterior Nasal Swab     Status: None   Collection Time: 04/01/23  6:12 PM   Specimen: Anterior Nasal Swab  Result Value Ref Range   SARS Coronavirus 2 by RT PCR NEGATIVE NEGATIVE    Comment: (NOTE) SARS-CoV-2 target nucleic acids are NOT DETECTED.  The SARS-CoV-2 RNA is generally detectable in upper and lower respiratory specimens during the acute phase of infection. The lowest concentration of SARS-CoV-2 viral copies this assay can detect is  250 copies / mL. A negative result does not preclude SARS-CoV-2 infection and should not be used as the sole basis for treatment or other patient management decisions.  A negative result may occur with improper specimen collection / handling, submission of specimen other than nasopharyngeal swab, presence of viral mutation(s) within the areas targeted by this assay, and inadequate number of viral copies (<250 copies / mL). A negative result must be combined with clinical observations, patient history, and epidemiological information.  Fact Sheet for Patients:   RoadLapTop.co.za  Fact Sheet for Healthcare Providers: http://kim-miller.com/  This test is not yet approved or  cleared by the [place] FDA and has been authorized for detection and/or diagnosis of SARS-CoV-2 by FDA under an Emergency Use Authorization (EUA).  This EUA will remain in effect (meaning this test can be used) for the duration of the COVID-19 declaration under Section 564(b)(1) of the Act, 21 U.S.C. section 360bbb-3(b)(1), unless the authorization is terminated or revoked sooner.  Performed at [facility], [address] [place], [place] [address]   Urinalysis, Routine w reflex microscopic -Urine, Clean Catch     Status: Abnormal   Collection Time: 04/01/23  7:18 PM  Result Value Ref Range   Color, Urine [place] (A) YELLOW    Comment: BIOCHEMICALS MAY BE AFFECTED BY COLOR   APPearance CLOUDY (A) CLEAR   Specific Gravity, Urine 1.011 1.005 - 1.030   pH 5.0 5.0 - 8.0   Glucose, UA  NEGATIVE NEGATIVE mg/dL   Hgb urine dipstick MODERATE (A) NEGATIVE   Bilirubin Urine NEGATIVE NEGATIVE   Ketones, ur 5 (A) NEGATIVE mg/dL   Protein, ur 161 (A) NEGATIVE mg/dL   Nitrite NEGATIVE NEGATIVE   Leukocytes,Ua LARGE (A) NEGATIVE   RBC / HPF 6-10 0 - 5 RBC/hpf   WBC, UA >50 0 - 5 WBC/hpf   Bacteria, UA MANY (A) NONE SEEN   Squamous Epithelial / HPF 21-50 0 - 5  /HPF   WBC Clumps PRESENT    Mucus PRESENT     Comment: Performed at [facility], [address] [place], [place] [address]  Lactic acid, plasma     Status: None   Collection Time: 04/01/23  7:57 PM  Result Value Ref Range   Lactic Acid, Venous 1.4 0.5 - 1.9 mmol/L    Comment: Performed at [facility], [address], [place], [place] [address]  Troponin I (High Sensitivity)     Status: None   Collection Time: 04/01/23  7:57 PM  Result Value Ref Range   Troponin I (High Sensitivity) 12 <18 ng/L    Comment: (NOTE) Elevated high sensitivity troponin I (hsTnI) values and significant  changes across serial measurements may suggest ACS but many other  chronic and acute conditions are known to elevate hsTnI results.  Refer to the "Links" section for chest pain algorithms and additional  guidance. Performed at [facility], [address] [place], [place] [address]   Hemoglobin A1c     Status: Abnormal   Collection Time: 04/13/23 10:12 AM  Result Value Ref Range   Hgb A1c MFr Bld 6.3 (H) 4.8 - 5.6 %    Comment:          Prediabetes: 5.7 - 6.4          Diabetes: >6.4          Glycemic control for adults with diabetes: <7.0    Est. average glucose Bld gHb Est-mCnc 134 mg/dL  TSH     Status: None   Collection Time: 04/13/23 10:12 AM  Result Value Ref Range   TSH 2.240 0.450 - 4.500 uIU/mL  CMP14+EGFR     Status: Abnormal   Collection Time: 04/13/23 10:12 AM  Result Value Ref Range   Glucose 96 70 - 99 mg/dL   BUN 15 8 - 27 mg/dL   Creatinine, Ser 7.82 (H) 0.57 - 1.00 mg/dL   eGFR 53 (L) >95 AO/ZHY/8.65   BUN/Creatinine Ratio 13 12 - 28   Sodium 143 134 - 144 mmol/L   Potassium 4.7 3.5 - 5.2 mmol/L   Chloride 104 96 - 106 mmol/L   CO2 22 20 - 29 mmol/L   Calcium 9.6 8.7 - 10.3 mg/dL   Total Protein 7.3 6.0 - 8.5 g/dL   Albumin 3.7 (L) 3.9 - 4.9 g/dL   Globulin, Total 3.6 1.5 - 4.5 g/dL   Bilirubin Total 0.3 0.0 - 1.2 mg/dL   Alkaline  Phosphatase 112 44 - 121 IU/L   AST 20 0 - 40 IU/L   ALT 21 0 - 32 IU/L  Lipid panel     Status: Abnormal   Collection Time: 04/13/23 10:12 AM  Result Value Ref Range   Cholesterol, Total 274 (H) 100 - 199 mg/dL   Triglycerides 784 (H) 0 - 149 mg/dL   HDL 35 (L) >69 mg/dL   VLDL Cholesterol Cal 38 5 - 40 mg/dL   LDL Chol Calc (NIH) 629 (H) 0 -  99 mg/dL   LDL CALC COMMENT: Comment     Comment: Consider evaluating for Familial Hypercholesterolemia(FH), if clinically indicated.    Chol/HDL Ratio 7.8 (H) 0.0 - 4.4 ratio    Comment:                                   T. Chol/HDL Ratio                                             Men  Women                               1/2 Avg.Risk  3.4    3.3                                   Avg.Risk  5.0    4.4                                2X Avg.Risk  9.6    7.1                                3X Avg.Risk 23.4   11.0   CBC With Differential     Status: Abnormal   Collection Time: 04/13/23 10:12 AM  Result Value Ref Range   WBC 5.3 3.4 - 10.8 x10E3/uL   RBC 3.87 3.77 - 5.28 x10E6/uL   Hemoglobin 10.2 (L) 11.1 - 15.9 g/dL   Hematocrit 60.4 (L) 54.0 - 46.6 %   MCV 83 79 - 97 fL   MCH 26.4 (L) 26.6 - 33.0 pg   MCHC 31.8 31.5 - 35.7 g/dL   RDW 98.1 19.1 - 47.8 %   Neutrophils 49 Not Estab. %   Lymphs 37 Not Estab. %   Monocytes 11 Not Estab. %   Eos 2 Not Estab. %   Basos 1 Not Estab. %   Neutrophils Absolute 2.6 1.4 - 7.0 x10E3/uL   Lymphocytes Absolute 2.0 0.7 - 3.1 x10E3/uL   Monocytes Absolute 0.6 0.1 - 0.9 x10E3/uL   EOS (ABSOLUTE) 0.1 0.0 - 0.4 x10E3/uL   Basophils Absolute 0.0 0.0 - 0.2 x10E3/uL   Immature Granulocytes 0 Not Estab. %   Immature Grans (Abs) 0.0 0.0 - 0.1 x10E3/uL    Comment: **Effective May 24, 2023, profile [id number] CBC/Differential**   (No Platelet) will be made non-orderable. [facility] Offers:   [id number] CBC With Differential/Platelet   POCT Urinalysis Dipstick (30865)     Status: Abnormal   Collection Time: 05/04/23  10:54 AM  Result Value Ref Range   Color, UA yellow    Clarity, UA cloudy    Glucose, UA Negative Negative   Bilirubin, UA neg    Ketones, UA neg    Spec Grav, UA 1.015 1.010 - 1.025   Blood, UA trace    pH, UA 7.5 5.0 - 8.0   Protein, UA Negative Negative   Urobilinogen, UA 0.2 0.2 or 1.0 E.U./dL   Nitrite, UA neg    Leukocytes, UA Moderate (2+) (A) Negative   Appearance  cloudy    Odor yes       Assessment & Plan:  UTI, RX for Bactrim sent to the pharmacy. Send urine for culture. Increase fluids . Recommend loratadine and Muxcinex for post nasal drip. Restart Repatha for elevated cholesterol. Continue diabetic diet and exercise for pre diabetes.  Problem List Items Addressed This Visit   None Visit Diagnoses     Urinary tract infection without hematuria, site unspecified    -  Primary   Relevant Medications   sulfamethoxazole-trimethoprim (BACTRIM DS) 800-160 MG tablet   Other Relevant Orders   POCT Urinalysis Dipstick (16109) (Completed)   Urine Culture       Return in 4 weeks (on 06/01/2023).   Total time spent: 30 minutes  Trapper Meech, NP  05/04/2023   This document may have been prepared by Dragon Voice Recognition software and as such may include unintentional dictation errors.

## 2023-05-07 ENCOUNTER — Other Ambulatory Visit: Payer: Self-pay | Admitting: Internal Medicine

## 2023-05-07 ENCOUNTER — Other Ambulatory Visit: Payer: Self-pay

## 2023-05-07 DIAGNOSIS — N390 Urinary tract infection, site not specified: Secondary | ICD-10-CM

## 2023-05-07 DIAGNOSIS — B9629 Other Escherichia coli [E. coli] as the cause of diseases classified elsewhere: Secondary | ICD-10-CM

## 2023-05-07 LAB — URINE CULTURE

## 2023-05-07 MED ORDER — NITROFURANTOIN MONOHYD MACRO 100 MG PO CAPS
100.0000 mg | ORAL_CAPSULE | Freq: Two times a day (BID) | ORAL | 0 refills | Status: AC
  Filled 2023-05-07: qty 14, 7d supply, fill #0

## 2023-05-13 ENCOUNTER — Other Ambulatory Visit: Payer: Self-pay

## 2023-06-01 ENCOUNTER — Ambulatory Visit: Payer: BC Managed Care – PPO | Admitting: Cardiology

## 2023-06-01 ENCOUNTER — Encounter: Payer: Self-pay | Admitting: Cardiology

## 2023-06-01 ENCOUNTER — Ambulatory Visit: Payer: BC Managed Care – PPO | Admitting: Nurse Practitioner

## 2023-06-01 VITALS — BP 148/92 | HR 100 | Ht 60.0 in | Wt 136.6 lb

## 2023-06-01 DIAGNOSIS — B9629 Other Escherichia coli [E. coli] as the cause of diseases classified elsewhere: Secondary | ICD-10-CM

## 2023-06-01 DIAGNOSIS — N390 Urinary tract infection, site not specified: Secondary | ICD-10-CM | POA: Diagnosis not present

## 2023-06-01 DIAGNOSIS — E785 Hyperlipidemia, unspecified: Secondary | ICD-10-CM | POA: Diagnosis not present

## 2023-06-01 DIAGNOSIS — R7303 Prediabetes: Secondary | ICD-10-CM | POA: Diagnosis not present

## 2023-06-01 DIAGNOSIS — I10 Essential (primary) hypertension: Secondary | ICD-10-CM | POA: Diagnosis not present

## 2023-06-01 DIAGNOSIS — Z1612 Extended spectrum beta lactamase (ESBL) resistance: Secondary | ICD-10-CM

## 2023-06-01 LAB — POCT URINALYSIS DIPSTICK
Bilirubin, UA: NEGATIVE
Glucose, UA: NEGATIVE
Ketones, UA: 1.02
Nitrite, UA: NEGATIVE
Protein, UA: POSITIVE — AB
Spec Grav, UA: 1.02 (ref 1.010–1.025)
Urobilinogen, UA: 0.2 E.U./dL
pH, UA: 6 (ref 5.0–8.0)

## 2023-06-01 MED ORDER — NITROFURANTOIN MONOHYD MACRO 100 MG PO CAPS: 100.0000 mg | ORAL_CAPSULE | Freq: Two times a day (BID) | ORAL | 0 refills | Status: AC

## 2023-06-01 MED ORDER — METOPROLOL TARTRATE 25 MG PO TABS: 25.0000 mg | ORAL_TABLET | Freq: Two times a day (BID) | ORAL | 2 refills | Status: DC

## 2023-06-01 MED ORDER — REPATHA 140 MG/ML ~~LOC~~ SOSY: 1.0000 mL | PREFILLED_SYRINGE | SUBCUTANEOUS | 6 refills | Status: DC

## 2023-06-01 NOTE — Progress Notes (Signed)
Established Patient Office Visit  Subjective:  Patient ID: Chelsey Wilson, female    DOB: 09/12/1957  Age: 57 y.o. MRN: [id number]  Chief Complaint  Patient presents with   Follow-up    4 WEEKS    Patient in office for 4 week follow up. Patient doing well. No complaints today. States she has been urinating frequently, could be from diabetes or recurrent UTI, will check a UA. Patient has not been taking her metoprolol or Repatha, states pharmacy does not have the prescriptions. Ne Refills sent. Pre diabetic, continue diabetic diet and exercise.     No other concerns at this time.   Past Medical History:  Diagnosis Date   Bacteremia GNR 08/03/2013   Essential hypertension    HLD (hyperlipidemia)    Hypokalemia 08/02/2013   Pyelonephritis 08/02/2013   Sepsis (HCC) 08/03/2013    Past Surgical History:  Procedure Laterality Date   CARDIAC CATHETERIZATION Right 05/27/2016   Procedure: Left Heart Cath and Coronary Angiography;  Surgeon: Laurier Nancy, MD;  Location: [facility];  Service: Cardiovascular;  Laterality: Right;   CARDIAC CATHETERIZATION N/A 05/27/2016   Procedure: Coronary Stent Intervention;  Surgeon: Alwyn Pea, MD;  Location: [facility] CV [facility];  Service: Cardiovascular;  Laterality: N/A;    Social History   Socioeconomic History   Marital status: Single    Spouse name: Not on file   Number of children: Not on file   Years of education: Not on file   Highest education level: Not on file  Occupational History   Not on file  Tobacco Use   Smoking status: Never   Smokeless tobacco: Never  Substance and Sexual Activity   Alcohol use: No   Drug use: No   Sexual activity: Never  Other Topics Concern   Not on file  Social History Narrative   Not on file   Social Determinants of Health   Financial Resource Strain: Not on file  Food Insecurity: Not on file  Transportation Needs: Not on file  Physical Activity: Not on file  Stress: Not on  file  Social Connections: Not on file  Intimate Partner Violence: Not on file    Family History  Problem Relation Age of Onset   Other Neg Hx     No Known Allergies  Review of Systems  Constitutional: Negative.   HENT: Negative.    Eyes: Negative.   Respiratory: Negative.  Negative for shortness of breath.   Cardiovascular: Negative.  Negative for chest pain.  Gastrointestinal: Negative.  Negative for abdominal pain, constipation and diarrhea.  Genitourinary:  Positive for frequency. Negative for dysuria, flank pain and urgency.  Musculoskeletal:  Negative for joint pain and myalgias.  Skin: Negative.   Neurological: Negative.  Negative for dizziness and headaches.  Endo/Heme/Allergies: Negative.   All other systems reviewed and are negative.      Objective:   BP (!) 148/92 (BP Location: Left Arm, Patient Position: Sitting, Cuff Size: Small)   Pulse 100   Ht 5' (1.524 m)   Wt 136 lb 9.6 oz (62 kg)   SpO2 99%   BMI 26.68 kg/m   Vitals:   06/01/23 1327 06/01/23 1351  BP: (!) 160/70 (!) 148/92  Pulse: 100   Height: 5' (1.524 m)   Weight: 136 lb 9.6 oz (62 kg)   SpO2: 99%   BMI (Calculated): 26.68     Physical Exam Vitals and nursing note reviewed.  Constitutional:      Appearance:  Normal appearance. She is normal weight.  HENT:     Head: Normocephalic and atraumatic.     Nose: Nose normal.     Mouth/Throat:     Mouth: Mucous membranes are moist.  Eyes:     Extraocular Movements: Extraocular movements intact.     Conjunctiva/sclera: Conjunctivae normal.     Pupils: Pupils are equal, round, and reactive to light.  Cardiovascular:     Rate and Rhythm: Normal rate and regular rhythm.     Pulses: Normal pulses.     Heart sounds: Normal heart sounds.  Pulmonary:     Effort: Pulmonary effort is normal.     Breath sounds: Normal breath sounds.  Abdominal:     General: Abdomen is flat. Bowel sounds are normal.     Palpations: Abdomen is soft.   Musculoskeletal:        General: Normal range of motion.     Cervical back: Normal range of motion.  Skin:    General: Skin is warm and dry.  Neurological:     General: No focal deficit present.     Mental Status: She is alert and oriented to person, place, and time.  Psychiatric:        Mood and Affect: Mood normal.        Behavior: Behavior normal.        Thought Content: Thought content normal.        Judgment: Judgment normal.      Results for orders placed or performed in visit on 06/01/23  POCT Urinalysis Dipstick (81002)  Result Value Ref Range   Color, UA     Clarity, UA     Glucose, UA Negative Negative   Bilirubin, UA negative    Ketones, UA 1.020    Spec Grav, UA 1.020 1.010 - 1.025   Blood, UA trace-intact    pH, UA 6.0 5.0 - 8.0   Protein, UA Positive (A) Negative   Urobilinogen, UA 0.2 0.2 or 1.0 E.U./dL   Nitrite, UA negative    Leukocytes, UA Large (3+) (A) Negative   Appearance     Odor      Recent Results (from the past 2160 hour(s))  Basic metabolic panel     Status: Abnormal   Collection Time: 04/01/23  6:11 PM  Result Value Ref Range   Sodium 133 (L) 135 - 145 mmol/L   Potassium 3.5 3.5 - 5.1 mmol/L   Chloride 100 98 - 111 mmol/L   CO2 16 (L) 22 - 32 mmol/L   Glucose, Bld 109 (H) 70 - 99 mg/dL    Comment: Glucose reference range applies only to samples taken after fasting for at least 8 hours.   BUN 30 (H) 8 - 23 mg/dL    Comment: QA FLAGS AND/OR RANGES MODIFIED BY DEMOGRAPHIC UPDATE ON 06/06 AT 1934   Creatinine, Ser 1.48 (H) 0.44 - 1.00 mg/dL   Calcium 8.8 (L) 8.9 - 10.3 mg/dL   GFR, Estimated 41 (L) >60 mL/min    Comment: (NOTE) Calculated using the CKD-EPI Creatinine Equation (2021)    Anion gap 17 (H) 5 - 15    Comment: Performed at [facility], [address] [place], [place] [address]  CBC     Status: Abnormal   Collection Time: 04/01/23  6:11 PM  Result Value Ref Range   WBC 10.2 4.0 - 10.5 K/uL   RBC 4.31 3.87 -  5.11 MIL/uL   Hemoglobin 11.5 (L) 12.0 - 15.0 g/dL  HCT 34.5 (L) 36.0 - 46.0 %   MCV 80.0 80.0 - 100.0 fL   MCH 26.7 26.0 - 34.0 pg   MCHC 33.3 30.0 - 36.0 g/dL   RDW 16.1 09.6 - 04.5 %   Platelets 225 150 - 400 K/uL   nRBC 0.0 0.0 - 0.2 %    Comment: Performed at [facility], [address], [place], [place] [address]  Troponin I (High Sensitivity)     Status: None   Collection Time: 04/01/23  6:11 PM  Result Value Ref Range   Troponin I (High Sensitivity) 12 <18 ng/L    Comment: (NOTE) Elevated high sensitivity troponin I (hsTnI) values and significant  changes across serial measurements may suggest ACS but many other  chronic and acute conditions are known to elevate hsTnI results.  Refer to the "Links" section for chest pain algorithms and additional  guidance. Performed at [facility], [address] [place], [place] [address]   SARS Coronavirus 2 by RT PCR (hospital order, performed in [facility] hospital lab) *cepheid single result test* Anterior Nasal Swab     Status: None   Collection Time: 04/01/23  6:12 PM   Specimen: Anterior Nasal Swab  Result Value Ref Range   SARS Coronavirus 2 by RT PCR NEGATIVE NEGATIVE    Comment: (NOTE) SARS-CoV-2 target nucleic acids are NOT DETECTED.  The SARS-CoV-2 RNA is generally detectable in upper and lower respiratory specimens during the acute phase of infection. The lowest concentration of SARS-CoV-2 viral copies this assay can detect is 250 copies / mL. A negative result does not preclude SARS-CoV-2 infection and should not be used as the sole basis for treatment or other patient management decisions.  A negative result may occur with improper specimen collection / handling, submission of specimen other than nasopharyngeal swab, presence of viral mutation(s) within the areas targeted by this assay, and inadequate number of viral copies (<250 copies / mL). A negative result must be combined with  clinical observations, patient history, and epidemiological information.  Fact Sheet for Patients:   RoadLapTop.co.za  Fact Sheet for Healthcare Providers: http://kim-miller.com/  This test is not yet approved or  cleared by the [place] FDA and has been authorized for detection and/or diagnosis of SARS-CoV-2 by FDA under an Emergency Use Authorization (EUA).  This EUA will remain in effect (meaning this test can be used) for the duration of the COVID-19 declaration under Section 564(b)(1) of the Act, 21 U.S.C. section 360bbb-3(b)(1), unless the authorization is terminated or revoked sooner.  Performed at [facility], [address] [place], [place] [address]   Urinalysis, Routine w reflex microscopic -Urine, Clean Catch     Status: Abnormal   Collection Time: 04/01/23  7:18 PM  Result Value Ref Range   Color, Urine Sebert Stollings (A) YELLOW    Comment: BIOCHEMICALS MAY BE AFFECTED BY COLOR   APPearance CLOUDY (A) CLEAR   Specific Gravity, Urine 1.011 1.005 - 1.030   pH 5.0 5.0 - 8.0   Glucose, UA NEGATIVE NEGATIVE mg/dL   Hgb urine dipstick MODERATE (A) NEGATIVE   Bilirubin Urine NEGATIVE NEGATIVE   Ketones, ur 5 (A) NEGATIVE mg/dL   Protein, ur 213 (A) NEGATIVE mg/dL   Nitrite NEGATIVE NEGATIVE   Leukocytes,Ua LARGE (A) NEGATIVE   RBC / HPF 6-10 0 - 5 RBC/hpf   WBC, UA >50 0 - 5 WBC/hpf   Bacteria, UA MANY (A) NONE SEEN   Squamous Epithelial / HPF 21-50 0 -  5 /HPF   WBC Clumps PRESENT    Mucus PRESENT     Comment: Performed at [facility], [address] [place], [place] [address]  Lactic acid, plasma     Status: None   Collection Time: 04/01/23  7:57 PM  Result Value Ref Range   Lactic Acid, Venous 1.4 0.5 - 1.9 mmol/L    Comment: Performed at [facility], [address] [place], [place] [address]  Troponin I (High Sensitivity)     Status: None   Collection Time: 04/01/23  7:57 PM  Result  Value Ref Range   Troponin I (High Sensitivity) 12 <18 ng/L    Comment: (NOTE) Elevated high sensitivity troponin I (hsTnI) values and significant  changes across serial measurements may suggest ACS but many other  chronic and acute conditions are known to elevate hsTnI results.  Refer to the "Links" section for chest pain algorithms and additional  guidance. Performed at [facility], [address] [place], [place] [address]   Hemoglobin A1c     Status: Abnormal   Collection Time: 04/13/23 10:12 AM  Result Value Ref Range   Hgb A1c MFr Bld 6.3 (H) 4.8 - 5.6 %    Comment:          Prediabetes: 5.7 - 6.4          Diabetes: >6.4          Glycemic control for adults with diabetes: <7.0    Est. average glucose Bld gHb Est-mCnc 134 mg/dL  TSH     Status: None   Collection Time: 04/13/23 10:12 AM  Result Value Ref Range   TSH 2.240 0.450 - 4.500 uIU/mL  CMP14+EGFR     Status: Abnormal   Collection Time: 04/13/23 10:12 AM  Result Value Ref Range   Glucose 96 70 - 99 mg/dL   BUN 15 8 - 27 mg/dL   Creatinine, Ser 1.30 (H) 0.57 - 1.00 mg/dL   eGFR 53 (L) >86 VH/QIO/9.62   BUN/Creatinine Ratio 13 12 - 28   Sodium 143 134 - 144 mmol/L   Potassium 4.7 3.5 - 5.2 mmol/L   Chloride 104 96 - 106 mmol/L   CO2 22 20 - 29 mmol/L   Calcium 9.6 8.7 - 10.3 mg/dL   Total Protein 7.3 6.0 - 8.5 g/dL   Albumin 3.7 (L) 3.9 - 4.9 g/dL   Globulin, Total 3.6 1.5 - 4.5 g/dL   Bilirubin Total 0.3 0.0 - 1.2 mg/dL   Alkaline Phosphatase 112 44 - 121 IU/L   AST 20 0 - 40 IU/L   ALT 21 0 - 32 IU/L  Lipid panel     Status: Abnormal   Collection Time: 04/13/23 10:12 AM  Result Value Ref Range   Cholesterol, Total 274 (H) 100 - 199 mg/dL   Triglycerides 952 (H) 0 - 149 mg/dL   HDL 35 (L) >84 mg/dL   VLDL Cholesterol Cal 38 5 - 40 mg/dL   LDL Chol Calc (NIH) 132 (H) 0 - 99 mg/dL   LDL CALC COMMENT: Comment     Comment: Consider evaluating for Familial Hypercholesterolemia(FH), if clinically  indicated.    Chol/HDL Ratio 7.8 (H) 0.0 - 4.4 ratio    Comment:                                   T. Chol/HDL Ratio  Men  Women                               1/2 Avg.Risk  3.4    3.3                                   Avg.Risk  5.0    4.4                                2X Avg.Risk  9.6    7.1                                3X Avg.Risk 23.4   11.0   CBC With Differential     Status: Abnormal   Collection Time: 04/13/23 10:12 AM  Result Value Ref Range   WBC 5.3 3.4 - 10.8 x10E3/uL   RBC 3.87 3.77 - 5.28 x10E6/uL   Hemoglobin 10.2 (L) 11.1 - 15.9 g/dL   Hematocrit 40.9 (L) 81.1 - 46.6 %   MCV 83 79 - 97 fL   MCH 26.4 (L) 26.6 - 33.0 pg   MCHC 31.8 31.5 - 35.7 g/dL   RDW 91.4 78.2 - 95.6 %   Neutrophils 49 Not Estab. %   Lymphs 37 Not Estab. %   Monocytes 11 Not Estab. %   Eos 2 Not Estab. %   Basos 1 Not Estab. %   Neutrophils Absolute 2.6 1.4 - 7.0 x10E3/uL   Lymphocytes Absolute 2.0 0.7 - 3.1 x10E3/uL   Monocytes Absolute 0.6 0.1 - 0.9 x10E3/uL   EOS (ABSOLUTE) 0.1 0.0 - 0.4 x10E3/uL   Basophils Absolute 0.0 0.0 - 0.2 x10E3/uL   Immature Granulocytes 0 Not Estab. %   Immature Grans (Abs) 0.0 0.0 - 0.1 x10E3/uL    Comment: **Effective May 24, 2023, profile 213086 CBC/Differential**   (No Platelet) will be made non-orderable. [facility] Offers:   [id number] CBC With Differential/Platelet   POCT Urinalysis Dipstick ([id number])     Status: Abnormal   Collection Time: 05/04/23 10:54 AM  Result Value Ref Range   Color, UA yellow    Clarity, UA cloudy    Glucose, UA Negative Negative   Bilirubin, UA neg    Ketones, UA neg    Spec Grav, UA 1.015 1.010 - 1.025   Blood, UA trace    pH, UA 7.5 5.0 - 8.0   Protein, UA Negative Negative   Urobilinogen, UA 0.2 0.2 or 1.0 E.U./dL   Nitrite, UA neg    Leukocytes, UA Moderate (2+) (A) Negative   Appearance cloudy    Odor yes   Urine Culture     Status: Abnormal   Collection Time: 05/04/23  2:18  PM   Specimen: Urine   UC  Result Value Ref Range   Urine Culture, Routine Final report (A)    Organism ID, Bacteria Comment (A)     Comment: Escherichia coli, identified by an automated biochemical system. Multi-Drug Resistant Organism Susceptibility profile is consistent with a probable ESBL. 50,000-100,000 colony forming units per mL    Antimicrobial Susceptibility Comment     Comment:       ** S = Susceptible; I = Intermediate; R = Resistant **  P = Positive; N = Negative             MICS are expressed in micrograms per mL    Antibiotic                 RSLT#1    RSLT#2    RSLT#3    RSLT#4 Amoxicillin/Clavulanic Acid    I Ampicillin                     R Cefazolin                      R Cefepime                       R Ceftriaxone                    R Cefuroxime                     R Ertapenem                      S Gentamicin                     R Imipenem                       S Levofloxacin                   I Meropenem                      S Nitrofurantoin                 S Piperacillin/Tazobactam        S Tetracycline                   R Tobramycin                     I Trimethoprim/Sulfa             R   POCT Urinalysis Dipstick ([id number])     Status: Abnormal   Collection Time: 06/01/23  2:02 PM  Result Value Ref Range   Color, UA     Clarity, UA     Glucose, UA Negative Negative   Bilirubin, UA negative    Ketones, UA 1.020    Spec Grav, UA 1.020 1.010 - 1.025   Blood, UA trace-intact    pH, UA 6.0 5.0 - 8.0   Protein, UA Positive (A) Negative   Urobilinogen, UA 0.2 0.2 or 1.0 E.U./dL   Nitrite, UA negative    Leukocytes, UA Large (3+) (A) Negative   Appearance     Odor        Assessment & Plan:  Pick metoprolol and Repatha up from the pharmacy. Continue diabetic diet and exercise.  Fasting blood work prior to next visit.   Problem List Items Addressed This Visit       Cardiovascular and Mediastinum   Essential hypertension,  benign - Primary   Relevant Medications   metoprolol tartrate (LOPRESSOR) 25 MG tablet   Evolocumab (REPATHA) 140 MG/ML SOSY   Other Relevant Orders   CMP14+EGFR   TSH   CBC with Diff     Other   Dyslipidemia   Relevant Medications   Evolocumab (REPATHA) 140 MG/ML SOSY  Other Relevant Orders   Lipid Profile   TSH   CBC with Diff   Pre-diabetes   Relevant Orders   CMP14+EGFR   Hemoglobin A1c   TSH   CBC with Diff   Other Visit Diagnoses     UTI due to extended-spectrum beta lactamase (ESBL) producing Escherichia coli       Relevant Medications   nitrofurantoin, macrocrystal-monohydrate, (MACROBID) 100 MG capsule   Other Relevant Orders   POCT Urinalysis Dipstick ([id number]) (Completed)   Culture, Urine       Return in about 2 months (around 08/01/2023).   Total time spent: 25 minutes  [employer], NP  06/01/2023   This document may have been prepared by Dragon Voice Recognition software and as such may include unintentional dictation errors.

## 2023-06-03 NOTE — Progress Notes (Signed)
Spoke with pt who verbalized understanding.

## 2023-06-07 ENCOUNTER — Telehealth: Payer: Self-pay | Admitting: Nurse Practitioner

## 2023-06-07 NOTE — Progress Notes (Signed)
Spoke with pt who verbalized understanding.

## 2023-06-07 NOTE — Telephone Encounter (Signed)
Patient left VM, did not state what she needs.

## 2023-08-02 ENCOUNTER — Ambulatory Visit: Payer: BC Managed Care – PPO | Admitting: Cardiology

## 2024-07-06 ENCOUNTER — Ambulatory Visit: Payer: Self-pay | Admitting: Cardiology

## 2024-11-08 ENCOUNTER — Other Ambulatory Visit: Payer: Self-pay

## 2024-11-08 ENCOUNTER — Ambulatory Visit: Payer: Self-pay | Admitting: Cardiology

## 2024-11-08 ENCOUNTER — Inpatient Hospital Stay
Admission: EM | Admit: 2024-11-08 | Discharge: 2024-11-12 | DRG: 321 | Disposition: A | Discharge status: Home / Self Care | Payer: Self-pay | Attending: Internal Medicine | Admitting: Internal Medicine

## 2024-11-08 ENCOUNTER — Encounter: Payer: Self-pay | Admitting: Cardiology

## 2024-11-08 ENCOUNTER — Emergency Department: Payer: Self-pay

## 2024-11-08 VITALS — BP 144/86 | HR 102 | Ht 60.0 in | Wt 135.0 lb

## 2024-11-08 DIAGNOSIS — G928 Other toxic encephalopathy: Secondary | ICD-10-CM | POA: Diagnosis present

## 2024-11-08 DIAGNOSIS — I4891 Unspecified atrial fibrillation: Principal | ICD-10-CM | POA: Diagnosis present

## 2024-11-08 DIAGNOSIS — R7401 Elevation of levels of liver transaminase levels: Secondary | ICD-10-CM

## 2024-11-08 DIAGNOSIS — M797 Fibromyalgia: Secondary | ICD-10-CM | POA: Diagnosis present

## 2024-11-08 DIAGNOSIS — I4719 Other supraventricular tachycardia: Secondary | ICD-10-CM | POA: Diagnosis present

## 2024-11-08 DIAGNOSIS — I471 Supraventricular tachycardia, unspecified: Secondary | ICD-10-CM | POA: Diagnosis present

## 2024-11-08 DIAGNOSIS — E872 Acidosis, unspecified: Secondary | ICD-10-CM | POA: Diagnosis present

## 2024-11-08 DIAGNOSIS — T40415A Adverse effect of fentanyl or fentanyl analogs, initial encounter: Secondary | ICD-10-CM | POA: Diagnosis present

## 2024-11-08 DIAGNOSIS — G9340 Encephalopathy, unspecified: Secondary | ICD-10-CM

## 2024-11-08 DIAGNOSIS — Z7982 Long term (current) use of aspirin: Secondary | ICD-10-CM

## 2024-11-08 DIAGNOSIS — I255 Ischemic cardiomyopathy: Secondary | ICD-10-CM | POA: Diagnosis present

## 2024-11-08 DIAGNOSIS — I252 Old myocardial infarction: Secondary | ICD-10-CM

## 2024-11-08 DIAGNOSIS — E785 Hyperlipidemia, unspecified: Secondary | ICD-10-CM

## 2024-11-08 DIAGNOSIS — T424X5A Adverse effect of benzodiazepines, initial encounter: Secondary | ICD-10-CM | POA: Diagnosis present

## 2024-11-08 DIAGNOSIS — I420 Dilated cardiomyopathy: Secondary | ICD-10-CM | POA: Diagnosis present

## 2024-11-08 DIAGNOSIS — Z79899 Other long term (current) drug therapy: Secondary | ICD-10-CM

## 2024-11-08 DIAGNOSIS — I10 Essential (primary) hypertension: Secondary | ICD-10-CM

## 2024-11-08 DIAGNOSIS — R002 Palpitations: Secondary | ICD-10-CM

## 2024-11-08 DIAGNOSIS — E162 Hypoglycemia, unspecified: Secondary | ICD-10-CM | POA: Diagnosis not present

## 2024-11-08 DIAGNOSIS — I5022 Chronic systolic (congestive) heart failure: Secondary | ICD-10-CM | POA: Diagnosis present

## 2024-11-08 DIAGNOSIS — N1831 Chronic kidney disease, stage 3a: Secondary | ICD-10-CM | POA: Diagnosis present

## 2024-11-08 DIAGNOSIS — R079 Chest pain, unspecified: Secondary | ICD-10-CM

## 2024-11-08 DIAGNOSIS — R739 Hyperglycemia, unspecified: Secondary | ICD-10-CM | POA: Diagnosis present

## 2024-11-08 DIAGNOSIS — I130 Hypertensive heart and chronic kidney disease with heart failure and stage 1 through stage 4 chronic kidney disease, or unspecified chronic kidney disease: Secondary | ICD-10-CM | POA: Diagnosis present

## 2024-11-08 DIAGNOSIS — I214 Non-ST elevation (NSTEMI) myocardial infarction: Principal | ICD-10-CM | POA: Diagnosis present

## 2024-11-08 DIAGNOSIS — I959 Hypotension, unspecified: Secondary | ICD-10-CM | POA: Diagnosis present

## 2024-11-08 DIAGNOSIS — I493 Ventricular premature depolarization: Secondary | ICD-10-CM | POA: Diagnosis present

## 2024-11-08 DIAGNOSIS — Z955 Presence of coronary angioplasty implant and graft: Secondary | ICD-10-CM

## 2024-11-08 DIAGNOSIS — I2510 Atherosclerotic heart disease of native coronary artery without angina pectoris: Secondary | ICD-10-CM | POA: Diagnosis present

## 2024-11-08 DIAGNOSIS — I700 Atherosclerosis of aorta: Secondary | ICD-10-CM | POA: Diagnosis present

## 2024-11-08 DIAGNOSIS — E8729 Other acidosis: Secondary | ICD-10-CM

## 2024-11-08 LAB — CBC WITH DIFFERENTIAL/PLATELET
Abs Immature Granulocytes: 0.04 K/uL (ref 0.00–0.07)
Basophils Absolute: 0 K/uL (ref 0.0–0.1)
Basophils Relative: 0 %
Eosinophils Absolute: 0 K/uL (ref 0.0–0.5)
Eosinophils Relative: 1 %
HCT: 34.3 % — ABNORMAL LOW (ref 36.0–46.0)
Hemoglobin: 11.2 g/dL — ABNORMAL LOW (ref 12.0–15.0)
Immature Granulocytes: 1 %
Lymphocytes Relative: 31 %
Lymphs Abs: 1.8 K/uL (ref 0.7–4.0)
MCH: 27.7 pg (ref 26.0–34.0)
MCHC: 32.7 g/dL (ref 30.0–36.0)
MCV: 84.7 fL (ref 80.0–100.0)
Monocytes Absolute: 0.2 K/uL (ref 0.1–1.0)
Monocytes Relative: 3 %
Neutro Abs: 3.8 K/uL (ref 1.7–7.7)
Neutrophils Relative %: 64 %
Platelets: 188 K/uL (ref 150–400)
RBC: 4.05 MIL/uL (ref 3.87–5.11)
RDW: 14.2 % (ref 11.5–15.5)
WBC: 5.9 K/uL (ref 4.0–10.5)
nRBC: 0 % (ref 0.0–0.2)

## 2024-11-08 LAB — CBG MONITORING, ED: Glucose-Capillary: 127 mg/dL — ABNORMAL HIGH (ref 70–99)

## 2024-11-08 MED ORDER — DILTIAZEM HCL 25 MG/5ML IV SOLN
INTRAVENOUS | Status: AC
  Filled 2024-11-08: qty 5

## 2024-11-08 MED ORDER — SODIUM CHLORIDE 0.9 % IV SOLN: INTRAVENOUS | Status: AC

## 2024-11-08 MED ORDER — LACTATED RINGERS IV BOLUS (SEPSIS)
1000.0000 mL | Freq: Once | INTRAVENOUS | Status: AC
  Administered 2024-11-09: 1000 mL via INTRAVENOUS

## 2024-11-08 MED ORDER — LACTATED RINGERS IV SOLN: INTRAVENOUS | Status: AC

## 2024-11-08 MED ORDER — ONDANSETRON HCL 4 MG/2ML IJ SOLN
INTRAMUSCULAR | Status: AC
  Administered 2024-11-08: 4 mg
  Filled 2024-11-08: qty 2

## 2024-11-08 MED ORDER — CARVEDILOL 3.125 MG PO TABS: 3.1250 mg | ORAL_TABLET | Freq: Two times a day (BID) | ORAL | 0 refills | Status: DC

## 2024-11-08 MED ORDER — FENTANYL CITRATE (PF) 50 MCG/ML IJ SOSY
PREFILLED_SYRINGE | INTRAMUSCULAR | Status: AC
  Administered 2024-11-08: 50 ug
  Filled 2024-11-08: qty 1

## 2024-11-08 MED ORDER — PANTOPRAZOLE SODIUM 40 MG PO TBEC: 40.0000 mg | DELAYED_RELEASE_TABLET | Freq: Every day | ORAL | 1 refills | Status: DC

## 2024-11-08 MED ORDER — REPATHA 140 MG/ML ~~LOC~~ SOSY: 1.0000 mL | PREFILLED_SYRINGE | SUBCUTANEOUS | 6 refills | Status: DC

## 2024-11-08 NOTE — ED Provider Notes (Signed)
 "  [facility] Provider Note    Event Date/Time   First MD Initiated Contact with Patient 11/08/24 2307     (approximate)   History   Chest Pain   HPI  Chelsey Wilson is a 59 y.o. female with history of CAD status post stent, hyperlipidemia, hypertension who presents to the emergency department with complaints of burning chest pain, shortness of breath.  She is unable to tell me how long this has been going on.  Patient presents with a heart rate in the 180s and blood pressure in the 70s.  She appears very pale in nature.  Daughter states that she called her tonight and daughter brought her to the ED.  She denies any recent fevers, cough, vomiting or diarrhea, bloody stools or melena.  She has never had a cardiac arrhythmia before.  Was seen by cardiology today and started on carvedilol .  Unclear if she has had a dose of this medication or not.  Denies any illicit drug use, alcohol use, stimulant use.   History provided by patient and daughter.    Past Medical History:  Diagnosis Date   Bacteremia GNR 08/03/2013   Essential hypertension    HLD (hyperlipidemia)    Hypokalemia 08/02/2013   Pyelonephritis 08/02/2013   Sepsis (HCC) 08/03/2013    Past Surgical History:  Procedure Laterality Date   CARDIAC CATHETERIZATION Right 05/27/2016   Procedure: Left Heart Cath and Coronary Angiography;  Surgeon: Denyse DELENA Bathe, MD;  Location: [facility] INVASIVE CV LAB;  Service: Cardiovascular;  Laterality: Right;   CARDIAC CATHETERIZATION N/A 05/27/2016   Procedure: Coronary Stent Intervention;  Surgeon: Cara JONETTA Lovelace, MD;  Location: [facility] INVASIVE CV LAB;  Service: Cardiovascular;  Laterality: N/A;    MEDICATIONS:  Prior to Admission medications  Medication Sig Start Date End Date Taking? Authorizing Provider  amitriptyline  (ELAVIL ) 50 MG tablet Take 1 tablet (50 mg total) by mouth at bedtime. Patient not taking: Reported on 11/08/2024 05/04/23   Bathe Fredy RAMAN, MD  aspirin   EC 81 MG EC tablet Take 1 tablet (81 mg total) by mouth daily. Patient not taking: Reported on 11/08/2024 05/28/16   Sherial Bail, MD  carvedilol  (COREG ) 3.125 MG tablet Take 1 tablet (3.125 mg total) by mouth 2 (two) times daily. 11/08/24 11/08/25  Scoggins, Amber, NP  Evolocumab  (REPATHA ) 140 MG/ML SOSY Inject 140 mg into the skin every 14 (fourteen) days. 11/08/24   Scoggins, Amber, NP  pantoprazole  (PROTONIX ) 40 MG tablet Take 1 tablet (40 mg total) by mouth daily. 11/08/24   Carin Gauze, NP    Physical Exam   Triage Vital Signs: ED Triage Vitals  Encounter Vitals Group     BP 11/08/24 2301 (!) 75/57     Girls Systolic BP Percentile --      Girls Diastolic BP Percentile --      Boys Systolic BP Percentile --      Boys Diastolic BP Percentile --      Pulse Rate 11/08/24 2301 (!) 182     Resp 11/08/24 2301 (!) 22     Temp 11/08/24 2301 97.8 F (36.6 C)     Temp Source 11/08/24 2338 Rectal     SpO2 11/08/24 2301 98 %     Weight 11/08/24 2257 134 lb 14.7 oz (61.2 kg)     Height 11/08/24 2257 5' (1.524 m)     Head Circumference --      Peak Flow --      Pain Score 11/08/24  2257 10     Pain Loc --      Pain Education --      Exclude from Growth Chart --     Most recent vital signs: Vitals:   11/09/24 0821 11/09/24 0822  BP:    Pulse: 77 81  Resp: 15 20  Temp:    SpO2: 97% 100%    CONSTITUTIONAL: Alert, able to answer some questions but appears confused, pale in appearance HEAD: Normocephalic, atraumatic EYES: Conjunctivae clear, pupils appear equal, sclera nonicteric ENT: normal nose; moist mucous membranes NECK: Supple, normal ROM CARD: Regular and tachycardic; S1 and S2 appreciated RESP: Normal chest excursion without splinting or tachypnea; breath sounds clear and equal bilaterally; no wheezes, no rhonchi, no rales, no hypoxia or respiratory distress, speaking full sentences ABD/GI: Non-distended; soft, non-tender, no rebound, no guarding, no peritoneal  signs BACK: The back appears normal EXT: Normal ROM in all joints; no deformity noted, no edema, no calf tenderness or calf swelling SKIN: Normal color for age and race; extremities extremely cool to touch NEURO: Moves all extremities equally, normal speech PSYCH: The patient's mood and manner are appropriate.   ED Results / Procedures / Treatments   LABS: (all labs ordered are listed, but only abnormal results are displayed) Labs Reviewed  CBC WITH DIFFERENTIAL/PLATELET - Abnormal; Notable for the following components:      Result Value   Hemoglobin 11.2 (*)    HCT 34.3 (*)    All other components within normal limits  COMPREHENSIVE METABOLIC PANEL WITH GFR - Abnormal; Notable for the following components:   CO2 17 (*)    Glucose, Bld 214 (*)    Creatinine, Ser 1.32 (*)    Calcium  8.6 (*)    AST 305 (*)    ALT 268 (*)    GFR, Estimated 46 (*)    Anion gap 18 (*)    All other components within normal limits  LACTIC ACID, PLASMA - Abnormal; Notable for the following components:   Lactic Acid, Venous 3.1 (*)    All other components within normal limits  PRO BRAIN NATRIURETIC PEPTIDE - Abnormal; Notable for the following components:   Pro Brain Natriuretic Peptide 2,017.0 (*)    All other components within normal limits  D-DIMER, QUANTITATIVE - Abnormal; Notable for the following components:   D-Dimer, Quant 1.16 (*)    All other components within normal limits  URINALYSIS, W/ REFLEX TO CULTURE (INFECTION SUSPECTED) - Abnormal; Notable for the following components:   Color, Urine STRAW (*)    APPearance CLEAR (*)    All other components within normal limits  URINE DRUG SCREEN - Abnormal; Notable for the following components:   Fentanyl  POSITIVE (*)    All other components within normal limits  COMPREHENSIVE METABOLIC PANEL WITH GFR - Abnormal; Notable for the following components:   Potassium 3.3 (*)    CO2 20 (*)    Calcium  8.5 (*)    Total Protein 6.3 (*)    AST 322  (*)    ALT 240 (*)    All other components within normal limits  CBC - Abnormal; Notable for the following components:   RBC 3.72 (*)    Hemoglobin 10.5 (*)    HCT 31.1 (*)    All other components within normal limits  CBG MONITORING, ED - Abnormal; Notable for the following components:   Glucose-Capillary 127 (*)    All other components within normal limits  TROPONIN T, HIGH SENSITIVITY - Abnormal; Notable  for the following components:   Troponin T High Sensitivity 40 (*)    All other components within normal limits  TROPONIN T, HIGH SENSITIVITY - Abnormal; Notable for the following components:   Troponin T High Sensitivity 703 (*)    All other components within normal limits  RESP PANEL BY RT-PCR (RSV, FLU A&B, COVID)  RVPGX2  CULTURE, BLOOD (ROUTINE X 2)  CULTURE, BLOOD (ROUTINE X 2)  PROTIME-INR  PROCALCITONIN  TSH  T4, FREE  MAGNESIUM   ETHANOL  LACTIC ACID, PLASMA  HIV ANTIBODY (ROUTINE TESTING W REFLEX)  HEMOGLOBIN A1C  LACTIC ACID, PLASMA  LACTIC ACID, PLASMA  TROPONIN T, HIGH SENSITIVITY     EKG:  EKG Interpretation Date/Time:  Wednesday November 08 2024 23:00:46 EST Ventricular Rate:  182 PR Interval:  100 QRS Duration:  86 QT Interval:  262 QTC Calculation: 455 R Axis:   89  Text Interpretation: Sinus tachycardia with short PR Left ventricular hypertrophy with repolarization abnormality ( Sokolow-Lyon , Romhilt-Estes ) Abnormal ECG When compared with ECG of 01-Apr-2023 18:22, PR interval has decreased ST now depressed in Inferior leads ST now depressed in Anterior leads T wave inversion more evident in Inferior leads T wave inversion now evident in Anterolateral leads Confirmed by Neomi Neptune [phone]) on 11/08/2024 11:08:18 PM                  RADIOLOGY: My personal review and interpretation of imaging: CTA chest shows no PE.  I have personally reviewed all radiology reports.   CT Angio Chest PE W and/or Wo Contrast Result Date:  11/09/2024 EXAM: CTA CHEST 11/09/2024 01:36:21 AM TECHNIQUE: CTA of the chest was performed after the administration of intravenous contrast. Multiplanar reformatted images are provided for review. MIP images are provided for review. Automated exposure control, iterative reconstruction, and/or weight based adjustment of the mA/kV was utilized to reduce the radiation dose to as low as reasonably achievable. COMPARISON: None available. CLINICAL HISTORY: Pulmonary embolism (PE) high probability. Chest pain, dyspnea. FINDINGS: PULMONARY ARTERIES: Pulmonary arteries are adequately opacified for evaluation. No acute pulmonary embolus. Main pulmonary artery is normal in caliber. MEDIASTINUM: Extensive multivessel coronary artery calcification. Global cardiac size is within normal limits though left ventricular dilation is noted. Mild atherosclerotic calcification within the thoracic aorta. LYMPH NODES: No mediastinal, hilar or axillary lymphadenopathy. LUNGS AND PLEURA: The lungs are without acute process. No focal consolidation or pulmonary edema. No evidence of pleural effusion or pneumothorax. UPPER ABDOMEN: Small hiatal hernia. SOFT TISSUES AND BONES: No acute bone or soft tissue abnormality. RAF SCORE: Aortic atherosclerosis (ICD-10: I70.0). No pulmonary embolism. IMPRESSION: 1. No pulmonary embolism. 2. Extensive multivessel coronary artery calcification and mild atherosclerotic calcification of the thoracic aorta. 3. Left ventricular dilation. 4. Small hiatal hernia. 5. RAF score includes aortic atherosclerosis (ICD10-I70.0) Electronically signed by: Dorethia Molt MD 11/09/2024 01:45 AM EST RP Workstation: [id number]   DG Chest 1 View Result Date: 11/08/2024 EXAM: 2 VIEW(S) XRAY OF THE CHEST 11/08/2024 11:29:00 PM COMPARISON: 04/01/2023 CLINICAL HISTORY: Chest pain. Pt reported chest pain shortness of breath that began today. Pt denies cough or congestion. Pts only cardiac hx HTN. FINDINGS: LINES, TUBES AND  DEVICES: Pacer pads on left chest. LUNGS AND PLEURA: No focal pulmonary opacity. No pleural effusion. No pneumothorax. HEART AND MEDIASTINUM: No acute abnormality of the cardiac and mediastinal silhouettes. BONES AND SOFT TISSUES: No acute osseous abnormality. IMPRESSION: 1. No acute cardiopulmonary process. Electronically signed by: Morgane Naveau MD 11/08/2024 11:32 PM EST RP Workstation: [id number]  PROCEDURES:  Critical Care performed: Yes, see critical care procedure note(s)   CRITICAL CARE Performed by: Josette Sink   Total critical care time: 30 minutes  Critical care time was exclusive of separately billable procedures and treating other patients.  Critical care was necessary to treat or prevent imminent or life-threatening deterioration.  Critical care was time spent personally by me on the following activities: development of treatment plan with patient and/or surrogate as well as nursing, discussions with consultants, evaluation of patient's response to treatment, examination of patient, obtaining history from patient or surrogate, ordering and performing treatments and interventions, ordering and review of laboratory studies, ordering and review of radiographic studies, pulse oximetry and re-evaluation of patient's condition.   .Cardioversion  Date/Time: 11/08/2024 11:30 PM  Performed by: Dymir Neeson, Josette SAILOR, DO Authorized by: Sha Burling, Josette SAILOR, DO   Consent:    Consent obtained:  Emergent situation   Consent given by:  Patient   Risks discussed:  Cutaneous burn, death, induced arrhythmia and pain Pre-procedure details:    Cardioversion basis:  Emergent   Rhythm:  Supraventricular tachycardia   Electrode placement:  Anterior-posterior Patient sedated: No Attempt one:    Cardioversion mode:  Synchronous   Shock (Joules):  120   Shock outcome:  Conversion to normal sinus rhythm     IMPRESSION / MDM / ASSESSMENT AND PLAN / ED COURSE  I reviewed the triage vital  signs and the nursing notes.    Patient here with SVT versus A-fib with RVR.  She is not mentating well and is hypotensive with cool extremities.  The patient is on the cardiac monitor to evaluate for evidence of arrhythmia and/or significant heart rate changes.   DIFFERENTIAL DIAGNOSIS (includes but not limited to):   SVT, A-fib with RVR, anemia, electrolyte derangement, ACS, PE, dehydration, thyroid dysfunction   Patient's presentation is most consistent with acute presentation with potential threat to life or bodily function.   PLAN: Patient here critically ill with pale appearance, hypotension, cool extremities.  I am concerned that patient is not perfusing well due to her abnormal cardiac rhythm.  Discussed this with patient and daughter at bedside and my concern that sedation could be quite dangerous given her hypotension and that I do not feel antiarrhythmics will work fast enough.  We discussed proceeding with electrocardioversion and they both agree.  Patient cardioverted using 120 J synchronized cardioversion and then immediately went into a sinus rhythm.  Still had occasional PVCs.  Patient's color immediately improved, her extremities became warm to touch and she states her chest pain and shortness of breath have resolved.  Will obtain labs, urine, chest x-ray.  Have recommended admission for observation.  Will continue IV hydration.   MEDICATIONS GIVEN IN ED: Medications  lactated ringers  infusion ( Intravenous New Bag/Given 11/09/24 0757)  aspirin  EC tablet 81 mg (has no administration in time range)  carvedilol  (COREG ) tablet 3.125 mg (has no administration in time range)  enoxaparin  (LOVENOX ) injection 40 mg (40 mg Subcutaneous Given 11/09/24 0617)  acetaminophen  (TYLENOL ) tablet 650 mg (has no administration in time range)    Or  acetaminophen  (TYLENOL ) suppository 650 mg (has no administration in time range)  ondansetron  (ZOFRAN ) tablet 4 mg (has no administration in  time range)    Or  ondansetron  (ZOFRAN ) injection 4 mg (has no administration in time range)  HYDROcodone -acetaminophen  (NORCO/VICODIN) 5-325 MG per tablet 1-2 tablet (has no administration in time range)  morphine  (PF) 2 MG/ML injection 2 mg (has no administration  in time range)  ondansetron  (ZOFRAN ) 4 MG/2ML injection (4 mg  Given 11/08/24 2319)  fentaNYL  (SUBLIMAZE ) 50 MCG/ML injection (50 mcg  Given 11/08/24 2320)  0.9 %  sodium chloride  infusion (0 mLs Intravenous Stopped 11/09/24 0538)  lactated ringers  bolus 1,000 mL (0 mLs Intravenous Stopped 11/09/24 0538)    And  lactated ringers  bolus 1,000 mL (0 mLs Intravenous Stopped 11/09/24 0638)  iohexol  (OMNIPAQUE ) 350 MG/ML injection 75 mL (75 mLs Intravenous Contrast Given 11/09/24 0126)  potassium chloride  SA (KLOR-CON  M) CR tablet 40 mEq (40 mEq Oral Given 11/09/24 9178)     ED COURSE: Patient's labs show mild anemia.  Hemoglobin of 11.2.  Potassium of 4.7.  Normal magnesium .  Mildly elevated creatinine.  Troponin slightly elevated likely from demand ischemia.  Will continue to monitor.  No signs or symptoms of sepsis today.  She does have elevated liver function test but no upper abdominal tenderness second exam.  She denies any history of alcohol use.  May need nonemergent ultrasound.  Total bilirubin is normal.  Chest x-ray reviewed and interpreted by myself and the radiologist and is clear.  TSH normal.  D-dimer was elevated so CTA of the chest was obtained and reviewed and interpreted by myself and the radiologist and shows no PE or edema.  Will discuss with medicine for admission.   CONSULTS:  Consulted and discussed patient's case with hospitalist, Dr. Cleatus.  I have recommended admission and consulting physician agrees and will place admission orders.  Patient (and family if present) agree with this plan.   I reviewed all nursing notes, vitals, pertinent previous records.  All labs, EKGs, imaging ordered have been independently reviewed  and interpreted by myself.    OUTSIDE RECORDS REVIEWED: Reviewed recent cardiology notes.       FINAL CLINICAL IMPRESSION(S) / ED DIAGNOSES   Final diagnoses:  Atrial fibrillation with RVR (HCC)  Hypotension, unspecified hypotension type  Chest pain, unspecified type     Rx / DC Orders   ED Discharge Orders     None        Note:  This document was prepared using Dragon voice recognition software and may include unintentional dictation errors.   Tanielle Emigh, Josette SAILOR, DO 11/09/24 0830  "

## 2024-11-08 NOTE — ED Triage Notes (Signed)
 Pt reported chest pain shortness of breath that began today. Pt denies cough or congestion. Pts only cardiac hx HTN.

## 2024-11-08 NOTE — Progress Notes (Signed)
 "  Established Patient Office Visit  Subjective:  Patient ID: Chelsey Wilson, female    DOB: 1966-03-30  Age: 59 y.o. MRN: [id number]  Chief Complaint  Patient presents with   Dizziness   Nausea   Palpitations    Patient in office for an acute visit. Patient complaining of dizziness, palpitations, and nausea. Patient has not been taking any medication. EKG today, sinus rhythm with PVCs, LVH. Echo 2017 EF 45%, mod. MR, grade I DD. LHC 2017 successful PCI and stent to mid circumflex with DES. Patient has not followed up with cardiology since then. Will refer to cardiology. Will send in pantoprazole  for acid reflux. Patient states metroprolol increases her heart rate, will send in low dose carvedilol .  Patient has not been taking her Repatha , will refill today.   Dizziness This is a new problem. The current episode started today. Associated symptoms include nausea. Pertinent negatives include no abdominal pain, chest pain, headaches or myalgias.  Palpitations  Associated symptoms include dizziness and nausea. Pertinent negatives include no chest pain or shortness of breath.    No other concerns at this time.   Past Medical History:  Diagnosis Date   Bacteremia GNR 08/03/2013   Essential hypertension    HLD (hyperlipidemia)    Hypokalemia 08/02/2013   Pyelonephritis 08/02/2013   Sepsis (HCC) 08/03/2013    Past Surgical History:  Procedure Laterality Date   CARDIAC CATHETERIZATION Right 05/27/2016   Procedure: Left Heart Cath and Coronary Angiography;  Surgeon: Denyse DELENA Bathe, MD;  Location: [facility] INVASIVE CV LAB;  Service: Cardiovascular;  Laterality: Right;   CARDIAC CATHETERIZATION N/A 05/27/2016   Procedure: Coronary Stent Intervention;  Surgeon: Cara JONETTA Lovelace, MD;  Location: [facility] INVASIVE CV LAB;  Service: Cardiovascular;  Laterality: N/A;    Social History   Socioeconomic History   Marital status: Single    Spouse name: Not on file   Number of children: Not on file    Years of education: Not on file   Highest education level: Not on file  Occupational History   Not on file  Tobacco Use   Smoking status: Never   Smokeless tobacco: Never  Substance and Sexual Activity   Alcohol use: No   Drug use: No   Sexual activity: Never  Other Topics Concern   Not on file  Social History Narrative   Not on file   Social Drivers of Health   Tobacco Use: Low Risk (11/08/2024)   Patient History    Smoking Tobacco Use: Never    Smokeless Tobacco Use: Never    Passive Exposure: Not on file  Financial Resource Strain: Not on file  Food Insecurity: Not on file  Transportation Needs: Not on file  Physical Activity: Not on file  Stress: Not on file  Social Connections: Not on file  Intimate Partner Violence: Not on file  Depression (EYV7-0): Not on file  Alcohol Screen: Not on file  Housing: Not on file  Utilities: Not on file  Health Literacy: Not on file    Family History  Problem Relation Age of Onset   Other Neg Hx     Allergies[1]  Show/hide medication list[2]  Review of Systems  Constitutional: Negative.   HENT: Negative.    Eyes: Negative.   Respiratory: Negative.  Negative for shortness of breath.   Cardiovascular:  Positive for palpitations. Negative for chest pain.  Gastrointestinal:  Positive for nausea. Negative for abdominal pain, constipation and diarrhea.  Genitourinary: Negative.   Musculoskeletal:  Negative  for joint pain and myalgias.  Skin: Negative.   Neurological:  Positive for dizziness. Negative for headaches.  Endo/Heme/Allergies: Negative.   All other systems reviewed and are negative.      Objective:   BP (!) 144/86   Pulse (!) 102   Ht 5' (1.524 m)   Wt 135 lb (61.2 kg)   SpO2 98%   BMI 26.37 kg/m   Vitals:   11/08/24 0910  BP: (!) 144/86  Pulse: (!) 102  Height: 5' (1.524 m)  Weight: 135 lb (61.2 kg)  SpO2: 98%  BMI (Calculated): 26.37    Physical Exam Vitals and nursing note reviewed.   Constitutional:      Appearance: Normal appearance. She is normal weight.  HENT:     Head: Normocephalic and atraumatic.     Nose: Nose normal.     Mouth/Throat:     Mouth: Mucous membranes are moist.  Eyes:     Extraocular Movements: Extraocular movements intact.     Conjunctiva/sclera: Conjunctivae normal.     Pupils: Pupils are equal, round, and reactive to light.  Cardiovascular:     Rate and Rhythm: Tachycardia present. Rhythm irregular.     Pulses: Normal pulses.     Heart sounds: Normal heart sounds.  Pulmonary:     Effort: Pulmonary effort is normal.     Breath sounds: Normal breath sounds.  Abdominal:     General: Abdomen is flat. Bowel sounds are normal.     Palpations: Abdomen is soft.  Musculoskeletal:        General: Normal range of motion.     Cervical back: Normal range of motion.  Skin:    General: Skin is warm and dry.  Neurological:     General: No focal deficit present.     Mental Status: She is alert and oriented to person, place, and time.  Psychiatric:        Mood and Affect: Mood normal.        Behavior: Behavior normal.        Thought Content: Thought content normal.        Judgment: Judgment normal.      No results found for any visits on 11/08/24.  No results found for this or any previous visit (from the past 2160 hours).    Assessment & Plan:  Referral to cardiology Carvedilol  Pantoprazole  Repatha   Problem List Items Addressed This Visit       Cardiovascular and Mediastinum   Essential hypertension, benign   Relevant Medications   carvedilol  (COREG ) 3.125 MG tablet   Evolocumab  (REPATHA ) 140 MG/ML SOSY     Other   Dyslipidemia   Relevant Medications   Evolocumab  (REPATHA ) 140 MG/ML SOSY   Palpitation - Primary   Relevant Orders   Ambulatory referral to Cardiology    Return in about 4 weeks (around 12/06/2024).   Total time spent: 25 minutes. This time includes review of previous notes and results and patient face to  face interaction during today's visit.    Jeoffrey Pollen, NP  11/08/2024   This document may have been prepared by [facility] Voice Recognition software and as such may include unintentional dictation errors.     [1]  Allergies Allergen Reactions   Metoprolol  Palpitations  [2]  Outpatient Medications Prior to Visit  Medication Sig   amitriptyline  (ELAVIL ) 50 MG tablet Take 1 tablet (50 mg total) by mouth at bedtime. (Patient not taking: Reported on 11/08/2024)   aspirin  EC 81 MG EC  tablet Take 1 tablet (81 mg total) by mouth daily. (Patient not taking: Reported on 11/08/2024)   [DISCONTINUED] Evolocumab  (REPATHA ) 140 MG/ML SOSY Inject 140 mg into the skin every 14 (fourteen) days. (Patient not taking: Reported on 11/08/2024)   [DISCONTINUED] metoprolol  tartrate (LOPRESSOR ) 25 MG tablet Take 1 tablet (25 mg total) by mouth 2 (two) times daily. (Patient not taking: Reported on 11/08/2024)   No facility-administered medications prior to visit.   "

## 2024-11-08 NOTE — ED Notes (Addendum)
 Cardioversion at 120J - shock delivered and heart rhythm converted from 180s to 80s

## 2024-11-08 NOTE — ED Notes (Signed)
 Called lab to add on additional bloodwork ordered.

## 2024-11-09 ENCOUNTER — Observation Stay: Payer: Self-pay

## 2024-11-09 ENCOUNTER — Emergency Department: Payer: Self-pay

## 2024-11-09 ENCOUNTER — Other Ambulatory Visit: Payer: Self-pay

## 2024-11-09 ENCOUNTER — Observation Stay: Admit: 2024-11-09 | Discharge: 2024-11-09 | Disposition: A | Payer: Self-pay | Attending: Internal Medicine

## 2024-11-09 ENCOUNTER — Encounter: Payer: Self-pay | Admitting: Cardiovascular Disease

## 2024-11-09 DIAGNOSIS — E8729 Other acidosis: Secondary | ICD-10-CM

## 2024-11-09 DIAGNOSIS — R739 Hyperglycemia, unspecified: Secondary | ICD-10-CM

## 2024-11-09 DIAGNOSIS — N1831 Chronic kidney disease, stage 3a: Secondary | ICD-10-CM

## 2024-11-09 DIAGNOSIS — I4719 Other supraventricular tachycardia: Secondary | ICD-10-CM | POA: Diagnosis present

## 2024-11-09 DIAGNOSIS — R7401 Elevation of levels of liver transaminase levels: Secondary | ICD-10-CM

## 2024-11-09 DIAGNOSIS — I2510 Atherosclerotic heart disease of native coronary artery without angina pectoris: Secondary | ICD-10-CM

## 2024-11-09 LAB — ECHOCARDIOGRAM COMPLETE
AR max vel: 2.72 cm2
AV Area VTI: 3.06 cm2
AV Area mean vel: 3.11 cm2
AV Mean grad: 1 mmHg
AV Peak grad: 3 mmHg
Ao pk vel: 0.87 m/s
Area-P 1/2: 5.09 cm2
Calc EF: 17.7 %
Height: 60 in
MV M vel: 5.73 m/s
MV Peak grad: 131.3 mmHg
MV VTI: 1.67 cm2
Radius: 1 cm
S' Lateral: 5.1 cm
Single Plane A2C EF: 24.6 %
Single Plane A4C EF: 13 %
Weight: 2158.74 [oz_av]

## 2024-11-09 LAB — URINALYSIS, W/ REFLEX TO CULTURE (INFECTION SUSPECTED)
Bacteria, UA: NONE SEEN
Bilirubin Urine: NEGATIVE
Glucose, UA: NEGATIVE mg/dL
Hgb urine dipstick: NEGATIVE
Ketones, ur: NEGATIVE mg/dL
Leukocytes,Ua: NEGATIVE
Nitrite: NEGATIVE
Protein, ur: NEGATIVE mg/dL
RBC / HPF: 0 RBC/hpf (ref 0–5)
Specific Gravity, Urine: 1.018 (ref 1.005–1.030)
pH: 7 (ref 5.0–8.0)

## 2024-11-09 LAB — COMPREHENSIVE METABOLIC PANEL WITH GFR
ALT: 240 U/L — ABNORMAL HIGH (ref 0–44)
ALT: 268 U/L — ABNORMAL HIGH (ref 0–44)
AST: 305 U/L — ABNORMAL HIGH (ref 15–41)
AST: 322 U/L — ABNORMAL HIGH (ref 15–41)
Albumin: 3.7 g/dL (ref 3.5–5.0)
Albumin: 4 g/dL (ref 3.5–5.0)
Alkaline Phosphatase: 101 U/L (ref 38–126)
Alkaline Phosphatase: 86 U/L (ref 38–126)
Anion gap: 14 (ref 5–15)
Anion gap: 18 — ABNORMAL HIGH (ref 5–15)
BUN: 17 mg/dL (ref 6–20)
BUN: 20 mg/dL (ref 6–20)
CO2: 17 mmol/L — ABNORMAL LOW (ref 22–32)
CO2: 20 mmol/L — ABNORMAL LOW (ref 22–32)
Calcium: 8.5 mg/dL — ABNORMAL LOW (ref 8.9–10.3)
Calcium: 8.6 mg/dL — ABNORMAL LOW (ref 8.9–10.3)
Chloride: 105 mmol/L (ref 98–111)
Chloride: 109 mmol/L (ref 98–111)
Creatinine, Ser: 0.94 mg/dL (ref 0.44–1.00)
Creatinine, Ser: 1.32 mg/dL — ABNORMAL HIGH (ref 0.44–1.00)
GFR, Estimated: 46 mL/min — ABNORMAL LOW
GFR, Estimated: >60 mL/min
Glucose, Bld: 214 mg/dL — ABNORMAL HIGH (ref 70–99)
Glucose, Bld: 72 mg/dL (ref 70–99)
Potassium: 3.3 mmol/L — ABNORMAL LOW (ref 3.5–5.1)
Potassium: 4.7 mmol/L (ref 3.5–5.1)
Sodium: 140 mmol/L (ref 135–145)
Sodium: 143 mmol/L (ref 135–145)
Total Bilirubin: 0.4 mg/dL (ref 0.0–1.2)
Total Bilirubin: 0.7 mg/dL (ref 0.0–1.2)
Total Protein: 6.3 g/dL — ABNORMAL LOW (ref 6.5–8.1)
Total Protein: 6.9 g/dL (ref 6.5–8.1)

## 2024-11-09 LAB — PRO BRAIN NATRIURETIC PEPTIDE: Pro Brain Natriuretic Peptide: 2017 pg/mL — ABNORMAL HIGH

## 2024-11-09 LAB — LACTIC ACID, PLASMA
Lactic Acid, Venous: 3.1 mmol/L (ref 0.5–1.9)
Lactic Acid, Venous: 2.6 mmol/L (ref 0.5–1.9)
Lactic Acid, Venous: 1.3 mmol/L (ref 0.5–1.9)
Lactic Acid, Venous: 1.4 mmol/L (ref 0.5–1.9)

## 2024-11-09 LAB — TROPONIN T, HIGH SENSITIVITY
Troponin T High Sensitivity: 1258 ng/L (ref 0–19)
Troponin T High Sensitivity: 1491 ng/L (ref 0–19)
Troponin T High Sensitivity: 1732 ng/L (ref 0–19)
Troponin T High Sensitivity: 40 ng/L — ABNORMAL HIGH (ref 0–19)
Troponin T High Sensitivity: 703 ng/L (ref 0–19)

## 2024-11-09 LAB — CBC
HCT: 31.1 % — ABNORMAL LOW (ref 36.0–46.0)
Hemoglobin: 10.5 g/dL — ABNORMAL LOW (ref 12.0–15.0)
MCH: 28.2 pg (ref 26.0–34.0)
MCHC: 33.8 g/dL (ref 30.0–36.0)
MCV: 83.6 fL (ref 80.0–100.0)
Platelets: 167 K/uL (ref 150–400)
RBC: 3.72 MIL/uL — ABNORMAL LOW (ref 3.87–5.11)
RDW: 14 % (ref 11.5–15.5)
WBC: 6.3 K/uL (ref 4.0–10.5)
nRBC: 0 % (ref 0.0–0.2)

## 2024-11-09 LAB — RESP PANEL BY RT-PCR (RSV, FLU A&B, COVID)  RVPGX2
Influenza A by PCR: NEGATIVE
Influenza B by PCR: NEGATIVE
Resp Syncytial Virus by PCR: NEGATIVE
SARS Coronavirus 2 by RT PCR: NEGATIVE

## 2024-11-09 LAB — HIV ANTIBODY (ROUTINE TESTING W REFLEX): HIV Screen 4th Generation wRfx: NONREACTIVE

## 2024-11-09 LAB — MAGNESIUM: Magnesium: 1.9 mg/dL (ref 1.7–2.4)

## 2024-11-09 LAB — ETHANOL: Alcohol, Ethyl (B): <15 mg/dL

## 2024-11-09 LAB — D-DIMER, QUANTITATIVE: D-Dimer, Quant: 1.16 ug{FEU}/mL — ABNORMAL HIGH (ref 0.00–0.50)

## 2024-11-09 LAB — URINE DRUG SCREEN
Amphetamines: NEGATIVE
Barbiturates: NEGATIVE
Benzodiazepines: NEGATIVE
Cocaine: NEGATIVE
Fentanyl: POSITIVE — AB
Methadone Scn, Ur: NEGATIVE
Opiates: NEGATIVE
Tetrahydrocannabinol: NEGATIVE

## 2024-11-09 LAB — TSH: TSH: 1.67 u[IU]/mL (ref 0.350–4.500)

## 2024-11-09 LAB — CBG MONITORING, ED
Glucose-Capillary: 82 mg/dL (ref 70–99)
Glucose-Capillary: 87 mg/dL (ref 70–99)

## 2024-11-09 LAB — HEMOGLOBIN A1C
Hgb A1c MFr Bld: 5.9 % — ABNORMAL HIGH (ref 4.8–5.6)
Mean Plasma Glucose: 122.63 mg/dL

## 2024-11-09 LAB — PROTIME-INR
INR: 1.1 (ref 0.8–1.2)
Prothrombin Time: 15 s (ref 11.4–15.2)

## 2024-11-09 LAB — PROCALCITONIN: Procalcitonin: 0.18 ng/mL

## 2024-11-09 LAB — T4, FREE: Free T4: 1.11 ng/dL (ref 0.80–2.00)

## 2024-11-09 MED ORDER — ASPIRIN 81 MG PO TBEC
81.0000 mg | DELAYED_RELEASE_TABLET | Freq: Every day | ORAL | Status: DC
  Administered 2024-11-09: 81 mg via ORAL
  Filled 2024-11-09: qty 1

## 2024-11-09 MED ORDER — ONDANSETRON HCL 4 MG PO TABS
4.0000 mg | ORAL_TABLET | Freq: Four times a day (QID) | ORAL | Status: DC | PRN
  Administered 2024-11-09: 4 mg via ORAL
  Filled 2024-11-09: qty 1

## 2024-11-09 MED ORDER — ACETAMINOPHEN 325 MG PO TABS: 650.0000 mg | ORAL_TABLET | Freq: Four times a day (QID) | ORAL | Status: DC | PRN

## 2024-11-09 MED ORDER — ONDANSETRON HCL 4 MG/2ML IJ SOLN: 4.0000 mg | Freq: Four times a day (QID) | INTRAMUSCULAR | Status: DC | PRN

## 2024-11-09 MED ORDER — MORPHINE SULFATE (PF) 2 MG/ML IV SOLN
2.0000 mg | INTRAVENOUS | Status: DC | PRN
  Filled 2024-11-09: qty 1

## 2024-11-09 MED ORDER — POTASSIUM CHLORIDE CRYS ER 20 MEQ PO TBCR
40.0000 meq | EXTENDED_RELEASE_TABLET | Freq: Once | ORAL | Status: AC
  Administered 2024-11-09: 40 meq via ORAL
  Filled 2024-11-09: qty 2

## 2024-11-09 MED ORDER — IOHEXOL 350 MG/ML SOLN
75.0000 mL | Freq: Once | INTRAVENOUS | Status: AC | PRN
  Administered 2024-11-09: 75 mL via INTRAVENOUS

## 2024-11-09 MED ORDER — HYDROCODONE-ACETAMINOPHEN 5-325 MG PO TABS
1.0000 | ORAL_TABLET | ORAL | Status: DC | PRN
  Administered 2024-11-09: 1 via ORAL
  Administered 2024-11-09: 2 via ORAL
  Filled 2024-11-09: qty 1
  Filled 2024-11-09: qty 2

## 2024-11-09 MED ORDER — CARVEDILOL 6.25 MG PO TABS
3.1250 mg | ORAL_TABLET | Freq: Two times a day (BID) | ORAL | Status: DC
  Administered 2024-11-09: 3.125 mg via ORAL
  Filled 2024-11-09: qty 1

## 2024-11-09 MED ORDER — HEPARIN BOLUS VIA INFUSION
3400.0000 [IU] | Freq: Once | INTRAVENOUS | Status: AC
  Administered 2024-11-09: 3400 [IU] via INTRAVENOUS
  Filled 2024-11-09: qty 3400

## 2024-11-09 MED ORDER — SODIUM CHLORIDE 0.9 % IV SOLN: INTRAVENOUS | Status: DC

## 2024-11-09 MED ORDER — ENOXAPARIN SODIUM 40 MG/0.4ML IJ SOSY
40.0000 mg | PREFILLED_SYRINGE | INTRAMUSCULAR | Status: DC
  Administered 2024-11-09: 40 mg via SUBCUTANEOUS
  Filled 2024-11-09: qty 0.4

## 2024-11-09 MED ORDER — ACETAMINOPHEN 650 MG RE SUPP: 650.0000 mg | Freq: Four times a day (QID) | RECTAL | Status: DC | PRN

## 2024-11-09 MED ORDER — INSULIN ASPART 100 UNIT/ML IJ SOLN
0.0000 [IU] | Freq: Three times a day (TID) | INTRAMUSCULAR | Status: DC
  Administered 2024-11-11: 2 [IU] via SUBCUTANEOUS
  Filled 2024-11-09: qty 2

## 2024-11-09 MED ORDER — HEPARIN (PORCINE) 25000 UT/250ML-% IV SOLN
650.0000 [IU]/h | INTRAVENOUS | Status: DC
  Administered 2024-11-09: 650 [IU]/h via INTRAVENOUS
  Filled 2024-11-09: qty 250

## 2024-11-09 NOTE — ED Notes (Signed)
 Alert, NAD, calm, interactive, resps e/u, skin W&D, mentions chronic MSK R shoulder pain, denies CP, sob,dizziness or [place]. NSR.

## 2024-11-09 NOTE — Assessment & Plan Note (Signed)
 Continue amitriptyline  once verified

## 2024-11-09 NOTE — Assessment & Plan Note (Addendum)
 Blood sugar slightly elevated at 214 Get A1c to screen for diabetes Consistent carbohydrate diet

## 2024-11-09 NOTE — Assessment & Plan Note (Signed)
 Possibly related to mild ischemia related to hypotension Will trend and evaluate for improvement with hydration

## 2024-11-09 NOTE — ED Notes (Signed)
 Pt alert, NAD, calm, interactive, resting comfortably. Echo at [facility].

## 2024-11-09 NOTE — Assessment & Plan Note (Signed)
 Renal function near baseline.

## 2024-11-09 NOTE — Consult Note (Signed)
 "  CARDIOLOGY CONSULT NOTE               Patient ID: Chelsey Wilson MRN: [id number] DOB/AGE: 1966-06-29 59 y.o.  Admit date: 11/08/2024 Referring Physician Dr. Delayne Solian hospitalist [facility] Primary Cardiologist Dr. GORMAN Bathe cardiologist Reason for Consultation SVT sustained elevated troponin Jon  HPI: Patient is a 59 year old female known coronary disease previous PCI and stent to circumflex in 2017 hypertension hyperlipidemia elevated blood sugar mild renal sufficiency states that she started not feeling well went to the clinic was seen was found to have elevated heart rate and sent to the emergency room patient was found to have a narrow complex tachycardia rate of 180 she complained of chest pain shortness of breath chest tightness patient was subsequently cardioverted emergently in the emergency room.  History of hypertension hyperlipidemia presented for further evaluation after rate control and cardioversion chest pain is somewhat better now here for further cardiac evaluation  Review of systems complete and found to be negative unless listed above     Past Medical History:  Diagnosis Date   Bacteremia GNR 08/03/2013   Essential hypertension    HLD (hyperlipidemia)    Hypokalemia 08/02/2013   Pyelonephritis 08/02/2013   Sepsis (HCC) 08/03/2013    Past Surgical History:  Procedure Laterality Date   CARDIAC CATHETERIZATION Right 05/27/2016   Procedure: Left Heart Cath and Coronary Angiography;  Surgeon: Denyse DELENA Bathe, MD;  Location: [facility] INVASIVE CV LAB;  Service: Cardiovascular;  Laterality: Right;   CARDIAC CATHETERIZATION N/A 05/27/2016   Procedure: Coronary Stent Intervention;  Surgeon: Cara JONETTA Lovelace, MD;  Location: [facility] INVASIVE CV LAB;  Service: Cardiovascular;  Laterality: N/A;    (Not in a hospital admission)  Social History   Socioeconomic History   Marital status: Single    Spouse name: Not on file   Number of children: Not on  file   Years of education: Not on file   Highest education level: Not on file  Occupational History   Not on file  Tobacco Use   Smoking status: Never   Smokeless tobacco: Never  Substance and Sexual Activity   Alcohol use: No   Drug use: No   Sexual activity: Never  Other Topics Concern   Not on file  Social History Narrative   Not on file   Social Drivers of Health   Tobacco Use: Low Risk (11/08/2024)   Patient History    Smoking Tobacco Use: Never    Smokeless Tobacco Use: Never    Passive Exposure: Not on file  Financial Resource Strain: Not on file  Food Insecurity: Not on file  Transportation Needs: Not on file  Physical Activity: Not on file  Stress: Not on file  Social Connections: Not on file  Intimate Partner Violence: Not on file  Depression (EYV7-0): Not on file  Alcohol Screen: Not on file  Housing: Not on file  Utilities: Not on file  Health Literacy: Not on file    Family History  Problem Relation Age of Onset   Other Neg Hx       Review of systems complete and found to be negative unless listed above      PHYSICAL EXAM  General: Well developed, well nourished, in no acute distress HEENT:  Normocephalic and atramatic Neck:  No JVD.  Lungs: Clear bilaterally to auscultation and percussion. Heart: HRRR . Normal S1 and S2 without gallops or murmurs.  Abdomen: Bowel sounds are positive, abdomen soft and  non-tender  Msk:  Back normal, normal gait. Normal strength and tone for age. Extremities: No clubbing, cyanosis or edema.   Neuro: Alert and oriented X 3. Psych:  Good affect, responds appropriately  Labs:   Lab Results  Component Value Date   WBC 6.3 11/09/2024   HGB 10.5 (L) 11/09/2024   HCT 31.1 (L) 11/09/2024   MCV 83.6 11/09/2024   PLT 167 11/09/2024    Recent Labs  Lab 11/09/24 0330  NA 143  K 3.3*  CL 109  CO2 20*  BUN 17  CREATININE 0.94  CALCIUM  8.5*  PROT 6.3*  BILITOT 0.4  ALKPHOS 86  ALT 240*  AST 322*   GLUCOSE 72   Lab Results  Component Value Date   CKTOTAL 185 (H) 07/28/2016   TROPONINI 3.44 (HH) 05/27/2016    Lab Results  Component Value Date   CHOL 274 (H) 04/13/2023   CHOL 241 (H) 05/27/2016   CHOL 221 (H) 11/11/2015   Lab Results  Component Value Date   HDL 35 (L) 04/13/2023   HDL 41 05/27/2016   HDL 57 11/11/2015   Lab Results  Component Value Date   LDLCALC 201 (H) 04/13/2023   LDLCALC 174 (H) 05/27/2016   LDLCALC 139 (H) 11/11/2015   Lab Results  Component Value Date   TRIG 196 (H) 04/13/2023   TRIG 132 05/27/2016   TRIG 123 11/11/2015   Lab Results  Component Value Date   CHOLHDL 7.8 (H) 04/13/2023   CHOLHDL 5.9 05/27/2016   CHOLHDL 3.9 11/11/2015   No results found for: LDLDIRECT    Radiology: CT Angio Chest PE W and/or Wo Contrast Result Date: 11/09/2024 EXAM: CTA CHEST 11/09/2024 01:36:21 AM TECHNIQUE: CTA of the chest was performed after the administration of intravenous contrast. Multiplanar reformatted images are provided for review. MIP images are provided for review. Automated exposure control, iterative reconstruction, and/or weight based adjustment of the mA/kV was utilized to reduce the radiation dose to as low as reasonably achievable. COMPARISON: None available. CLINICAL HISTORY: Pulmonary embolism (PE) high probability. Chest pain, dyspnea. FINDINGS: PULMONARY ARTERIES: Pulmonary arteries are adequately opacified for evaluation. No acute pulmonary embolus. Main pulmonary artery is normal in caliber. MEDIASTINUM: Extensive multivessel coronary artery calcification. Global cardiac size is within normal limits though left ventricular dilation is noted. Mild atherosclerotic calcification within the thoracic aorta. LYMPH NODES: No mediastinal, hilar or axillary lymphadenopathy. LUNGS AND PLEURA: The lungs are without acute process. No focal consolidation or pulmonary edema. No evidence of pleural effusion or pneumothorax. UPPER ABDOMEN: Small hiatal  hernia. SOFT TISSUES AND BONES: No acute bone or soft tissue abnormality. RAF SCORE: Aortic atherosclerosis (ICD-10: I70.0). No pulmonary embolism. IMPRESSION: 1. No pulmonary embolism. 2. Extensive multivessel coronary artery calcification and mild atherosclerotic calcification of the thoracic aorta. 3. Left ventricular dilation. 4. Small hiatal hernia. 5. RAF score includes aortic atherosclerosis (ICD10-I70.0) Electronically signed by: Dorethia Molt MD 11/09/2024 01:45 AM EST RP Workstation: [id number]   DG Chest 1 View Result Date: 11/08/2024 EXAM: 2 VIEW(S) XRAY OF THE CHEST 11/08/2024 11:29:00 PM COMPARISON: 04/01/2023 CLINICAL HISTORY: Chest pain. Pt reported chest pain shortness of breath that began today. Pt denies cough or congestion. Pts only cardiac hx HTN. FINDINGS: LINES, TUBES AND DEVICES: Pacer pads on left chest. LUNGS AND PLEURA: No focal pulmonary opacity. No pleural effusion. No pneumothorax. HEART AND MEDIASTINUM: No acute abnormality of the cardiac and mediastinal silhouettes. BONES AND SOFT TISSUES: No acute osseous abnormality. IMPRESSION: 1. No acute cardiopulmonary process.  Electronically signed by: Morgane Naveau MD 11/08/2024 11:32 PM EST RP Workstation: [id number]    EKG: Initial EKG rapid SVT rate of 180 nonspecific ST-T wave changes  ASSESSMENT AND PLAN:  Narrow complex tachycardia SVT Status post cardioversion Coronary artery disease Status post PCI and stent Hypertension Renal sufficiency stage III Hyperglycemia Mild lactic acidosis Abnormal EKG Hyperlipidemia Transaminitis . Plan Agree with admit recommend rate control Cardizem  heparin  for anticoagulation Echocardiogram for evaluation of left ventricular function wall motion Follow-up troponins EKGs telemetry Will consider EP evaluation after rate control management Elevated troponin concerning for ischemia possibly demand ischemia but myocardial injury with known coronary disease Chest pain angina during  episode would consider cardiac evaluation for ischemia consider cardiac cath prior to discharge Statin therapy for hyperlipidemia management Consider cardiac cath prior to discharge   Signed: Cara JONETTA Lovelace MD 11/09/2024, 7:41 AM      "

## 2024-11-09 NOTE — Progress Notes (Signed)
*  PRELIMINARY RESULTS* Echocardiogram 2D Echocardiogram has been performed.  Floydene Harder 11/09/2024, 2:51 PM

## 2024-11-09 NOTE — Consult Note (Signed)
 PHARMACY - ANTICOAGULATION CONSULT NOTE  Pharmacy Consult for Heparin   Indication: chest pain/ACS  Allergies[1]  Patient Measurements: Height: 5' (152.4 cm) Weight: 61.2 kg (134 lb 14.7 oz) IBW/kg (Calculated) : 45.5 HEPARIN  DW (KG): 58.2  Vital Signs: Temp: 98.2 F (36.8 C) (01/15 1441) Temp Source: Oral (01/15 1441) BP: 156/83 (01/15 1530) Pulse Rate: 65 (01/15 1545)  Labs: Recent Labs    11/08/24 2342 11/09/24 0330  HGB 11.2* 10.5*  HCT 34.3* 31.1*  PLT 188 167  LABPROT 15.0  --   INR 1.1  --   CREATININE 1.32* 0.94    Estimated Creatinine Clearance: 52.7 mL/min (by C-G formula based on SCr of 0.94 mg/dL).   Medical History: Past Medical History:  Diagnosis Date   Bacteremia GNR 08/03/2013   Essential hypertension    HLD (hyperlipidemia)    Hypokalemia 08/02/2013   Pyelonephritis 08/02/2013   Sepsis (HCC) 08/03/2013    Medications:  (Not in a hospital admission)  Scheduled:   aspirin  EC  81 mg Oral Daily   carvedilol   3.125 mg Oral BID   insulin  aspart  0-15 Units Subcutaneous TID WC   Infusions:   lactated ringers  150 mL/hr at 11/09/24 1535   PRN: acetaminophen  **OR** acetaminophen , HYDROcodone -acetaminophen , morphine  injection, ondansetron  **OR** ondansetron  (ZOFRAN ) IV Anti-infectives (From admission, onward)    None       Assessment: 59 y.o. female with medical history significant for CAD with history of NSTEMI s/p PCI, HTN, CKD stage llla, fibromyalgia being admitted following emergent DC cardioversion for unstable narrow complex tachyarrhythmia. Trop T trending up 40 > 1732. Hgb is around baseline. No DOAC PTA.   Goal of Therapy:  Heparin  level 0.3-0.7 units/ml Monitor platelets by anticoagulation protocol: Yes   Plan:  Give 3400 units bolus x 1 Start heparin  infusion at 650 units/hr Check anti-Xa level in 6 hours and daily while on heparin  Continue to monitor H&H and platelets  Chelsey Wilson, PharmD, BCPS 11/09/2024,4:02  PM      [1]  Allergies Allergen Reactions   Metoprolol  Palpitations

## 2024-11-09 NOTE — Assessment & Plan Note (Signed)
 Continue carvedilol 

## 2024-11-09 NOTE — Assessment & Plan Note (Addendum)
 Lactic acidosis Suspect related to tissue hypoxia from hypotension caused by unstable tachyarrhythmia Received an LR bolus in the ED Will continue IV fluids to complete a liter Will continue to monitor with serial BMPs

## 2024-11-09 NOTE — Progress Notes (Signed)
 Heart Failure Navigator Progress Note  Assessed for Heart & Vascular TOC clinic readiness.  Patient does not meet criteria due to current [facility] Cardiology consult.  Navigator will sign off at this time.  Charmaine Pines, RN, BSN [facility] Heart Failure Navigator Secure Chat Only

## 2024-11-09 NOTE — ED Notes (Signed)
Echo at BS 

## 2024-11-09 NOTE — Assessment & Plan Note (Addendum)
 Patient converted to sinus rhythm with electrical cardioversion Resolution in symptoms s/p cardioversion Continuous cardiac monitoring Treat any electrolyte abnormalities, correct acidosis Continue carvedilol  that was started by PCP on 11/08/2024 Will get echo Cardiology consult Will keep n.p.o. in case of procedure

## 2024-11-09 NOTE — Assessment & Plan Note (Addendum)
 Elevated troponin Extensive multivessel coronary artery calcification on CTA chest Patient presents with chest pain likely related to tachyarrhythmia, resolved post cardioversion Elevated troponin 40-->703 likely demand ischemia, then cardioversion Will continue to trend to peak continue aspirin , carvedilol  Will get echocardiogram

## 2024-11-09 NOTE — ED Notes (Signed)
 Called lab to get second set of blood cultures and other ordered lab test.

## 2024-11-09 NOTE — Progress Notes (Signed)
 Nonbillable note Patient admitted to the hospital following cardioversion for unstable narrow complex tachycardia concerning for SVT. Patient has a known history of coronary artery disease and has significantly elevated troponin levels concerning for possible non-ST elevation MI Appreciate cardiology input. Will start patient on a heparin  drip Continue low-dose carvedilol  and aspirin  Patient not on statins due to history of transaminitis thought to be secondary to hypotension. Noted to have significant lactic acidosis thought to be secondary to hypotension Continue IV fluid hydration Trend lactic acid levels and troponin levels Patient is scheduled for left heart cath in a.m.

## 2024-11-09 NOTE — ED Notes (Signed)
 Called lab for second time requesting for collection of second set of blood cultures and other ordered lab test that have not been collected.

## 2024-11-09 NOTE — ED Notes (Signed)
 CCMD called to initiate cardiac monitoring.

## 2024-11-09 NOTE — H&P (Addendum)
 " History and Physical    Patient: Chelsey Wilson FMW:[id number] DOB: [birth year] DOA: 11/08/2024 DOS: the patient was seen and examined on 11/09/2024 PCP: Carin Gauze, NP  Patient coming from: Home  Chief Complaint:  Chief Complaint  Patient presents with   Chest Pain    HPI: Chelsey Wilson is a 59 y.o. female with medical history significant for CAD with history of NSTEMI s/p PCI, HTN, CKD stage llla, fibromyalgia being admitted following emergent DC cardioversion for unstable narrow complex tachyarrhythmia.  Patient who has a history of metoprolol  intolerance was seen earlier in the day by her PCP on 1/14 as an urgent visit for palpitations.  She was referred to cardiology, however later in the day she had ongoing symptoms going on to develop chest pain and shortness of breath prompting the visit to the ED. On arrival, she was tachycardic to 182 and hypotensive to 75/57 with EKG showing sinus tachycardia with short PR.  She was afebrile, tachypneic to 22 with O2 sat 98% on room air. She underwent DC cardioversion at 120 J converting to sinus rhythm at 94 with BP 128/85. Other workup in the ED significant for the following: - Troponin 40, proBNP 2017, D-dimer 1.16 - CBC unremarkable with baseline hemoglobin of 11.2. - CMP notable for Mild hyperglycemia of 214, creatinine 1.32 up from baseline of 1.15, transaminitis with AST/ALT of 305/268, anion gap of 18 and bicarb of 17 -Magnesium  1.9 and potassium 4.7 CTA PE protocol negative for PE but showing extensive multivessel coronary artery calcification as well as mild atherosclerotic calcification of the thoracic aorta among other nonacute findings.  Patient was treated with LR bolus fentanyl  for pain Zofran  Admission requested     Past Medical History:  Diagnosis Date   Bacteremia GNR 08/03/2013   Essential hypertension    HLD (hyperlipidemia)    Hypokalemia 08/02/2013   Pyelonephritis 08/02/2013   Sepsis (HCC) 08/03/2013   Past  Surgical History:  Procedure Laterality Date   CARDIAC CATHETERIZATION Right 05/27/2016   Procedure: Left Heart Cath and Coronary Angiography;  Surgeon: Denyse DELENA Bathe, MD;  Location: [facility] INVASIVE CV LAB;  Service: Cardiovascular;  Laterality: Right;   CARDIAC CATHETERIZATION N/A 05/27/2016   Procedure: Coronary Stent Intervention;  Surgeon: Cara JONETTA Lovelace, MD;  Location: [facility] INVASIVE CV LAB;  Service: Cardiovascular;  Laterality: N/A;   Social History:  reports that she has never smoked. She has never used smokeless tobacco. She reports that she does not drink alcohol and does not use drugs.  Allergies[1]  Family History  Problem Relation Age of Onset   Other Neg Hx     Prior to Admission medications  Medication Sig Start Date End Date Taking? Authorizing Provider  amitriptyline  (ELAVIL ) 50 MG tablet Take 1 tablet (50 mg total) by mouth at bedtime. Patient not taking: Reported on 11/08/2024 05/04/23   Bathe Fredy RAMAN, MD  aspirin  EC 81 MG EC tablet Take 1 tablet (81 mg total) by mouth daily. Patient not taking: Reported on 11/08/2024 05/28/16   Sherial Bail, MD  carvedilol  (COREG ) 3.125 MG tablet Take 1 tablet (3.125 mg total) by mouth 2 (two) times daily. 11/08/24 11/08/25  Scoggins, Amber, NP  Evolocumab  (REPATHA ) 140 MG/ML SOSY Inject 140 mg into the skin every 14 (fourteen) days. 11/08/24   Scoggins, Amber, NP  pantoprazole  (PROTONIX ) 40 MG tablet Take 1 tablet (40 mg total) by mouth daily. 11/08/24   Carin Gauze, NP    Physical Exam: Vitals:   11/09/24 0030 11/09/24 0100  11/09/24 0112 11/09/24 0130  BP: (!) 142/75 128/85  (!) 146/81  Pulse: 81 94  82  Resp: 14   14  Temp:   97.9 F (36.6 C)   TempSrc:   Oral   SpO2: 100% 99%  100%  Weight:      Height:       Physical Exam Vitals and nursing note reviewed.  Constitutional:      General: She is not in acute distress. HENT:     Head: Normocephalic and atraumatic.  Cardiovascular:     Rate and Rhythm: Normal rate and  regular rhythm.     Heart sounds: Normal heart sounds.  Pulmonary:     Effort: Pulmonary effort is normal.     Breath sounds: Normal breath sounds.  Abdominal:     Palpations: Abdomen is soft.     Tenderness: There is no abdominal tenderness.  Neurological:     Mental Status: Mental status is at baseline.     Labs on Admission: I have personally reviewed following labs and imaging studies  CBC: Recent Labs  Lab 11/08/24 2342  WBC 5.9  NEUTROABS 3.8  HGB 11.2*  HCT 34.3*  MCV 84.7  PLT 188   Basic Metabolic Panel: Recent Labs  Lab 11/08/24 2342  NA 140  K 4.7  CL 105  CO2 17*  GLUCOSE 214*  BUN 20  CREATININE 1.32*  CALCIUM  8.6*  MG 1.9   GFR: Estimated Creatinine Clearance: 37.5 mL/min (A) (by C-G formula based on SCr of 1.32 mg/dL (H)). Liver Function Tests: Recent Labs  Lab 11/08/24 2342  AST 305*  ALT 268*  ALKPHOS 101  BILITOT 0.7  PROT 6.9  ALBUMIN 4.0   No results for input(s): LIPASE, AMYLASE in the last 168 hours. No results for input(s): AMMONIA in the last 168 hours. Coagulation Profile: Recent Labs  Lab 11/08/24 2342  INR 1.1   Cardiac Enzymes: No results for input(s): CKTOTAL, CKMB, CKMBINDEX, TROPONINI in the last 168 hours. BNP (last 3 results) Recent Labs    11/08/24 2342  PROBNP 2,017.0*   HbA1C: No results for input(s): HGBA1C in the last 72 hours. CBG: Recent Labs  Lab 11/08/24 2307  GLUCAP 127*   Lipid Profile: No results for input(s): CHOL, HDL, LDLCALC, TRIG, CHOLHDL, LDLDIRECT in the last 72 hours. Thyroid Function Tests: Recent Labs    11/08/24 2342  TSH 1.670  FREET4 1.11   Anemia Panel: No results for input(s): VITAMINB12, FOLATE, FERRITIN, TIBC, IRON, RETICCTPCT in the last 72 hours. Urine analysis:    Component Value Date/Time   COLORURINE AMBER (A) 04/01/2023 1918   APPEARANCEUR CLOUDY (A) 04/01/2023 1918   LABSPEC 1.011 04/01/2023 1918   PHURINE 5.0  04/01/2023 1918   GLUCOSEU NEGATIVE 04/01/2023 1918   HGBUR MODERATE (A) 04/01/2023 1918   BILIRUBINUR negative 06/01/2023 1402   KETONESUR 5 (A) 04/01/2023 1918   PROTEINUR Positive (A) 06/01/2023 1402   PROTEINUR 100 (A) 04/01/2023 1918   UROBILINOGEN 0.2 06/01/2023 1402   UROBILINOGEN 1.0 08/02/2013 0503   NITRITE negative 06/01/2023 1402   NITRITE NEGATIVE 04/01/2023 1918   LEUKOCYTESUR Large (3+) (A) 06/01/2023 1402   LEUKOCYTESUR LARGE (A) 04/01/2023 1918    Radiological Exams on Admission: CT Angio Chest PE W and/or Wo Contrast Result Date: 11/09/2024 EXAM: CTA CHEST 11/09/2024 01:36:21 AM TECHNIQUE: CTA of the chest was performed after the administration of intravenous contrast. Multiplanar reformatted images are provided for review. MIP images are provided for review. Automated exposure  control, iterative reconstruction, and/or weight based adjustment of the mA/kV was utilized to reduce the radiation dose to as low as reasonably achievable. COMPARISON: None available. CLINICAL HISTORY: Pulmonary embolism (PE) high probability. Chest pain, dyspnea. FINDINGS: PULMONARY ARTERIES: Pulmonary arteries are adequately opacified for evaluation. No acute pulmonary embolus. Main pulmonary artery is normal in caliber. MEDIASTINUM: Extensive multivessel coronary artery calcification. Global cardiac size is within normal limits though left ventricular dilation is noted. Mild atherosclerotic calcification within the thoracic aorta. LYMPH NODES: No mediastinal, hilar or axillary lymphadenopathy. LUNGS AND PLEURA: The lungs are without acute process. No focal consolidation or pulmonary edema. No evidence of pleural effusion or pneumothorax. UPPER ABDOMEN: Small hiatal hernia. SOFT TISSUES AND BONES: No acute bone or soft tissue abnormality. RAF SCORE: Aortic atherosclerosis (ICD-10: I70.0). No pulmonary embolism. IMPRESSION: 1. No pulmonary embolism. 2. Extensive multivessel coronary artery calcification  and mild atherosclerotic calcification of the thoracic aorta. 3. Left ventricular dilation. 4. Small hiatal hernia. 5. RAF score includes aortic atherosclerosis (ICD10-I70.0) Electronically signed by: Dorethia Molt MD 11/09/2024 01:45 AM EST RP Workstation: [id number]   DG Chest 1 View Result Date: 11/08/2024 EXAM: 2 VIEW(S) XRAY OF THE CHEST 11/08/2024 11:29:00 PM COMPARISON: 04/01/2023 CLINICAL HISTORY: Chest pain. Pt reported chest pain shortness of breath that began today. Pt denies cough or congestion. Pts only cardiac hx HTN. FINDINGS: LINES, TUBES AND DEVICES: Pacer pads on left chest. LUNGS AND PLEURA: No focal pulmonary opacity. No pleural effusion. No pneumothorax. HEART AND MEDIASTINUM: No acute abnormality of the cardiac and mediastinal silhouettes. BONES AND SOFT TISSUES: No acute osseous abnormality. IMPRESSION: 1. No acute cardiopulmonary process. Electronically signed by: Morgane Naveau MD 11/08/2024 11:32 PM EST RP Workstation: [id number]   Data Reviewed for HPI: Relevant notes from primary care and specialist visits, past discharge summaries as available in EHR, including Care Everywhere. Prior diagnostic testing as pertinent to current admission diagnoses Updated medications and problem lists for reconciliation ED course, including vitals, labs, imaging, treatment and response to treatment Triage notes, nursing and pharmacy notes and ED provider's notes Notable results as noted above in HPI      Assessment and Plan: * Narrow complex tachycardia, unstable, s/p DC cardioversion 11/08/2024 Patient converted to sinus rhythm with electrical cardioversion Resolution in symptoms s/p cardioversion Continuous cardiac monitoring Treat any electrolyte abnormalities, correct acidosis Continue carvedilol  that was started by PCP on 11/08/2024 Will get echo Cardiology consult Will keep n.p.o. in case of procedure  CAD S/P percutaneous coronary angioplasty Elevated troponin Extensive  multivessel coronary artery calcification on CTA chest Patient presents with chest pain likely related to tachyarrhythmia, resolved post cardioversion Elevated troponin 40-->703 likely demand ischemia, then cardioversion Will continue to trend to peak continue aspirin , carvedilol  Will get echocardiogram  Hyperglycemia Blood sugar slightly elevated at 214 Get A1c to screen for diabetes Consistent carbohydrate diet  High anion gap metabolic acidosis Lactic acidosis Suspect related to tissue hypoxia from hypotension caused by unstable tachyarrhythmia Received an LR bolus in the ED Will continue IV fluids to complete a liter Will continue to monitor with serial BMPs  Transaminitis Possibly related to mild ischemia related to hypotension Will trend and evaluate for improvement with hydration  Essential hypertension, benign Continue carvedilol   Chronic kidney disease, stage 3a (HCC) Renal function near baseline  Fibromyalgia Continue amitriptyline  once verified    DVT prophylaxis: Lovenox   Consults: [facility] cardiology, Dr. Florencio  Advance Care Planning:   Code Status: Prior   Family Communication: none  Disposition Plan: Back to previous  home environment  Severity of Illness: The appropriate patient status for this patient is OBSERVATION. Observation status is judged to be reasonable and necessary in order to provide the required intensity of service to ensure the patient's safety. The patient's presenting symptoms, physical exam findings, and initial radiographic and laboratory data in the context of their medical condition is felt to place them at decreased risk for further clinical deterioration. Furthermore, it is anticipated that the patient will be medically stable for discharge from the hospital within 2 midnights of admission.   Author: Delayne LULLA Solian, MD 11/09/2024 2:50 AM  For on call review [email or web address].      [1]  Allergies Allergen Reactions   Metoprolol   Palpitations   "

## 2024-11-09 NOTE — ED Notes (Addendum)
 NSR 76, then ~2 minute ([id number]) run of ST HR 140 at rest asymptomatic, labs drawn and sent, EKG repeated, back to NSR 90.

## 2024-11-09 NOTE — ED Notes (Signed)
 Up to b/r, steady gait. Denies pain, nausea or dizziness. Endorses some sob. VSS.

## 2024-11-10 ENCOUNTER — Other Ambulatory Visit: Payer: Self-pay

## 2024-11-10 ENCOUNTER — Inpatient Hospital Stay: Payer: Self-pay

## 2024-11-10 ENCOUNTER — Other Ambulatory Visit (HOSPITAL_COMMUNITY): Payer: Self-pay

## 2024-11-10 ENCOUNTER — Encounter: Admission: EM | Disposition: A | Payer: Self-pay | Source: Home / Self Care | Attending: Internal Medicine

## 2024-11-10 DIAGNOSIS — G9340 Encephalopathy, unspecified: Secondary | ICD-10-CM

## 2024-11-10 DIAGNOSIS — I2510 Atherosclerotic heart disease of native coronary artery without angina pectoris: Secondary | ICD-10-CM

## 2024-11-10 DIAGNOSIS — Z9861 Coronary angioplasty status: Secondary | ICD-10-CM

## 2024-11-10 HISTORY — PX: LEFT HEART CATH AND CORONARY ANGIOGRAPHY: CATH118249

## 2024-11-10 HISTORY — PX: CORONARY STENT INTERVENTION: CATH118234

## 2024-11-10 LAB — BLOOD GAS, ARTERIAL
Acid-Base Excess: 5.2 mmol/L — ABNORMAL HIGH (ref 0.0–2.0)
Bicarbonate: 26.6 mmol/L (ref 20.0–28.0)
O2 Content: 2 L/min
O2 Saturation: 98.4 %
Patient temperature: 37
pCO2 arterial: 29 mmHg — ABNORMAL LOW (ref 32–48)
pH, Arterial: 7.57 — ABNORMAL HIGH (ref 7.35–7.45)
pO2, Arterial: 108 mmHg (ref 83–108)

## 2024-11-10 LAB — CBC
HCT: 33.3 % — ABNORMAL LOW (ref 36.0–46.0)
Hemoglobin: 10.9 g/dL — ABNORMAL LOW (ref 12.0–15.0)
MCH: 27.5 pg (ref 26.0–34.0)
MCHC: 32.7 g/dL (ref 30.0–36.0)
MCV: 83.9 fL (ref 80.0–100.0)
Platelets: 180 K/uL (ref 150–400)
RBC: 3.97 MIL/uL (ref 3.87–5.11)
RDW: 14.1 % (ref 11.5–15.5)
WBC: 4.8 K/uL (ref 4.0–10.5)
nRBC: 0 % (ref 0.0–0.2)

## 2024-11-10 LAB — GLUCOSE, CAPILLARY
Glucose-Capillary: 69 mg/dL — ABNORMAL LOW (ref 70–99)
Glucose-Capillary: 175 mg/dL — ABNORMAL HIGH (ref 70–99)
Glucose-Capillary: 131 mg/dL — ABNORMAL HIGH (ref 70–99)
Glucose-Capillary: 126 mg/dL — ABNORMAL HIGH (ref 70–99)
Glucose-Capillary: 99 mg/dL (ref 70–99)

## 2024-11-10 LAB — MAGNESIUM: Magnesium: 1.4 mg/dL — ABNORMAL LOW (ref 1.7–2.4)

## 2024-11-10 LAB — BASIC METABOLIC PANEL WITH GFR
Anion gap: 10 (ref 5–15)
BUN: 9 mg/dL (ref 6–20)
CO2: 23 mmol/L (ref 22–32)
Calcium: 8.4 mg/dL — ABNORMAL LOW (ref 8.9–10.3)
Chloride: 108 mmol/L (ref 98–111)
Creatinine, Ser: 0.79 mg/dL (ref 0.44–1.00)
GFR, Estimated: >60 mL/min
Glucose, Bld: 85 mg/dL (ref 70–99)
Potassium: 3.8 mmol/L (ref 3.5–5.1)
Sodium: 141 mmol/L (ref 135–145)

## 2024-11-10 LAB — HEPARIN LEVEL (UNFRACTIONATED)
Heparin Unfractionated: 0.5 [IU]/mL (ref 0.30–0.70)
Heparin Unfractionated: 0.82 [IU]/mL — ABNORMAL HIGH (ref 0.30–0.70)

## 2024-11-10 LAB — CBG MONITORING, ED
Glucose-Capillary: 67 mg/dL — ABNORMAL LOW (ref 70–99)
Glucose-Capillary: 73 mg/dL (ref 70–99)
Glucose-Capillary: 147 mg/dL — ABNORMAL HIGH (ref 70–99)

## 2024-11-10 LAB — APTT: aPTT: 71 s — ABNORMAL HIGH (ref 24–36)

## 2024-11-10 LAB — PROTIME-INR
INR: 1.1 (ref 0.8–1.2)
Prothrombin Time: 15.3 s — ABNORMAL HIGH (ref 11.4–15.2)

## 2024-11-10 LAB — HEMOGLOBIN A1C
Hgb A1c MFr Bld: 6 % — ABNORMAL HIGH (ref 4.8–5.6)
Mean Plasma Glucose: 125.5 mg/dL

## 2024-11-10 LAB — POCT ACTIVATED CLOTTING TIME: Activated Clotting Time: 307 s

## 2024-11-10 LAB — POTASSIUM: Potassium: 3.2 mmol/L — ABNORMAL LOW (ref 3.5–5.1)

## 2024-11-10 MED ORDER — HEPARIN (PORCINE) IN NACL 1000-0.9 UT/500ML-% IV SOLN
INTRAVENOUS | Status: DC | PRN
  Administered 2024-11-10: 1000 mL

## 2024-11-10 MED ORDER — DEXTROSE 50 % IV SOLN
1.0000 | Freq: Once | INTRAVENOUS | Status: AC
  Administered 2024-11-10: 50 mL via INTRAVENOUS

## 2024-11-10 MED ORDER — FENTANYL CITRATE (PF) 100 MCG/2ML IJ SOLN
INTRAMUSCULAR | Status: DC | PRN
  Administered 2024-11-10: 50 ug via INTRAVENOUS

## 2024-11-10 MED ORDER — HEPARIN SODIUM (PORCINE) 1000 UNIT/ML IJ SOLN
INTRAMUSCULAR | Status: AC
  Filled 2024-11-10: qty 10

## 2024-11-10 MED ORDER — HYDRALAZINE HCL 20 MG/ML IJ SOLN
INTRAMUSCULAR | Status: AC
  Filled 2024-11-10: qty 1

## 2024-11-10 MED ORDER — MIDAZOLAM HCL 2 MG/2ML IJ SOLN
INTRAMUSCULAR | Status: AC
  Filled 2024-11-10: qty 2

## 2024-11-10 MED ORDER — FUROSEMIDE 10 MG/ML IJ SOLN: 40.0000 mg | Freq: Once | INTRAMUSCULAR | Status: DC

## 2024-11-10 MED ORDER — CHLORHEXIDINE GLUCONATE CLOTH 2 % EX PADS
6.0000 | MEDICATED_PAD | Freq: Every day | CUTANEOUS | Status: DC
  Administered 2024-11-10 – 2024-11-12 (×3): 6 via TOPICAL

## 2024-11-10 MED ORDER — SODIUM CHLORIDE 0.9 % IV SOLN: 250.0000 mL | INTRAVENOUS | Status: AC | PRN

## 2024-11-10 MED ORDER — ASPIRIN 81 MG PO CHEW
81.0000 mg | CHEWABLE_TABLET | Freq: Every day | ORAL | Status: DC
  Administered 2024-11-11: 81 mg via ORAL
  Filled 2024-11-10 (×2): qty 1

## 2024-11-10 MED ORDER — PRASUGREL HCL 10 MG PO TABS
ORAL_TABLET | ORAL | Status: AC
  Filled 2024-11-10: qty 6

## 2024-11-10 MED ORDER — LORAZEPAM 2 MG/ML IJ SOLN
0.5000 mg | Freq: Once | INTRAMUSCULAR | Status: AC
  Administered 2024-11-10: 0.5 mg via INTRAVENOUS
  Filled 2024-11-10: qty 0.25

## 2024-11-10 MED ORDER — FREE WATER: 500.0000 mL | Freq: Once | Status: DC

## 2024-11-10 MED ORDER — IOHEXOL 300 MG/ML  SOLN
INTRAMUSCULAR | Status: DC | PRN
  Administered 2024-11-10: 145 mL

## 2024-11-10 MED ORDER — POTASSIUM CHLORIDE CRYS ER 20 MEQ PO TBCR: 20.0000 meq | EXTENDED_RELEASE_TABLET | Freq: Once | ORAL | Status: DC

## 2024-11-10 MED ORDER — FENTANYL CITRATE (PF) 100 MCG/2ML IJ SOLN
INTRAMUSCULAR | Status: AC
  Filled 2024-11-10: qty 2

## 2024-11-10 MED ORDER — HEPARIN SODIUM (PORCINE) 1000 UNIT/ML IJ SOLN
INTRAMUSCULAR | Status: DC | PRN
  Administered 2024-11-10: 10000 [IU] via INTRAVENOUS

## 2024-11-10 MED ORDER — ROSUVASTATIN CALCIUM 10 MG PO TABS
20.0000 mg | ORAL_TABLET | Freq: Every day | ORAL | Status: DC
  Administered 2024-11-11 – 2024-11-12 (×2): 20 mg via ORAL
  Filled 2024-11-10: qty 2
  Filled 2024-11-10: qty 1
  Filled 2024-11-10: qty 2

## 2024-11-10 MED ORDER — DEXTROSE 50 % IV SOLN
INTRAVENOUS | Status: AC
  Administered 2024-11-10: 50 mL via INTRAVENOUS
  Filled 2024-11-10: qty 50

## 2024-11-10 MED ORDER — FUROSEMIDE 10 MG/ML IJ SOLN
INTRAMUSCULAR | Status: AC
  Filled 2024-11-10: qty 4

## 2024-11-10 MED ORDER — ONDANSETRON HCL 4 MG/2ML IJ SOLN
INTRAMUSCULAR | Status: AC
  Filled 2024-11-10: qty 2

## 2024-11-10 MED ORDER — ASPIRIN 81 MG PO TBEC
81.0000 mg | DELAYED_RELEASE_TABLET | Freq: Every day | ORAL | Status: DC
  Administered 2024-11-11 – 2024-11-12 (×2): 81 mg via ORAL
  Filled 2024-11-10 (×2): qty 1

## 2024-11-10 MED ORDER — ORAL CARE MOUTH RINSE: 15.0000 mL | OROMUCOSAL | Status: DC | PRN

## 2024-11-10 MED ORDER — SODIUM CHLORIDE 0.9% FLUSH
3.0000 mL | Freq: Two times a day (BID) | INTRAVENOUS | Status: DC
  Administered 2024-11-10 – 2024-11-12 (×4): 3 mL via INTRAVENOUS

## 2024-11-10 MED ORDER — HYDRALAZINE HCL 20 MG/ML IJ SOLN
10.0000 mg | INTRAMUSCULAR | Status: AC | PRN
  Administered 2024-11-10: 10 mg via INTRAVENOUS

## 2024-11-10 MED ORDER — ASPIRIN 81 MG PO CHEW
CHEWABLE_TABLET | ORAL | Status: DC | PRN
  Administered 2024-11-10: 243 mg via ORAL

## 2024-11-10 MED ORDER — POTASSIUM CHLORIDE 10 MEQ/100ML IV SOLN
10.0000 meq | INTRAVENOUS | Status: AC
  Administered 2024-11-10 – 2024-11-11 (×4): 10 meq via INTRAVENOUS
  Filled 2024-11-10 (×4): qty 100

## 2024-11-10 MED ORDER — MAGNESIUM SULFATE 4 GM/100ML IV SOLN
4.0000 g | Freq: Once | INTRAVENOUS | Status: AC
  Administered 2024-11-10: 4 g via INTRAVENOUS
  Filled 2024-11-10: qty 100

## 2024-11-10 MED ORDER — LOSARTAN POTASSIUM 25 MG PO TABS
25.0000 mg | ORAL_TABLET | Freq: Every day | ORAL | Status: DC
  Administered 2024-11-11 – 2024-11-12 (×2): 25 mg via ORAL
  Filled 2024-11-10 (×2): qty 1

## 2024-11-10 MED ORDER — VERAPAMIL HCL 2.5 MG/ML IV SOLN
INTRAVENOUS | Status: AC
  Filled 2024-11-10: qty 2

## 2024-11-10 MED ORDER — IOHEXOL 350 MG/ML SOLN
100.0000 mL | Freq: Once | INTRAVENOUS | Status: AC | PRN
  Administered 2024-11-10: 100 mL via INTRAVENOUS

## 2024-11-10 MED ORDER — DEXTROSE 50 % IV SOLN: 1.0000 | Freq: Once | INTRAVENOUS | Status: AC

## 2024-11-10 MED ORDER — DEXTROSE-SODIUM CHLORIDE 5-0.9 % IV SOLN: INTRAVENOUS | Status: DC

## 2024-11-10 MED ORDER — HEPARIN (PORCINE) IN NACL 1000-0.9 UT/500ML-% IV SOLN
INTRAVENOUS | Status: AC
  Filled 2024-11-10: qty 1000

## 2024-11-10 MED ORDER — CARVEDILOL 6.25 MG PO TABS
6.2500 mg | ORAL_TABLET | Freq: Two times a day (BID) | ORAL | Status: DC
  Administered 2024-11-10 – 2024-11-12 (×4): 6.25 mg via ORAL
  Filled 2024-11-10 (×4): qty 1

## 2024-11-10 MED ORDER — MIDAZOLAM HCL (PF) 2 MG/2ML IJ SOLN
INTRAMUSCULAR | Status: DC | PRN
  Administered 2024-11-10: 1 mg via INTRAVENOUS

## 2024-11-10 MED ORDER — ASPIRIN 81 MG PO CHEW
81.0000 mg | CHEWABLE_TABLET | ORAL | Status: AC
  Administered 2024-11-10: 81 mg via ORAL
  Filled 2024-11-10: qty 1

## 2024-11-10 MED ORDER — LIDOCAINE HCL (PF) 1 % IJ SOLN
INTRAMUSCULAR | Status: DC | PRN
  Administered 2024-11-10: 2 mL

## 2024-11-10 MED ORDER — PRASUGREL HCL 10 MG PO TABS
10.0000 mg | ORAL_TABLET | Freq: Every day | ORAL | Status: DC
  Administered 2024-11-11 – 2024-11-12 (×2): 10 mg via ORAL
  Filled 2024-11-10 (×2): qty 1

## 2024-11-10 MED ORDER — LIDOCAINE HCL 1 % IJ SOLN
INTRAMUSCULAR | Status: AC
  Filled 2024-11-10: qty 20

## 2024-11-10 MED ORDER — PRASUGREL HCL 10 MG PO TABS
ORAL_TABLET | ORAL | Status: DC | PRN
  Administered 2024-11-10: 60 mg via ORAL

## 2024-11-10 MED ORDER — ASPIRIN 81 MG PO CHEW
CHEWABLE_TABLET | ORAL | Status: AC
  Filled 2024-11-10: qty 3

## 2024-11-10 MED ORDER — FREE WATER
500.0000 mL | Freq: Once | Status: AC
  Administered 2024-11-10: 500 mL via ORAL
  Filled 2024-11-10: qty 500

## 2024-11-10 MED ORDER — SODIUM CHLORIDE 0.9% FLUSH: 3.0000 mL | INTRAVENOUS | Status: DC | PRN

## 2024-11-10 MED ORDER — DEXTROSE 50 % IV SOLN
INTRAVENOUS | Status: AC
  Filled 2024-11-10: qty 50

## 2024-11-10 MED ORDER — VERAPAMIL HCL 2.5 MG/ML IV SOLN
INTRAVENOUS | Status: DC | PRN
  Administered 2024-11-10: 2.5 mg via INTRA_ARTERIAL

## 2024-11-10 MED ORDER — SODIUM CHLORIDE 0.9 % IV SOLN: INTRAVENOUS | Status: DC

## 2024-11-10 MED ORDER — HEPARIN (PORCINE) 25000 UT/250ML-% IV SOLN
INTRAVENOUS | Status: AC
  Filled 2024-11-10: qty 250

## 2024-11-10 MED ORDER — SPIRONOLACTONE 12.5 MG HALF TABLET
12.5000 mg | ORAL_TABLET | Freq: Every day | ORAL | Status: DC
  Administered 2024-11-11 – 2024-11-12 (×2): 12.5 mg via ORAL
  Filled 2024-11-10 (×3): qty 1

## 2024-11-10 NOTE — ED Notes (Signed)
 RN to bedside for rounding; patient had opened applesauce container at bedside. Patient reportedly ate at 0400; denies fluids/food since.   Patient informed she is NPO.

## 2024-11-10 NOTE — Progress Notes (Signed)
 Heart Failure Stewardship Pharmacy Note  PCP: Carin Gauze, NP PCP-Cardiologist: None  HPI: Chelsey Wilson is a 59 y.o. female with CAD with history of NSTEMI s/p PCI, HTN, CKD stage llla, fibromyalgia who presented with chest pain and palpitations, found to have unstable narrow complex tachycardia.   On admission, BNP was not measured, HS-troponin was 703 trending up to 1732, AST 322, ALT 240, and lactic acid was 3.1. BP on admission was 75/57 mmHg and HR was 182 in SVT. She underwent DCCV to NSR with HR of 94 and BP of 128/85.  Pertinent cardiac history: LHC on 05/2016 showed 90% LAD stenosis treated with DES. TTE at that time noted LVEF of 45%. TTE this admission noted LVEF of 20-25%, G2DD, and mildly reduced RV function.  Pertinent Lab Values: Creat  Date Value Ref Range Status  07/28/2016 0.88 0.50 - 1.05 mg/dL Final    Comment:      For patients > or = 60 years of age: The upper reference limit for Creatinine is approximately 13% higher for people identified as African-American.      Creatinine, Ser  Date Value Ref Range Status  11/10/2024 0.79 0.44 - 1.00 mg/dL Final   BUN  Date Value Ref Range Status  11/10/2024 9 6 - 20 mg/dL Final  93/81/7975 15 8 - 27 mg/dL Final   Potassium  Date Value Ref Range Status  11/10/2024 3.8 3.5 - 5.1 mmol/L Final   Sodium  Date Value Ref Range Status  11/10/2024 141 135 - 145 mmol/L Final  04/13/2023 143 134 - 144 mmol/L Final   Magnesium   Date Value Ref Range Status  11/08/2024 1.9 1.7 - 2.4 mg/dL Final    Comment:    Performed at [facility], [address] [place], [place] [address]   Hgb A1c MFr Bld  Date Value Ref Range Status  11/09/2024 5.9 (H) 4.8 - 5.6 % Final    Comment:    (NOTE) Diagnosis of Diabetes The following HbA1c ranges recommended by the American Diabetes Association (ADA) may be used as an aid in the diagnosis of diabetes mellitus.  Hemoglobin             Suggested A1C NGSP%               Diagnosis  <5.7                   Non Diabetic  5.7-6.4                Pre-Diabetic  >6.4                   Diabetic  <7.0                   Glycemic control for                       adults with diabetes.     TSH  Date Value Ref Range Status  11/08/2024 1.670 0.350 - 4.500 uIU/mL Final    Comment:    Performed at [facility], [address] [place], [place] [address]  04/13/2023 2.240 0.450 - 4.500 uIU/mL Final    Vital Signs: Admission weight: Temp:  [97.8 F (36.6 C)-98.5 F (36.9 C)] 98.5 F (36.9 C) (01/16 0731) Pulse Rate:  [56-100] 58 (01/16 0731) Cardiac Rhythm: Sinus bradycardia;Atrial fibrillation (01/15 2334) Resp:  [9-20] 12 (01/16 0731) BP: (128-173)/(71-130) 152/77 (01/16 0731) SpO2:  [91 %-  100 %] 100 % (01/16 0731)  Intake/Output Summary (Last 24 hours) at 11/10/2024 0827 Last data filed at 11/09/2024 2017 Gross per 24 hour  Intake 1677.03 ml  Output --  Net 1677.03 ml    Current Heart Failure Medications:  Loop diuretic: none Beta-Blocker: carvedilol  3.125 mg BID ACEI/ARB/ARNI: none MRA: none SGLT2i: none Other: none  Prior to admission Heart Failure Medications:  none  Assessment: 1. 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 NYHA class II-III symptoms.  -Volume: Appears to be relatively euvolemic, will see if LVEDP performed on LHC today to inform better. Not currently requiring diuresis.  -Hemodynamics: BP is elevated. HR is now 60s in NSR.  -BB: Continue carvedilol  6.25 mg BID at this time. Can consider further titration after the remaining pillars of GDMT are added. -ACEI/ARB/ARNI: Add losartan  25 mg daily today. IF BP remains high tomorrow, would recommend transitioning to Entresto 24-26 mg BID. -MRA: Agree with adding spironolactone  12.5 mg daily. Would consider increasing to 25 mg daily prior to discharge. -SGLT2i: Consider  adding SGLT2i tomorrow pending copay. Very distant history of pyelonephritis noted, but no recurrence since and benefits likely outweigh risks.  Plan: 1) Medication changes recommended at this time: -Agree with adding losartan  and spironolactone . Can consider transition to Entresto and adding SGLT2i tomorrow if BP and creatinine are stable and medications are affordable.  2) Patient assistance: -Pending  3) Education: - Patient has been educated on current HF medications and potential additions to HF medication regimen - Patient verbalizes understanding that over the next few months, these medication doses may change and more medications may be added to optimize HF regimen - Patient has been educated on basic disease state pathophysiology and goals of therapy  Please do not hesitate to reach out with questions or concerns,  Novalyn Lajara, PharmD, CPP, BCPS, [facility] Heart Failure Pharmacist  Phone - [phone] 11/10/2024 11:07 AM

## 2024-11-10 NOTE — OR Nursing (Addendum)
 1700- care turned over to Elise Gladden from [facility] [employer]. Pt anxious moving around in bed hyperventilating. Pt reassured and worked on using breathing exercises to calm her breathing down. During band deflation patient became restless and moved her wrist. She bled and air returned to band and hematoma noted. Second tr band applied. Md notified. Pt voided 800 and 250 ml clear light yellow urine. Dr Florencio recommended holding previously ordered lasix . Pt intmittently curled her hand in ackward movement like cramping movement. K+ and Magnesium  level ordered. IV questionable on left hand ended up removing and unable to get second IV. Pt ranging between agitation and sitting up and complaining of shortness of breath, hyperventilation followed by cooperative state.  check of pt eyes, were reactive to light and equal. Slow infusion of ativan  given for anxiety, then pt became somulent. After giving ativan  she no longer spoke english consistently. She was able to give date of birth and responded to name but unable to answer orientation consistently. She was at times trying to crawl out of bed.  1800 rapid response called 1809 Code stroke called. Pt taken to CT. Dr Florencio aware of changes in status and he arrived to see patient.  Daughter called and will be arriving shortly.  75 daughter arrived Neurologist assessing via teleprompter During code stroke Dr Florencio reviewed two EKGS which were done post PCI.

## 2024-11-10 NOTE — Consult Note (Addendum)
 [facility] Consult Services   Patient Name:   Chelsey Wilson, Chelsey Wilson Date of Birth:   November 25, 1965 Identification Number:   MRN - [id number] Date of Service:   11/10/2024 18:28:33  Diagnosis:       R41.82 - Altered mental status, unspecified  Impression:      59 year old woman currently hospitalized with concern for NSTEMI status post cardiac catheterization with stenting, on whom a stroke alert was called for altered mental status. Had been agitated postprocedure with dyspnea. Received Ativan  and had decreased level of consciousness and altered mental status. Head CT showed age-indeterminate infarct with the right caudate head as well as subcortical white matter disease.    Not a candidate for IV thrombolytic based on last known well, and heparin  drip. Therapeutic heparin  administration. Symptoms could be representative of a postanesthesia effect, exacerbated by Ativan  use does have risk factor for stroke with recent NSTEMI and cardiac procedure.SABRA However, with the CT read of an age-indeterminate infarct, I recommended proceeding with CTA to rule out thrombus as a cause of her symptoms.  ++++++++++++++++++ ADDENDUM  Advanced Imaging: CTA Head and Neck Completed. CTP Completed.  LVO:No ++++++++++++++++++  Our recommendations are outlined below.  Recommendations:        Stroke/Telemetry Floor       Neuro Checks (Q4)       Bedside Swallow Eval       DVT Prophylaxis       IV Fluids, Normal Saline       Head of Bed 30 Degrees       Euglycemia and Avoid Hyperthermia (PRN Acetaminophen )       Follow-up pending CTA. If it is negative, monitor clinically. May obtain brain MRI to rule out stroke.  Sign Out:       Discussed with Emergency Department Provider    ------------------------------------------------------------------------------  Advanced Imaging: Advanced imaging has been ordered. Results pending.   Metrics: Last Known Well: 11/10/2024  12:00:00 Activation Time: 11/10/2024 18:28:33 Initial Response Time: 11/10/2024 18:34:19 Symptoms: Altered mental status. Initial patient interaction: 11/10/2024 18:42:10 NIHSS Assessment Completed: 11/10/2024 18:54:46 Patient is not a candidate for Thrombolytic. Thrombolytic Medical Decision: 11/10/2024 18:54:48 Patient was not deemed candidate for Thrombolytic because of following reasons: LKW outside 4.5 hr window. . Heparin  received within 48 hours, resulting in abnormally elevated PTT>40 .  CT Head: I personally reviewed all the CT images that were available to me and it showed: No definitive evidence of an acute intracranial process. There is a well-circumscribed hypodensity within the left caudate head would favor more of a chronic process. No hemorrhage.  Primary Provider Notified of Diagnostic Impression and Management Plan on: 11/10/2024 19:04:40 Spoke With: Agbata Able to Reach 11/10/2024 19:04:40    ------------------------------------------------------------------------------  History of Present Illness: Patient is a 59 year old Female.  Inpatient stroke alert was called for symptoms of Altered mental status. She is status post cardiac catheterization today, with clinical course notable for shortness of breath and agitation in the postop setting. Received Ativan  to help calm her down, and she became more altered and was not following command. They had head CT which showed age indeterminant infarct within the right caudate head. Stroke alert called for further assessment. She is unable to give detailed history due to her hypersomnolence.      Past Medical History:      Hypertension      Hyperlipidemia      Coronary Artery Disease unable to obtain due to:   Patient Is Obtunded/ Comatose  Medications:  Anticoagulant use:  Yes Heparin  GTT Antiplatelet use: Yes Aspirin  and prasugrel  Reviewed EMR for current medications  Allergies:   Allergies Unable To Obtain  Due To: Patient Is Obtunded/ Comatose  Social History: Unable To Obtain Due To Patient Status : Patient Is Obtunded/ Comatose  Family History:  Family History Cannot Be Obtained Because:Patient Is Obtunded/ Comatose  ROS : ROS Cannot Be Obtained Because:  Patient Is Obtunded/ Comatose  Past Surgical History: There Is Surgical History of:  Cardiac catheterization    Examination: BP(114/78), Pulse(82), 1A: Level of Consciousness - Requires repeated stimulation to arouse + 2 1B: Ask Month and Age - 1 Question Right + 1 1C: Blink Eyes & Squeeze Hands - Performs Both Tasks + 0 2: Test Horizontal Extraocular Movements - Normal + 0 3: Test Visual Fields - No Visual Loss + 0 4: Test Facial Palsy (Use Grimace if Obtunded) - Normal symmetry + 0 5A: Test Left Arm Motor Drift - Drift, hits bed + 2 5B: Test Right Arm Motor Drift - Drift, hits bed + 2 6A: Test Left Leg Motor Drift - Drift, but doesn't hit bed + 1 6B: Test Right Leg Motor Drift - Drift, but doesn't hit bed + 1 7: Test Limb Ataxia (FNF/Heel-Shin) - No Ataxia + 0 8: Test Sensation - Normal; No sensory loss + 0 9: Test Language/Aphasia - Normal; No aphasia + 0 10: Test Dysarthria - Mild-Moderate Dysarthria: Slurring but can be understood + 1 11: Test Extinction/Inattention - No abnormality + 0  NIHSS Score: 10  NIHSS Free Text : Language testing limited by mental status. When she is aroused was appropriate.  Pre-Morbid Modified Rankin Scale: Unable to assess  Spoke with : Agbata I reviewed the available imaging via [facility] and initiated discussion with the primary provider  This consult was conducted in real time using interactive audio and immunologist. Patient was informed of the technology being used for this visit and agreed to proceed. Patient located in hospital and provider located at home/office setting.   Patient is being evaluated for possible acute neurologic impairment and high probability of imminent or  life-threatening deterioration. I spent total of 45 minutes providing care to this patient, including time for face to face visit via telemedicine, review of medical records, imaging studies and discussion of findings with providers, the patient and/or family.    Dr Claudette Dates   [facility] For Inpatient follow-up with [facility] physician please call [facility] at [phone]. As we are not an outpatient service for any post hospital discharge needs please contact the hospital for assistance. If you have any questions for the [facility] physicians or need to reconsult for clinical or diagnostic changes please contact us  via [facility] at [phone].  Non-radiologist review of imaging performed to assist with emergent clinical decision-making. Remote physician workstations do not possess the same resolution, calibration, or diagnostic capabilities as hospital-based radiology reading stations, and formal radiologist read is necessary.   Signature : Claudette Dates

## 2024-11-10 NOTE — Consult Note (Signed)
 PHARMACY - ANTICOAGULATION CONSULT NOTE  Pharmacy Consult for Heparin   Indication: chest pain/ACS  Allergies[1]  Patient Measurements: Height: 5' (152.4 cm) Weight: 61.2 kg (134 lb 14.7 oz) IBW/kg (Calculated) : 45.5 HEPARIN  DW (KG): 58.2  Vital Signs: Temp: 98.5 F (36.9 C) (01/16 0731) Temp Source: Oral (01/16 0731) BP: 152/77 (01/16 0731) Pulse Rate: 58 (01/16 0731)  Labs: Recent Labs    11/08/24 2342 11/09/24 0330 11/09/24 2341 11/10/24 0438 11/10/24 0743  HGB 11.2* 10.5*  --  10.9*  --   HCT 34.3* 31.1*  --  33.3*  --   PLT 188 167  --  180  --   LABPROT 15.0  --   --   --   --   INR 1.1  --   --   --   --   HEPARINUNFRC  --   --  0.82*  --  0.50  CREATININE 1.32* 0.94  --  0.79  --     Estimated Creatinine Clearance: 61.9 mL/min (by C-G formula based on SCr of 0.79 mg/dL).   Medical History: Past Medical History:  Diagnosis Date   Bacteremia GNR 08/03/2013   Essential hypertension    HLD (hyperlipidemia)    Hypokalemia 08/02/2013   Pyelonephritis 08/02/2013   Sepsis (HCC) 08/03/2013    Medications:  (Not in a hospital admission)  Scheduled:   aspirin  EC  81 mg Oral Daily   carvedilol   3.125 mg Oral BID   insulin  aspart  0-15 Units Subcutaneous TID WC   Infusions:   heparin  550 Units/hr (11/10/24 0032)   PRN: acetaminophen  **OR** acetaminophen , HYDROcodone -acetaminophen , morphine  injection, ondansetron  **OR** ondansetron  (ZOFRAN ) IV Anti-infectives (From admission, onward)    None       Assessment: 59 y.o. female with medical history significant for CAD with history of NSTEMI s/p PCI, HTN, CKD stage llla, fibromyalgia being admitted following emergent DC cardioversion for unstable narrow complex tachyarrhythmia. Trop T trending up 40 > 1732. Hgb is around baseline. No DOAC PTA.   Date Time HL Rate/Comment 1/15 2341 0.82 Supratherapeutic on 650 units/hour 1/16 0743 0.50 Therapeutic x 1  Goal of Therapy:  Heparin  level 0.3-0.7  units/ml Monitor platelets by anticoagulation protocol: Yes   Plan:  HL therapeutic x 1 Continue heparin  infusion rate to 550 units/hour Check confirmatory heparin  level in 6 hours Monitor CBC and HL daily while on heparin  infusion  Thank you for involving pharmacy in this patient's care.   Will M. Lenon, PharmD, BCPS Clinical Pharmacist 11/10/2024 8:42 AM     [1]  Allergies Allergen Reactions   Metoprolol  Palpitations

## 2024-11-10 NOTE — Progress Notes (Signed)
 SPIRITUAL CARE AND COUNSELING CONSULT NOTE   Patient was being observed by medical staff. Chaplain checked for family and none were present. Chaplain asked nurse to page if family comes.  SPIRITUAL ENCOUNTER                                                                                                                                                                      Type of Visit: Initial Care provided to:: Pt not available Conversation partners present during encounter: Nurse Referral source: Code page Reason for visit: Code OnCall Visit: Yes   SPIRITUAL FRAMEWORK      GOALS       INTERVENTIONS        INTERVENTION OUTCOMES   Outcomes: Connection to spiritual care  SPIRITUAL CARE PLAN        If immediate needs arise, please contact [facility] 24 hour on call [phone]   Chelsey Wilson  11/10/2024 8:00 PM

## 2024-11-10 NOTE — CV Procedure (Signed)
 Brief cath PCI note Patient presented with chest pain echo with significant cardiomyopathy EF of 30 to 35% which is new  Inpatient cath Right radial approach  Left ventriculogram Severely depressed left ventricular function EF of around 30 to 35%  Coronary artery disease Left main large minor irregularities LAD large moderate disease Circumflex very large widely patent stent RCA 80% mid lesion TIMI-3 flow Right dominant  Intervention Successful PCI and stenting mid RCA 2.5 x 12 mm frontier Onyx Postdilated with a 3.0 x 8 mm [place] neo to 16 atm  Lesion reduced from 80-> 0% Patient to be maintained on prasugrel  x 6 months Aspirin  for at least 6 months  Patient tolerated procedure well No complications  Anticipate discharge within 23 hours  Full cath note to follow  Cara Lovelace MD Interventional cardiology 11/10/2024

## 2024-11-10 NOTE — Progress Notes (Signed)
 " Progress Note   Patient: Chelsey Wilson FMW:[id number] DOB: [birth year] DOA: 11/08/2024     0 DOS: the patient was seen and examined on 11/10/2024   Brief hospital course:  Chelsey Wilson is a 59 y.o. female with medical history significant for CAD with history of NSTEMI s/p PCI, HTN, CKD stage llla, fibromyalgia being admitted following emergent DC cardioversion for unstable narrow complex tachyarrhythmia.  Patient who has a history of metoprolol  intolerance was seen earlier in the day by her PCP on 1/14 as an urgent visit for palpitations.  She was referred to cardiology, however later in the day she had ongoing symptoms going on to develop chest pain and shortness of breath prompting the visit to the ED. On arrival, she was tachycardic to 182 and hypotensive to 75/57 with EKG showing sinus tachycardia with short PR.  She was afebrile, tachypneic to 22 with O2 sat 98% on room air. She underwent DC cardioversion at 120 J converting to sinus rhythm at 94 with BP 128/85. Other workup in the ED significant for the following: - Troponin 40, proBNP 2017, D-dimer 1.16 - CBC unremarkable with baseline hemoglobin of 11.2. - CMP notable for Mild hyperglycemia of 214, creatinine 1.32 up from baseline of 1.15, transaminitis with AST/ALT of 305/268, anion gap of 18 and bicarb of 17 -Magnesium  1.9 and potassium 4.7 CTA PE protocol negative for PE but showing extensive multivessel coronary artery calcification as well as mild atherosclerotic calcification of the thoracic aorta among other nonacute findings.   Patient was treated with LR bolus fentanyl  for pain Zofran  Admission requested        Assessment and Plan:   * Narrow complex tachycardia, unstable, s/p DC cardioversion 11/08/2024 Patient presented to the ER for evaluation of chest pain and shortness of breath, she was tachycardic with heart rate in the 180s and systolic blood pressure in the 70s. Patient converted to sinus rhythm following electrical  cardioversion Resolution in symptoms s/p cardioversion Appreciate cardiology input Recommended to continue beta-blockers 2D echocardiogram was done and showed an LVEF of 20 to 25% with severely decreased LV function and global hypokinesis.  Grade 2 diastolic dysfunction Continuous cardiac monitoring    CAD S/P percutaneous coronary angioplasty Elevated troponin Extensive multivessel coronary artery calcification on CTA chest Ischemic cardiomyopathy (LVEF 20 to 25%) Patient presents with chest pain likely related to tachyarrhythmia, resolved post cardioversion Troponin peaked at 1491 and shows a downward trend Elevated troponin could be secondary to demand ischemia from tachyarrhythmias to rule out myocardial injury with known coronary artery disease Continue heparin  drip Plan for left heart cath by cardiology Patient has been started on goal-directed medical therapy for ischemic cardiomyopathy with recent LVEF of 20 to 25% and is currently on carvedilol , spironolactone  and losartan .  Last 2D echocardiogram from 2017 showed an LVEF of 45%    Hyperglycemia At risk for diabetes mellitus No documented history of diabetes mellitus Blood sugar slightly elevated at 214 Hemoglobin A1c is 5.9 Continue consistent carbohydrate diet   High anion gap metabolic acidosis Lactic acidosis Suspect related to tissue hypoxia from hypotension caused by unstable tachyarrhythmia Resolved with IV fluid hydration   Transaminitis Possibly related to mild ischemia related to hypotension Right upper quadrant ultrasound showed mild gallbladder wall thickening, predominantly along the hepatic surface, measuring up to 4 mm.     Essential hypertension, benign Continue carvedilol  losartan  and spironolactone    Chronic kidney disease, stage 3a (HCC) Renal function near baseline   Fibromyalgia Continue amitriptyline  once verified  Subjective: Had an episode of hypoglycemia this  morning  Physical Exam: Vitals:   11/10/24 0930 11/10/24 1022 11/10/24 1356 11/10/24 1533  BP:  (!) 166/74  137/88  Pulse: 80 68  78  Resp: 19 14  17   Temp:  98.4 F (36.9 C)  97.8 F (36.6 C)  TempSrc:  Oral  Oral  SpO2: 100% 94% 99% 98%  Weight:      Height:       Vitals and nursing note reviewed.  Constitutional:      General: She is not in acute distress. HENT:     Head: Normocephalic and atraumatic.  Cardiovascular:     Rate and Rhythm: Normal rate and regular rhythm.     Heart sounds: Normal heart sounds.  Pulmonary:     Effort: Pulmonary effort is normal.     Breath sounds: Normal breath sounds.  Abdominal:     Palpations: Abdomen is soft.     Tenderness: There is no abdominal tenderness.  Neurological:     Mental Status: Mental status is at baseline.      Data Reviewed: Labs reviewed.  Hemoglobin 10.9, hematocrit 33, hemoglobin A1c 5.9 Labs reviewed  Family Communication: Plan of care discussed with patient in detail at the bedside.  She verbalizes understanding and agrees with the plan of care.     Disposition: Status is: Inpatient Remains inpatient appropriate because: Possible discharge in a.m.  Planned Discharge Destination: Home    Time spent: 50 minutes  Author: Aimee Somerset, MD 11/10/2024 3:42 PM  For on call review [email or web address].  "

## 2024-11-10 NOTE — Consult Note (Signed)
 PHARMACY - ANTICOAGULATION CONSULT NOTE  Pharmacy Consult for Heparin   Indication: chest pain/ACS  Allergies[1]  Patient Measurements: Height: 5' (152.4 cm) Weight: 61.2 kg (134 lb 14.7 oz) IBW/kg (Calculated) : 45.5 HEPARIN  DW (KG): 58.2  Vital Signs: Temp: 97.9 F (36.6 C) (01/15 2337) Temp Source: Oral (01/15 2337) BP: 138/72 (01/15 2245) Pulse Rate: 59 (01/15 2337)  Labs: Recent Labs    11/08/24 2342 11/09/24 0330 11/09/24 2341  HGB 11.2* 10.5*  --   HCT 34.3* 31.1*  --   PLT 188 167  --   LABPROT 15.0  --   --   INR 1.1  --   --   HEPARINUNFRC  --   --  0.82*  CREATININE 1.32* 0.94  --     Estimated Creatinine Clearance: 52.7 mL/min (by C-G formula based on SCr of 0.94 mg/dL).   Medical History: Past Medical History:  Diagnosis Date   Bacteremia GNR 08/03/2013   Essential hypertension    HLD (hyperlipidemia)    Hypokalemia 08/02/2013   Pyelonephritis 08/02/2013   Sepsis (HCC) 08/03/2013    Medications:  (Not in a hospital admission)  Scheduled:   aspirin  EC  81 mg Oral Daily   carvedilol   3.125 mg Oral BID   insulin  aspart  0-15 Units Subcutaneous TID WC   Infusions:   heparin  650 Units/hr (11/09/24 1628)   PRN: acetaminophen  **OR** acetaminophen , HYDROcodone -acetaminophen , morphine  injection, ondansetron  **OR** ondansetron  (ZOFRAN ) IV Anti-infectives (From admission, onward)    None       Assessment: 59 y.o. female with medical history significant for CAD with history of NSTEMI s/p PCI, HTN, CKD stage llla, fibromyalgia being admitted following emergent DC cardioversion for unstable narrow complex tachyarrhythmia. Trop T trending up 40 > 1732. Hgb is around baseline. No DOAC PTA.   Date Time HL Rate/Comment 1/15 2341 0.82 Supratherapeutic on 650 units/hour  Goal of Therapy:  Heparin  level 0.3-0.7 units/ml Monitor platelets by anticoagulation protocol: Yes   Plan:  HL supratherapeutic, no signs/symptoms of bleeding noted Decrease  heparin  infusion rate to 550 units/hour Check heparin  level 6 hours after rate change Monitor CBC and HL daily while on heparin  infusion  Thank you for involving pharmacy in this patient's care.   Damien Napoleon, PharmD Clinical Pharmacist 11/10/2024 12:08 AM        [1]  Allergies Allergen Reactions   Metoprolol  Palpitations

## 2024-11-10 NOTE — ED Notes (Signed)
 Report given to special recovery RN

## 2024-11-10 NOTE — Progress Notes (Signed)
"  ° °      Overnight   NAME: Chelsey Wilson MRN: [id number] DOB : 1965/12/29    Date of Service   11/10/2024   HPI/Events of Note   HPI:     Overnight:    Physical Exam    Interventions/ Plan   X X X    Updates     Chelsey Wilson BSN RN CCRN AGACNP-BC Acute Care Nurse Practitioner [facility] [place]  "

## 2024-11-10 NOTE — Progress Notes (Cosign Needed Addendum)
 [facility] CLINIC CARDIOLOGY PROGRESS NOTE   Patient ID: Chelsey Wilson MRN: [id number] DOB/AGE: 01-May-1966 66 y.o.  Admit date: 11/08/2024 Referring Physician Dr. Delayne Lis  Primary Physician Carin, Jeoffrey, NP  Primary Cardiologist Dr. Fernand (no recent visits) Reason for Consultation SVT, NSTEMI  HPI: Chelsey Wilson is a 59 y.o. female with a past medical history of coronary artery disease s/p DES to LCx 2017, hypertension, hyperlipidemia who presented to the ED on 11/08/2024 for SOB, CP, palpitations. Cardiology was consulted for further evaluation.   Interval History:  -Patient seen and examined this AM, resting in hospital bed.  -Denies any CP, SOB, palpitations. No recurrence of SVT on tele.  -  Review of systems complete and found to be negative unless listed above   Vitals:   11/09/24 2337 11/10/24 0145 11/10/24 0215 11/10/24 0731  BP:   (!) 149/71 (!) 152/77  Pulse: (!) 59 (!) 57 63 (!) 58  Resp:  10 14 12   Temp: 97.9 F (36.6 C)  97.8 F (36.6 C) 98.5 F (36.9 C)  TempSrc: Oral  Oral Oral  SpO2:  97% 96% 100%  Weight:      Height:         Intake/Output Summary (Last 24 hours) at 11/10/2024 9060 Last data filed at 11/09/2024 2017 Gross per 24 hour  Intake 1677.03 ml  Output --  Net 1677.03 ml     PHYSICAL EXAM General: Well appearing female, well nourished, in no acute distress. HEENT: Normocephalic and atraumatic. Neck: No JVD.  Lungs: Normal respiratory effort on room air. Clear bilaterally to auscultation. No wheezes, crackles, rhonchi.  Heart: HRRR. Normal S1 and S2 without gallops or murmurs. Radial & DP pulses 2+ bilaterally. Abdomen: Non-distended appearing.  Msk: Normal strength and tone for age. Extremities: No clubbing, cyanosis or edema.   Neuro: Alert and oriented X 3. Psych: Mood appropriate, affect congruent.    LABS: Basic Metabolic Panel: Recent Labs    11/08/24 2342 11/09/24 0330 11/10/24 0438  NA 140 143 141  K 4.7 3.3* 3.8  CL  105 109 108  CO2 17* 20* 23  GLUCOSE 214* 72 85  BUN 20 17 9   CREATININE 1.32* 0.94 0.79  CALCIUM  8.6* 8.5* 8.4*  MG 1.9  --   --    Liver Function Tests: Recent Labs    11/08/24 2342 11/09/24 0330  AST 305* 322*  ALT 268* 240*  ALKPHOS 101 86  BILITOT 0.7 0.4  PROT 6.9 6.3*  ALBUMIN 4.0 3.7   No results for input(s): LIPASE, AMYLASE in the last 72 hours. CBC: Recent Labs    11/08/24 2342 11/09/24 0330 11/10/24 0438  WBC 5.9 6.3 4.8  NEUTROABS 3.8  --   --   HGB 11.2* 10.5* 10.9*  HCT 34.3* 31.1* 33.3*  MCV 84.7 83.6 83.9  PLT 188 167 180   Cardiac Enzymes: No results for input(s): CKTOTAL, CKMB, CKMBINDEX, TROPONINIHS in the last 72 hours. BNP: No results for input(s): BNP in the last 72 hours. D-Dimer: Recent Labs    11/08/24 2342  DDIMER 1.16*   Hemoglobin A1C: Recent Labs    11/09/24 0330  HGBA1C 5.9*   Fasting Lipid Panel: No results for input(s): CHOL, HDL, LDLCALC, TRIG, CHOLHDL, LDLDIRECT in the last 72 hours. Thyroid Function Tests: Recent Labs    11/08/24 2342  TSH 1.670   Anemia Panel: No results for input(s): VITAMINB12, FOLATE, FERRITIN, TIBC, IRON, RETICCTPCT in the last 72 hours.  ECHOCARDIOGRAM COMPLETE Result Date: 11/09/2024  ECHOCARDIOGRAM REPORT   Patient Name:   Chelsey Wilson Date of Exam: 11/09/2024 Medical Rec #:  [id number]    Height:       60.0 in Accession #:    [id number]   Weight:       134.9 lb Date of Birth:  Jun 14, 1966   BSA:          1.579 m Patient Age:    58 years     BP:           128/103 mmHg Patient Gender: F            HR:           83 bpm. Exam Location:  [facility] Procedure: 2D Echo, Cardiac Doppler and Color Doppler (Both Spectral and Color            Flow Doppler were utilized during procedure). Indications:     Elevated troponin  History:         Patient has prior history of Echocardiogram examinations, most                  recent 05/28/2016. Risk Factors:Hypertension and  Dyslipidemia.                  Sepsis.  Sonographer:     Christopher Furnace Referring Phys:  [id number] DELAYNE LULLA SOLIAN Diagnosing Phys: Cara JONETTA Lovelace MD IMPRESSIONS  1. Left ventricular ejection fraction, by estimation, is 20 to 25%. The left ventricle has severely decreased function. The left ventricle demonstrates global hypokinesis. The left ventricular internal cavity size was moderately to severely dilated. There is moderate asymmetric left ventricular hypertrophy of the inferior and basal segments. Left ventricular diastolic parameters are consistent with Grade II diastolic dysfunction (pseudonormalization).  2. Right ventricular systolic function is mildly reduced. The right ventricular size is mildly enlarged. Mildly increased right ventricular wall thickness.  3. Left atrial size was mildly dilated.  4. Right atrial size was mildly dilated.  5. The mitral valve is grossly normal. Severe mitral valve regurgitation.  6. The aortic valve is calcified. Aortic valve regurgitation is trivial. Aortic valve sclerosis/calcification is present, without any evidence of aortic stenosis. FINDINGS  Left Ventricle: Left ventricular ejection fraction, by estimation, is 20 to 25%. The left ventricle has severely decreased function. The left ventricle demonstrates global hypokinesis. Strain was performed and the global longitudinal strain is indeterminate. The left ventricular internal cavity size was moderately to severely dilated. There is moderate asymmetric left ventricular hypertrophy of the inferior and basal segments. Left ventricular diastolic parameters are consistent with Grade II diastolic dysfunction (pseudonormalization). Right Ventricle: The right ventricular size is mildly enlarged. Mildly increased right ventricular wall thickness. Right ventricular systolic function is mildly reduced. Left Atrium: Left atrial size was mildly dilated. Right Atrium: Right atrial size was mildly dilated. Pericardium: There is no  evidence of pericardial effusion. Mitral Valve: The mitral valve is grossly normal. Severe mitral valve regurgitation. MV peak gradient, 4.2 mmHg. The mean mitral valve gradient is 2.0 mmHg. Tricuspid Valve: The tricuspid valve is grossly normal. Tricuspid valve regurgitation is mild. Aortic Valve: The aortic valve is calcified. Aortic valve regurgitation is trivial. Aortic valve sclerosis/calcification is present, without any evidence of aortic stenosis. Aortic valve mean gradient measures 1.0 mmHg. Aortic valve peak gradient measures 3.0 mmHg. Aortic valve area, by VTI measures 3.06 cm. Pulmonic Valve: The pulmonic valve was not well visualized. Pulmonic valve regurgitation is not visualized. Aorta: The aortic root was not well visualized.  IAS/Shunts: No atrial level shunt detected by color flow Doppler. Additional Comments: 3D was performed not requiring image post processing on an independent workstation and was indeterminate.  LEFT VENTRICLE PLAX 2D LVIDd:         5.50 cm      Diastology LVIDs:         5.10 cm      LV e' medial:    5.87 cm/s LV PW:         1.40 cm      LV E/e' medial:  19.4 LV IVS:        1.10 cm      LV e' lateral:   4.35 cm/s LVOT diam:     2.10 cm      LV E/e' lateral: 26.2 LV SV:         42 LV SV Index:   27 LVOT Area:     3.46 cm  LV Volumes (MOD) LV vol d, MOD A2C: 112.0 ml LV vol d, MOD A4C: 138.0 ml LV vol s, MOD A2C: 84.4 ml LV vol s, MOD A4C: 120.0 ml LV SV MOD A2C:     27.6 ml LV SV MOD A4C:     138.0 ml LV SV MOD BP:      22.0 ml RIGHT VENTRICLE RV Basal diam:  2.40 cm RV Mid diam:    1.70 cm LEFT ATRIUM             Index        RIGHT ATRIUM           Index LA diam:        4.20 cm 2.66 cm/m   RA Area:     10.60 cm LA Vol (A2C):   44.7 ml 28.31 ml/m  RA Volume:   20.50 ml  12.98 ml/m LA Vol (A4C):   49.2 ml 31.16 ml/m LA Biplane Vol: 48.7 ml 30.84 ml/m  AORTIC VALVE AV Area (Vmax):    2.72 cm AV Area (Vmean):   3.11 cm AV Area (VTI):     3.06 cm AV Vmax:           86.50  cm/s AV Vmean:          52.900 cm/s AV VTI:            0.137 m AV Peak Grad:      3.0 mmHg AV Mean Grad:      1.0 mmHg LVOT Vmax:         67.90 cm/s LVOT Vmean:        47.500 cm/s LVOT VTI:          0.121 m LVOT/AV VTI ratio: 0.88  AORTA Ao Root diam: 3.10 cm MITRAL VALVE MV Area (PHT): 5.09 cm      SHUNTS MV Area VTI:   1.67 cm      Systemic VTI:  0.12 m MV Peak grad:  4.2 mmHg      Systemic Diam: 2.10 cm MV Mean grad:  2.0 mmHg MV Vmax:       1.03 m/s MV Vmean:      71.1 cm/s MV Decel Time: 149 msec MR Peak grad:   131.3 mmHg MR Mean grad:   79.0 mmHg MR Vmax:        573.00 cm/s MR Vmean:       416.0 cm/s MR PISA:        6.28 cm MR PISA Radius: 1.00 cm MV E velocity: 114.00 cm/s  MV A velocity: 87.50 cm/s MV E/A ratio:  1.30 Cara JONETTA Lovelace MD Electronically signed by Cara JONETTA Lovelace MD Signature Date/Time: 11/09/2024/5:29:15 PM    Final    US  Abdomen Limited RUQ (LIVER/GB) Result Date: 11/09/2024 EXAM: Right Upper Quadrant Abdominal Ultrasound 11/09/2024 08:49:06 AM TECHNIQUE: Real-time ultrasonography of the right upper quadrant of the abdomen was performed. COMPARISON: 08/03/2013 CLINICAL HISTORY: Transaminitis FINDINGS: LIVER: Normal echogenicity. No intrahepatic biliary ductal dilatation. No evidence of mass. Hepatopetal flow in the portal vein. BILIARY SYSTEM: Negative sonographic Murphy's sign. Mild wall thickening of the gallbladder, predominantly along the hepatic surface, measuring up to 4 mm. No cholelithiasis. No pericholecystic fluid. The common bile duct is within normal limits measuring 3.5 mm. No changes of acute cholecystitis. OTHER: No right upper quadrant ascites. IMPRESSION: 1. Mild gallbladder wall thickening, predominantly along the hepatic surface, measuring up to 4 mm. In the absence of gallstones, pericholecystic fluid, and a sonographic murphy's sign, this may be due to acute hepatitis, hypoproteinemia, or volume overload from either CHF or renal failure. Electronically signed by:  Rogelia Myers MD 11/09/2024 09:00 AM EST RP Workstation: [id number]   CT Angio Chest PE W and/or Wo Contrast Result Date: 11/09/2024 EXAM: CTA CHEST 11/09/2024 01:36:21 AM TECHNIQUE: CTA of the chest was performed after the administration of intravenous contrast. Multiplanar reformatted images are provided for review. MIP images are provided for review. Automated exposure control, iterative reconstruction, and/or weight based adjustment of the mA/kV was utilized to reduce the radiation dose to as low as reasonably achievable. COMPARISON: None available. CLINICAL HISTORY: Pulmonary embolism (PE) high probability. Chest pain, dyspnea. FINDINGS: PULMONARY ARTERIES: Pulmonary arteries are adequately opacified for evaluation. No acute pulmonary embolus. Main pulmonary artery is normal in caliber. MEDIASTINUM: Extensive multivessel coronary artery calcification. Global cardiac size is within normal limits though left ventricular dilation is noted. Mild atherosclerotic calcification within the thoracic aorta. LYMPH NODES: No mediastinal, hilar or axillary lymphadenopathy. LUNGS AND PLEURA: The lungs are without acute process. No focal consolidation or pulmonary edema. No evidence of pleural effusion or pneumothorax. UPPER ABDOMEN: Small hiatal hernia. SOFT TISSUES AND BONES: No acute bone or soft tissue abnormality. RAF SCORE: Aortic atherosclerosis (ICD-10: I70.0). No pulmonary embolism. IMPRESSION: 1. No pulmonary embolism. 2. Extensive multivessel coronary artery calcification and mild atherosclerotic calcification of the thoracic aorta. 3. Left ventricular dilation. 4. Small hiatal hernia. 5. RAF score includes aortic atherosclerosis (ICD10-I70.0) Electronically signed by: Dorethia Molt MD 11/09/2024 01:45 AM EST RP Workstation: [id number]   DG Chest 1 View Result Date: 11/08/2024 EXAM: 2 VIEW(S) XRAY OF THE CHEST 11/08/2024 11:29:00 PM COMPARISON: 04/01/2023 CLINICAL HISTORY: Chest pain. Pt reported chest pain  shortness of breath that began today. Pt denies cough or congestion. Pts only cardiac hx HTN. FINDINGS: LINES, TUBES AND DEVICES: Pacer pads on left chest. LUNGS AND PLEURA: No focal pulmonary opacity. No pleural effusion. No pneumothorax. HEART AND MEDIASTINUM: No acute abnormality of the cardiac and mediastinal silhouettes. BONES AND SOFT TISSUES: No acute osseous abnormality. IMPRESSION: 1. No acute cardiopulmonary process. Electronically signed by: Morgane Naveau MD 11/08/2024 11:32 PM EST RP Workstation: [id number]     ECHO as above  TELEMETRY (personally reviewed): sinus rhythm 50-60s PVCs  EKG (personally reviewed): SVT rate 182 bpm  DATA reviewed by me 11/10/24: last 24h vitals tele labs imaging I/O, hospitalist progress note  Principal Problem:   Narrow complex tachycardia, unstable, s/p DC cardioversion 11/08/2024 Active Problems:   Essential hypertension, benign   Fibromyalgia   CAD S/P percutaneous  coronary angioplasty   Transaminitis   High anion gap metabolic acidosis   Chronic kidney disease, stage 3a (HCC)   Hyperglycemia    ASSESSMENT AND PLAN: Chelsey Wilson is a 59 y.o. female with a past medical history of coronary artery disease s/p DES to LCx 2017, hypertension, hyperlipidemia who presented to the ED on 11/08/2024 for SOB, CP, palpitations. Cardiology was consulted for further evaluation.   # NSTEMI # Dilated cardiomyopathy # Supraventricular tachycardia # Coronary artery disease Patient presented to the ED with complaints of shortness of breath, chest pain, palpitations.  Found to be in SVT and was cardioverted in the ED.  Troponins have trended 40 > 703 > 1732 > 1491 > 1258. Echo this admission with EF 20-25%, global hypokinesis, moderate to severe LV dilation, moderate asymmetric LVH of inferior and basal segments, grade II diastolic dysfunction, moderate RV enlargement with mildly reduced function, severe MR.  -Discussed the risks and benefits of proceeding with  LHC for further evaluation with the patient.  He is agreeable to proceed.  NPO until LHC this afternoon (11/10/2024) with Dr. Florencio.  Written consent will be obtained.  Further recommendations following LHC. - Continue IV heparin  infusion. Likely DC after LHC. - Continue aspirin  81 mg daily.  Will start Crestor  20 mg daily. - Given reduction in EF and overall elevated BP, will start losartan  25 mg daily, spironolactone  12.5 mg daily and increase carvedilol  to 12.5 mg twice daily.  Will plan to continue GDMT titration while she is admitted.  Will request pharmacy to complete cost checks on Entresto and SGLT2 inhibitors.  This patient's case was discussed and created with Dr. Florencio and he is in agreement.  Signed:  Danita Bloch, PA-C  11/10/2024, 9:39 AM [facility]

## 2024-11-10 NOTE — Significant Event (Signed)
 Rapid response was called for change in mental status following a procedure. Patient is s/p Left heart Cath and while in recovery was noted to be agitated and restless. She received a dose of IV Ativan  Code stroke was called due to change in mental status and patient had an urgent CT scan of the head to rule out a bleed.  CT head without contrast showed age indeterminate infarct in the right caudate head. ASPECTS 9/10. Progression of subcortical white matter hypoattenuation since the prior study. Seen in consultation by neurology and a stat CTA was recommended. Discussed with neurology who recommends to hold off on MRI and monitor her mental status. If no improvement then an MRI can be obtained. Patient was seen and examined at the bedside, awake but slow to respond. Responds appropriately. Daughter at the bedside. Able to move all extremities  HEENT - Pale conjunctiva, anicteric sclera Neck - Supple, No JVD Lungs - BLAE CVS - RRR S1,S2 Abd - Bs +, soft,non tender, non distended Ext - No pedal edema CNS - Able to move all extremities  A/P AMS ?? Medication induced R/O Acute CVA. CTA Pending. Neurology consult ?? Hypoglycemia - Continue dextrose  containing fluids Bedside swallow evaluation when able   CAD Ischemic Cardiomyopathy S/p stent angioplasty Continue Carvedilol ,Spironolactone  and Losartan    Discussed plan of care with patient's daughter at the bedside. All questions and answers have been addressed. Patient signed out to night team  CC - 60 minutes

## 2024-11-11 ENCOUNTER — Other Ambulatory Visit: Payer: Self-pay

## 2024-11-11 LAB — GLUCOSE, CAPILLARY
Glucose-Capillary: 83 mg/dL (ref 70–99)
Glucose-Capillary: 73 mg/dL (ref 70–99)
Glucose-Capillary: 138 mg/dL — ABNORMAL HIGH (ref 70–99)
Glucose-Capillary: 105 mg/dL — ABNORMAL HIGH (ref 70–99)
Glucose-Capillary: 110 mg/dL — ABNORMAL HIGH (ref 70–99)

## 2024-11-11 LAB — CBC
HCT: 34.3 % — ABNORMAL LOW (ref 36.0–46.0)
Hemoglobin: 11.6 g/dL — ABNORMAL LOW (ref 12.0–15.0)
MCH: 27.4 pg (ref 26.0–34.0)
MCHC: 33.8 g/dL (ref 30.0–36.0)
MCV: 81.1 fL (ref 80.0–100.0)
Platelets: 202 K/uL (ref 150–400)
RBC: 4.23 MIL/uL (ref 3.87–5.11)
RDW: 14 % (ref 11.5–15.5)
WBC: 4.8 K/uL (ref 4.0–10.5)
nRBC: 0 % (ref 0.0–0.2)

## 2024-11-11 LAB — BASIC METABOLIC PANEL WITH GFR
Anion gap: 11 (ref 5–15)
BUN: 15 mg/dL (ref 6–20)
CO2: 24 mmol/L (ref 22–32)
Calcium: 9.2 mg/dL (ref 8.9–10.3)
Chloride: 105 mmol/L (ref 98–111)
Creatinine, Ser: 0.98 mg/dL (ref 0.44–1.00)
GFR, Estimated: >60 mL/min
Glucose, Bld: 105 mg/dL — ABNORMAL HIGH (ref 70–99)
Potassium: 3.8 mmol/L (ref 3.5–5.1)
Sodium: 140 mmol/L (ref 135–145)

## 2024-11-11 LAB — MRSA NEXT GEN BY PCR, NASAL: MRSA by PCR Next Gen: DETECTED — AB

## 2024-11-11 MED ORDER — MUPIROCIN 2 % EX OINT
1.0000 | TOPICAL_OINTMENT | Freq: Two times a day (BID) | CUTANEOUS | Status: DC
  Administered 2024-11-11 – 2024-11-12 (×4): 1 via NASAL
  Filled 2024-11-11: qty 22

## 2024-11-11 NOTE — Progress Notes (Signed)
 Per provider discontinue NIH assessments.

## 2024-11-11 NOTE — Progress Notes (Signed)
 " Progress Note   Patient: Chelsey Wilson FMW:[id number] DOB: [birth year] DOA: 11/08/2024     1 DOS: the patient was seen and examined on 11/11/2024   Brief hospital course:  Chelsey Wilson is a 59 y.o. female with medical history significant for CAD with history of NSTEMI s/p PCI, HTN, CKD stage llla, fibromyalgia being admitted following emergent DC cardioversion for unstable narrow complex tachyarrhythmia.  Patient who has a history of metoprolol  intolerance was seen earlier in the day by her PCP on 1/14 as an urgent visit for palpitations.  She was referred to cardiology, however later in the day she had ongoing symptoms going on to develop chest pain and shortness of breath prompting the visit to the ED. On arrival, she was tachycardic to 182 and hypotensive to 75/57 with EKG showing sinus tachycardia with short PR.  She was afebrile, tachypneic to 22 with O2 sat 98% on room air. She underwent DC cardioversion at 120 J converting to sinus rhythm at 94 with BP 128/85. Other workup in the ED significant for the following: - Troponin 40, proBNP 2017, D-dimer 1.16 - CBC unremarkable with baseline hemoglobin of 11.2. - CMP notable for Mild hyperglycemia of 214, creatinine 1.32 up from baseline of 1.15, transaminitis with AST/ALT of 305/268, anion gap of 18 and bicarb of 17 -Magnesium  1.9 and potassium 4.7 CTA PE protocol negative for PE but showing extensive multivessel coronary artery calcification as well as mild atherosclerotic calcification of the thoracic aorta among other nonacute findings.   Patient was treated with LR bolus fentanyl  for pain Zofran  Admission requested    01/17 -events of last night noted.  Patient was said to have been restless and agitated post left heart cath and received Ativan  resulting in worsening mental status changes.  Code stroke was called and patient was assessed by neurology.  Initial CT scan of the head showed age indeterminate infarct in the right caudate head.   Patient was evaluated by neurology who recommended a CT angiogram of the head and neck.  She had a CT angiogram which did not show any LVO.      Assessment and Plan:   Acute Metabolic Encephalopathy Acute CVA ruled out Most likely medication induced.  Patient has received benzodiazepine and fentanyl  prior to procedure Patient appears to be back to her baseline mental status and is awake, alert and oriented x 3   * Narrow complex tachycardia, unstable, s/p DC cardioversion 11/08/2024 Patient presented to the ER for evaluation of chest pain and shortness of breath, she was tachycardic with heart rate in the 180s and systolic blood pressure in the 70s. Patient converted to sinus rhythm following electrical cardioversion Resolution in symptoms s/p cardioversion Appreciate cardiology input Recommended to continue beta-blockers 2D echocardiogram was done and showed an LVEF of 20 to 25% with severely decreased LV function and global hypokinesis.  Grade 2 diastolic dysfunction Continuous cardiac monitoring Check and supplement electrolytes     CAD S/P percutaneous coronary angioplasty NSTEMI Extensive multivessel coronary artery calcification on CTA chest Ischemic cardiomyopathy (LVEF 20 to 25%) Patient presented with chest pain likely related to tachyarrhythmia, resolved post cardioversion Troponin peaked at 1732 and shows a downward trend Elevated troponin was initially thought to be secondary to demand ischemia from tachyarrhythmias but given patient's known history of coronary artery disease and left heart cath findings she appears to have had a non-ST elevation MI.   Patient is status post  left heart cath which showed an 80% RCA mid lesion,  TIMI -3 flow  and is status post successful PCI and stenting of the mid RCA Patient is off heparin  drip Patient has been started on goal-directed medical therapy for ischemic cardiomyopathy with recent LVEF of 20 to 25% and is currently on  carvedilol , spironolactone  and losartan .  Last 2D echocardiogram from 2017 showed an LVEF of 45%     Hyperglycemia At risk for diabetes mellitus No documented history of diabetes mellitus Episodes of hypoglycemia Hemoglobin A1c is 5.9 Continue consistent carbohydrate diet Will discharge home on Metformin    High anion gap metabolic acidosis Lactic acidosis Suspect related to tissue hypoxia from hypotension caused by unstable tachyarrhythmia Resolved with IV fluid hydration    Transaminitis Possibly related to mild ischemia related to hypotension Right upper quadrant ultrasound showed mild gallbladder wall thickening, predominantly along the hepatic surface, measuring up to 4 mm.      Essential hypertension, benign Continue carvedilol  losartan  and spironolactone     Chronic kidney disease, stage 3a (HCC) Renal function near baseline    Fibromyalgia Continue amitriptyline  once verified         Subjective: Patient is seen and examined at the bedside. Appears to be back to her baseline  Physical Exam: Vitals:   11/11/24 1145 11/11/24 1200 11/11/24 1300 11/11/24 1400  BP: 128/66 124/70 135/66 135/72  Pulse: 72 73 65 72  Resp: 13 16 17 19   Temp:  98 F (36.7 C)    TempSrc:  Oral    SpO2: 99% 99% 100% 100%  Weight:      Height:       Vitals and nursing note reviewed.  Constitutional:      General: She is not in acute distress. HENT:     Head: Normocephalic and atraumatic.  Cardiovascular:     Rate and Rhythm: Normal rate and regular rhythm.     Heart sounds: Normal heart sounds.  Pulmonary:     Effort: Pulmonary effort is normal.     Breath sounds: Normal breath sounds.  Abdominal:     Palpations: Abdomen is soft.     Tenderness: There is no abdominal tenderness.  Neurological:     Mental Status: Mental status is at baseline.    Data Reviewed:  There are no new results to review at this time.  Family Communication: Plan of care was discussed with  patient's daughter, Ms Chelsey Wilson over the phone.  All questions and concerns have been addressed.  She verbalizes understanding and agrees to the plan.  Disposition: Status is: Inpatient Remains inpatient appropriate because: Discharge in AM  Planned Discharge Destination: Home    Time spent: 50 minutes  Author: Aimee Somerset, MD 11/11/2024 2:48 PM  For on call review [email or web address].  "

## 2024-11-11 NOTE — Progress Notes (Signed)
 [facility]    SUBJECTIVE: Patient states to be doing much better post PCI and stent to RCA patient also had a altered mental status episode which is resolved no evidence of CVA.  Denies any chest pain or shortness of breath   Vitals:   11/11/24 0603 11/11/24 0700 11/11/24 0800 11/11/24 0805  BP: (!) 153/72   131/73  Pulse: 74 63 64 64  Resp: 17 13 13 16   Temp:    98.4 F (36.9 C)  TempSrc:    Oral  SpO2: 100% 99% 100% 100%  Weight:      Height:         Intake/Output Summary (Last 24 hours) at 11/11/2024 1040 Last data filed at 11/11/2024 0800 Gross per 24 hour  Intake 1897.17 ml  Output 2050 ml  Net -152.83 ml      PHYSICAL EXAM  General: Well developed, well nourished, in no acute distress HEENT:  Normocephalic and atramatic Neck:  No JVD.  Lungs: Clear bilaterally to auscultation and percussion. Heart: HRRR . Normal S1 and S2 without gallops or murmurs.  Abdomen: Bowel sounds are positive, abdomen soft and non-tender  Msk:  Back normal, normal gait. Normal strength and tone for age. Extremities: No clubbing, cyanosis or edema.   Neuro: Alert and oriented X 3. Psych:  Good affect, responds appropriately   LABS: Basic Metabolic Panel: Recent Labs    11/08/24 2342 11/09/24 0330 11/10/24 0438 11/10/24 1747  NA 140 143 141  --   K 4.7 3.3* 3.8 3.2*  CL 105 109 108  --   CO2 17* 20* 23  --   GLUCOSE 214* 72 85  --   BUN 20 17 9   --   CREATININE 1.32* 0.94 0.79  --   CALCIUM  8.6* 8.5* 8.4*  --   MG 1.9  --   --  1.4*   Liver Function Tests: Recent Labs    11/08/24 2342 11/09/24 0330  AST 305* 322*  ALT 268* 240*  ALKPHOS 101 86  BILITOT 0.7 0.4  PROT 6.9 6.3*  ALBUMIN 4.0 3.7   No results for input(s): LIPASE, AMYLASE in the last 72 hours. CBC: Recent Labs    11/08/24 2342 11/09/24 0330 11/10/24 0438  WBC 5.9 6.3 4.8  NEUTROABS 3.8  --   --   HGB 11.2* 10.5* 10.9*  HCT 34.3* 31.1* 33.3*  MCV 84.7 83.6 83.9  PLT 188 167 180    Cardiac Enzymes: No results for input(s): CKTOTAL, CKMB, CKMBINDEX, TROPONINI in the last 72 hours. BNP: Invalid input(s): POCBNP D-Dimer: Recent Labs    11/08/24 2342  DDIMER 1.16*   Hemoglobin A1C: Recent Labs    11/10/24 0742  HGBA1C 6.0*   Fasting Lipid Panel: No results for input(s): CHOL, HDL, LDLCALC, TRIG, CHOLHDL, LDLDIRECT in the last 72 hours. Thyroid Function Tests: Recent Labs    11/08/24 2342  TSH 1.670   Anemia Panel: No results for input(s): VITAMINB12, FOLATE, FERRITIN, TIBC, IRON, RETICCTPCT in the last 72 hours.  CT ANGIO HEAD NECK W WO CM W PERF (CODE STROKE) Result Date: 11/10/2024 EXAM: CTA Head and Neck with Perfusion 11/10/2024 08:10:44 PM TECHNIQUE: CTA of the head and neck was performed without and with the administration of 100 mL of iohexol  (OMNIPAQUE ) 350 MG/ML injection. 3D postprocessing with multiplanar reconstructions and MIPs was performed to evaluate the vascular anatomy. Cerebral perfusion analysis using computed tomography with contrast administration, including post-processing of parametric maps with determination of cerebral blood flow, cerebral  blood volume, mean transit time and time-to-maximum. Automated exposure control, iterative reconstruction, and/or weight based adjustment of the mA/kV was utilized to reduce the radiation dose to as low as reasonably achievable. COMPARISON: None available CLINICAL HISTORY: Neuro deficit, acute, stroke suspected; AMS. FINDINGS: CTA NECK: AORTIC ARCH AND ARCH VESSELS: No dissection or arterial injury. No significant stenosis of the brachiocephalic or subclavian arteries. CERVICAL CAROTID ARTERIES: No dissection, arterial injury, or hemodynamically significant stenosis by NASCET criteria. CERVICAL VERTEBRAL ARTERIES: No dissection, arterial injury, or significant stenosis. LUNGS AND MEDIASTINUM: Unremarkable. SOFT TISSUES: No acute abnormality. BONES: No acute abnormality.  CTA HEAD: ANTERIOR CIRCULATION: No significant stenosis of the internal carotid arteries. No significant stenosis of the anterior cerebral arteries. No significant stenosis of the middle cerebral arteries. No aneurysm. POSTERIOR CIRCULATION: No significant stenosis of the posterior cerebral arteries. No significant stenosis of the basilar artery. No significant stenosis of the vertebral arteries. No aneurysm. OTHER: No dural venous sinus thrombosis on this non-dedicated study. CT PERFUSION: EXAM QUALITY: Exam quality is adequate with diagnostic perfusion maps. Appropriate arterial inflow and venous outflow curves. CORE INFARCT (CBF<30% volume): 0 mL TOTAL HYPOPERFUSION (Tmax>6s volume): 0 mL PENUMBRA: Mismatch volume: 0 mL Mismatch ratio: not applicable Location: not applicable IMPRESSION: 1. No acute large vessel occlusion. 2. No evidence of ischemia by CT brain perfusion. 3. No hemodynamically significant stenosis or aneurysm in the head or neck vessels. Electronically signed by: Franky Stanford MD 11/10/2024 08:21 PM EST RP Workstation: [id number]   DG Chest 1 View Result Date: 11/10/2024 EXAM: 1 VIEW XRAY OF THE CHEST 11/10/2024 06:54:51 PM COMPARISON: 11/08/2024 CLINICAL HISTORY: Shortness of breath. FINDINGS: LUNGS AND PLEURA: No focal pulmonary opacity. No pleural effusion. No pneumothorax. HEART AND MEDIASTINUM: No acute abnormality of the cardiac and mediastinal silhouettes. BONES AND SOFT TISSUES: No acute osseous abnormality. IMPRESSION: 1. No acute cardiopulmonary pathology. Electronically signed by: Franky Crease MD 11/10/2024 08:15 PM EST RP Workstation: [id number]   CT HEAD CODE STROKE WO CONTRAST Result Date: 11/10/2024 EXAM: CT HEAD WITHOUT CONTRAST 11/10/2024 06:22:40 PM TECHNIQUE: CT of the head was performed without the administration of intravenous contrast. Automated exposure control, iterative reconstruction, and/or weight based adjustment of the mA/kV was utilized to reduce the radiation  dose to as low as reasonably achievable. COMPARISON: 08/02/2013 CLINICAL HISTORY: Neuro deficit, acute, stroke suspected. Confusion following heart catheterization. FINDINGS: BRAIN AND VENTRICLES: No acute hemorrhage. Age indeterminate infarct is present in the right caudate head. Subcortical white matter hypoattenuation demonstrates some progression since the prior study. No hydrocephalus. No extra-axial collection. No mass effect or midline shift. ORBITS: No acute abnormality. SINUSES: No acute abnormality. SOFT TISSUES AND SKULL: No acute soft tissue abnormality. No skull fracture. [place] stroke program early CT score (aspects) ----- Ganglionic (caudate, ic, lentiform nucleus, insula, M1-m3): 6 Supraganglionic (m4-m6): 3 Total: 9 IMPRESSION: 1. 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 2. ASPECTS 9/10. 3. Progression of subcortical white matter hypoattenuation since the prior study. 4. These results were communicated to Dr. Matthews at 6:26 pm on 11/10/2024 by secure text page via the [facility] messaging system. Electronically signed by: Lonni Necessary MD 11/10/2024 06:33 PM EST RP Workstation: [id number]   ECHOCARDIOGRAM COMPLETE Result Date: 11/09/2024    ECHOCARDIOGRAM REPORT   Patient Name:   Chelsey Wilson Date of Exam: 11/09/2024 Medical Rec #:  [id number]    Height:       60.0 in Accession #:    [id number]   Weight:       134.9 lb  Date of Birth:  03-12-1966   BSA:          1.579 m Patient Age:    58 years     BP:           128/103 mmHg Patient Gender: F            HR:           83 bpm. Exam Location:  [facility] Procedure: 2D Echo, Cardiac Doppler and Color Doppler (Both Spectral and Color            Flow Doppler were utilized during procedure). Indications:     Elevated troponin  History:         Patient has prior history of Echocardiogram examinations, most                  recent 05/28/2016. Risk Factors:Hypertension and Dyslipidemia.                  Sepsis.  Sonographer:     Christopher Furnace Referring  Phys:  [id number] DELAYNE LULLA SOLIAN Diagnosing Phys: Cara JONETTA Lovelace MD IMPRESSIONS  1. Left ventricular ejection fraction, by estimation, is 20 to 25%. The left ventricle has severely decreased function. The left ventricle demonstrates global hypokinesis. The left ventricular internal cavity size was moderately to severely dilated. There is moderate asymmetric left ventricular hypertrophy of the inferior and basal segments. Left ventricular diastolic parameters are consistent with Grade II diastolic dysfunction (pseudonormalization).  2. Right ventricular systolic function is mildly reduced. The right ventricular size is mildly enlarged. Mildly increased right ventricular wall thickness.  3. Left atrial size was mildly dilated.  4. Right atrial size was mildly dilated.  5. The mitral valve is grossly normal. Severe mitral valve regurgitation.  6. The aortic valve is calcified. Aortic valve regurgitation is trivial. Aortic valve sclerosis/calcification is present, without any evidence of aortic stenosis. FINDINGS  Left Ventricle: Left ventricular ejection fraction, by estimation, is 20 to 25%. The left ventricle has severely decreased function. The left ventricle demonstrates global hypokinesis. Strain was performed and the global longitudinal strain is indeterminate. The left ventricular internal cavity size was moderately to severely dilated. There is moderate asymmetric left ventricular hypertrophy of the inferior and basal segments. Left ventricular diastolic parameters are consistent with Grade II diastolic dysfunction (pseudonormalization). Right Ventricle: The right ventricular size is mildly enlarged. Mildly increased right ventricular wall thickness. Right ventricular systolic function is mildly reduced. Left Atrium: Left atrial size was mildly dilated. Right Atrium: Right atrial size was mildly dilated. Pericardium: There is no evidence of pericardial effusion. Mitral Valve: The mitral valve is grossly normal.  Severe mitral valve regurgitation. MV peak gradient, 4.2 mmHg. The mean mitral valve gradient is 2.0 mmHg. Tricuspid Valve: The tricuspid valve is grossly normal. Tricuspid valve regurgitation is mild. Aortic Valve: The aortic valve is calcified. Aortic valve regurgitation is trivial. Aortic valve sclerosis/calcification is present, without any evidence of aortic stenosis. Aortic valve mean gradient measures 1.0 mmHg. Aortic valve peak gradient measures 3.0 mmHg. Aortic valve area, by VTI measures 3.06 cm. Pulmonic Valve: The pulmonic valve was not well visualized. Pulmonic valve regurgitation is not visualized. Aorta: The aortic root was not well visualized. IAS/Shunts: No atrial level shunt detected by color flow Doppler. Additional Comments: 3D was performed not requiring image post processing on an independent workstation and was indeterminate.  LEFT VENTRICLE PLAX 2D LVIDd:         5.50 cm  Diastology LVIDs:         5.10 cm      LV e' medial:    5.87 cm/s LV PW:         1.40 cm      LV E/e' medial:  19.4 LV IVS:        1.10 cm      LV e' lateral:   4.35 cm/s LVOT diam:     2.10 cm      LV E/e' lateral: 26.2 LV SV:         42 LV SV Index:   27 LVOT Area:     3.46 cm  LV Volumes (MOD) LV vol d, MOD A2C: 112.0 ml LV vol d, MOD A4C: 138.0 ml LV vol s, MOD A2C: 84.4 ml LV vol s, MOD A4C: 120.0 ml LV SV MOD A2C:     27.6 ml LV SV MOD A4C:     138.0 ml LV SV MOD BP:      22.0 ml RIGHT VENTRICLE RV Basal diam:  2.40 cm RV Mid diam:    1.70 cm LEFT ATRIUM             Index        RIGHT ATRIUM           Index LA diam:        4.20 cm 2.66 cm/m   RA Area:     10.60 cm LA Vol (A2C):   44.7 ml 28.31 ml/m  RA Volume:   20.50 ml  12.98 ml/m LA Vol (A4C):   49.2 ml 31.16 ml/m LA Biplane Vol: 48.7 ml 30.84 ml/m  AORTIC VALVE AV Area (Vmax):    2.72 cm AV Area (Vmean):   3.11 cm AV Area (VTI):     3.06 cm AV Vmax:           86.50 cm/s AV Vmean:          52.900 cm/s AV VTI:            0.137 m AV Peak Grad:      3.0  mmHg AV Mean Grad:      1.0 mmHg LVOT Vmax:         67.90 cm/s LVOT Vmean:        47.500 cm/s LVOT VTI:          0.121 m LVOT/AV VTI ratio: 0.88  AORTA Ao Root diam: 3.10 cm MITRAL VALVE MV Area (PHT): 5.09 cm      SHUNTS MV Area VTI:   1.67 cm      Systemic VTI:  0.12 m MV Peak grad:  4.2 mmHg      Systemic Diam: 2.10 cm MV Mean grad:  2.0 mmHg MV Vmax:       1.03 m/s MV Vmean:      71.1 cm/s MV Decel Time: 149 msec MR Peak grad:   131.3 mmHg MR Mean grad:   79.0 mmHg MR Vmax:        573.00 cm/s MR Vmean:       416.0 cm/s MR PISA:        6.28 cm MR PISA Radius: 1.00 cm MV E velocity: 114.00 cm/s MV A velocity: 87.50 cm/s MV E/A ratio:  1.30 Kelechi Orgeron D Ross Hefferan MD Electronically signed by Cara JONETTA Lovelace MD Signature Date/Time: 11/09/2024/5:29:15 PM    Final      Echo severely depressed left ventricular function EF 20 to 25% severe MR  TELEMETRY:  Normal sinus rhythm 65 nonspecific ST-T wave changes:  ASSESSMENT AND PLAN:  Principal Problem:   Narrow complex tachycardia, unstable, s/p DC cardioversion 11/08/2024 Active Problems:   Essential hypertension, benign   Fibromyalgia   CAD S/P percutaneous coronary angioplasty   Transaminitis   High anion gap metabolic acidosis   Chronic kidney disease, stage 3a (HCC)   Hyperglycemia   Acute encephalopathy    Plan Non-STEMI peak troponins of 1700 agree with aspirin  antiplatelet therapy for 12 months Coronary artery disease previous PCI and stent to circumflex status post PCI and stent to mid LAD with DES Dilated cardiomyopathy EF =20 to 25% worsening cardiomyopathy out of proportion to coronary disease GDMT recommend losartan  spironolactone  metoprolol  Doreen will have the patient follow-up with heart failure Continue Plavix prasugrel  for 12 months post non-STEMI post PCI. Increase activity ambulate in the halls Anticipate discharge within 23 hours Transaminitis possibly passive congestion follow-up with GI Follow-up with heart failure  evaluate severe mitral regurgitation Follow-up with cardiology 1 to 2 weeks   Cara JONETTA Lovelace, MD, 11/11/2024 10:40 AM

## 2024-11-11 NOTE — Plan of Care (Signed)
 Continuing with plan of care.

## 2024-11-11 NOTE — Progress Notes (Signed)
 Patient woke from sleep, attempting to get out of bed. RN in to redirect, putting legs back in bed and reminding of importance to stay in bed with lines/wires and IV tubing attached. Patient upset and attempting to stand, stating that we are holding her hostage and that she wants to leave the hospital now. Daughter, Bonnielee, called and had a conversation with the patient and RN on importance of monitoring, especially post heart cath, and also after the events that occurred yesterday post-procedure. RN explained to patient that the provider would be in to see her later on today, and it is important to call for assistance so we can stay safe and prevent falls. Patient assisted to use the [facility] with this RN and Ashleigh, NT, and safely returned to bed. Patient is alert + oriented x3, follows commands, and vitals are WNL at this time.  Arlean FORBES Bowers, RN

## 2024-11-12 ENCOUNTER — Other Ambulatory Visit: Payer: Self-pay

## 2024-11-12 LAB — BASIC METABOLIC PANEL WITH GFR
Anion gap: 12 (ref 5–15)
BUN: 12 mg/dL (ref 6–20)
CO2: 22 mmol/L (ref 22–32)
Calcium: 9.3 mg/dL (ref 8.9–10.3)
Chloride: 105 mmol/L (ref 98–111)
Creatinine, Ser: 0.96 mg/dL (ref 0.44–1.00)
GFR, Estimated: >60 mL/min
Glucose, Bld: 101 mg/dL — ABNORMAL HIGH (ref 70–99)
Potassium: 4.2 mmol/L (ref 3.5–5.1)
Sodium: 140 mmol/L (ref 135–145)

## 2024-11-12 LAB — GLUCOSE, CAPILLARY: Glucose-Capillary: 75 mg/dL (ref 70–99)

## 2024-11-12 MED ORDER — PRASUGREL HCL 10 MG PO TABS
10.0000 mg | ORAL_TABLET | Freq: Every day | ORAL | 1 refills | Status: AC
  Filled 2024-11-12: qty 30, 30d supply, fill #0

## 2024-11-12 MED ORDER — ASPIRIN 81 MG PO TBEC
81.0000 mg | DELAYED_RELEASE_TABLET | Freq: Every day | ORAL | 12 refills | Status: AC
  Filled 2024-11-12: qty 30, 30d supply, fill #0

## 2024-11-12 MED ORDER — LOSARTAN POTASSIUM 25 MG PO TABS
25.0000 mg | ORAL_TABLET | Freq: Every day | ORAL | 0 refills | Status: AC
  Filled 2024-11-12: qty 30, 30d supply, fill #0

## 2024-11-12 MED ORDER — SPIRONOLACTONE 25 MG PO TABS
12.5000 mg | ORAL_TABLET | Freq: Every day | ORAL | 0 refills | Status: AC
  Filled 2024-11-12: qty 15, 30d supply, fill #0

## 2024-11-12 MED ORDER — ROSUVASTATIN CALCIUM 20 MG PO TABS
20.0000 mg | ORAL_TABLET | Freq: Every day | ORAL | 0 refills | Status: AC
  Filled 2024-11-12: qty 30, 30d supply, fill #0

## 2024-11-12 MED ORDER — CARVEDILOL 6.25 MG PO TABS
6.2500 mg | ORAL_TABLET | Freq: Two times a day (BID) | ORAL | 0 refills | Status: AC
  Filled 2024-11-12: qty 60, 30d supply, fill #0

## 2024-11-12 NOTE — Plan of Care (Signed)
" °  Problem: Education: Goal: Ability to describe self-care measures that may prevent or decrease complications (Diabetes Survival Skills Education) will improve Outcome: Progressing   Problem: Coping: Goal: Ability to adjust to condition or change in health will improve Outcome: Progressing   Problem: Fluid Volume: Goal: Ability to maintain a balanced intake and output will improve Outcome: Progressing   Problem: Health Behavior/Discharge Planning: Goal: Ability to identify and utilize available resources and services will improve Outcome: Progressing Goal: Ability to manage health-related needs will improve Outcome: Progressing   Problem: Metabolic: Goal: Ability to maintain appropriate glucose levels will improve Outcome: Progressing   Problem: Nutritional: Goal: Maintenance of adequate nutrition will improve Outcome: Progressing Goal: Progress toward achieving an optimal weight will improve Outcome: Progressing   Problem: Skin Integrity: Goal: Risk for impaired skin integrity will decrease Outcome: Progressing   Problem: Tissue Perfusion: Goal: Adequacy of tissue perfusion will improve Outcome: Progressing   Problem: Education: Goal: Understanding of CV disease, CV risk reduction, and recovery process will improve Outcome: Progressing Goal: Individualized Educational Video(s) Outcome: Progressing   Problem: Activity: Goal: Ability to return to baseline activity level will improve Outcome: Progressing   Problem: Cardiovascular: Goal: Ability to achieve and maintain adequate cardiovascular perfusion will improve Outcome: Progressing Goal: Vascular access site(s) Level 0-1 will be maintained Outcome: Progressing   Problem: Health Behavior/Discharge Planning: Goal: Ability to safely manage health-related needs after discharge will improve Outcome: Progressing   Problem: Education: Goal: Knowledge of General Education information will improve Description:  Including pain rating scale, medication(s)/side effects and non-pharmacologic comfort measures Outcome: Progressing   Problem: Health Behavior/Discharge Planning: Goal: Ability to manage health-related needs will improve Outcome: Progressing   Problem: Clinical Measurements: Goal: Ability to maintain clinical measurements within normal limits will improve Outcome: Progressing Goal: Will remain free from infection Outcome: Progressing Goal: Diagnostic test results will improve Outcome: Progressing Goal: Respiratory complications will improve Outcome: Progressing Goal: Cardiovascular complication will be avoided Outcome: Progressing   Problem: Activity: Goal: Risk for activity intolerance will decrease Outcome: Progressing   Problem: Nutrition: Goal: Adequate nutrition will be maintained Outcome: Progressing   Problem: Coping: Goal: Level of anxiety will decrease Outcome: Progressing   Problem: Elimination: Goal: Will not experience complications related to bowel motility Outcome: Progressing Goal: Will not experience complications related to urinary retention Outcome: Progressing   Problem: Pain Managment: Goal: General experience of comfort will improve and/or be controlled Outcome: Progressing   Problem: Safety: Goal: Ability to remain free from injury will improve Outcome: Progressing   Problem: Skin Integrity: Goal: Risk for impaired skin integrity will decrease Outcome: Progressing   "

## 2024-11-12 NOTE — Discharge Summary (Signed)
 " Physician Discharge Summary   Patient: Chelsey Wilson MRN: [id number] DOB: [birth year]  Admit date:     11/08/2024  Discharge date: 11/12/24  Discharge Physician: Maryellen Dowdle   PCP: Carin Gauze, NP   Recommendations at discharge:      Discharge Diagnoses: Principal Problem:   Narrow complex tachycardia, unstable, s/p DC cardioversion 11/08/2024 Active Problems:   NSTEMI (non-ST elevated myocardial infarction) (HCC)   CAD S/P percutaneous coronary angioplasty   High anion gap metabolic acidosis   Hyperglycemia   Essential hypertension, benign   Transaminitis   Acute encephalopathy   Chronic kidney disease, stage 3a (HCC)   Fibromyalgia  Resolved Problems:   * No resolved hospital problems. *  Hospital Course:  Chelsey Wilson is a 59 y.o. female with medical history significant for CAD with history of NSTEMI s/p PCI, HTN, CKD stage llla, fibromyalgia being admitted following emergent DC cardioversion for unstable narrow complex tachyarrhythmia.  Patient who has a history of metoprolol  intolerance was seen earlier in the day by her PCP on 1/14 as an urgent visit for palpitations.  She was referred to cardiology, however later in the day she had ongoing symptoms going on to develop chest pain and shortness of breath prompting the visit to the ED. On arrival, she was tachycardic to 182 and hypotensive to 75/57 with EKG showing sinus tachycardia with short PR.  She was afebrile, tachypneic to 22 with O2 sat 98% on room air. She underwent DC cardioversion at 120 J converting to sinus rhythm at 94 with BP 128/85. Other workup in the ED significant for the following: - Troponin 40, proBNP 2017, D-dimer 1.16 - CBC unremarkable with baseline hemoglobin of 11.2. - CMP notable for Mild hyperglycemia of 214, creatinine 1.32 up from baseline of 1.15, transaminitis with AST/ALT of 305/268, anion gap of 18 and bicarb of 17 -Magnesium  1.9 and potassium 4.7 CTA PE protocol negative for PE but  showing extensive multivessel coronary artery calcification as well as mild atherosclerotic calcification of the thoracic aorta among other nonacute findings.   Patient was treated with LR bolus fentanyl  for pain Zofran  Admission requested      01/17 -events of last night noted.  Patient was said to have been restless and agitated post left heart cath and received Ativan  resulting in worsening mental status changes.  Code stroke was called and patient was assessed by neurology.  Initial CT scan of the head showed age indeterminate infarct in the right caudate head.  Patient was evaluated by neurology who recommended a CT angiogram of the head and neck.  She had a CT angiogram which did not show any LVO.   01/18: Patient is seen and examined at the bedside.  Feels better.  Awake, alert and oriented x 3.  Denies any chest pain or shortness of breath       Assessment and Plan:   Acute Metabolic Encephalopathy (Resolved) Acute CVA ruled out Most likely medication induced.  Patient had received benzodiazepine and fentanyl  prior to procedure and a dose of Ativan  postprocedure for agitation Patient appears to be back to her baseline mental status and is awake, alert and oriented x 3     * Narrow complex tachycardia, unstable, s/p DC cardioversion 11/08/2024 Patient presented to the ER for evaluation of chest pain and shortness of breath, she was tachycardic with heart rate in the 180s and systolic blood pressure in the 70s. Patient converted to sinus rhythm following electrical cardioversion Resolution in symptoms s/p cardioversion Appreciate  cardiology input Recommended to continue beta-blockers 2D echocardiogram was done and showed an LVEF of 20 to 25% with severely decreased LV function and global hypokinesis.  Grade 2 diastolic dysfunction Follow-up with cardiology as an outpatient     CAD S/P percutaneous coronary angioplasty NSTEMI Extensive multivessel coronary artery calcification  on CTA chest Ischemic cardiomyopathy (LVEF 20 to 25%) Patient presented with chest pain likely related to tachyarrhythmia, resolved post cardioversion Troponin peaked at 1732 and shows a downward trend Elevated troponin was initially thought to be secondary to demand ischemia from tachyarrhythmias but given patient's known history of coronary artery disease and left heart cath findings she appears to have had a non-ST elevation MI.   Patient is status post  left heart cath which showed an 80% RCA mid lesion, TIMI -3 flow  and is status post successful PCI and stenting of the mid RCA Patient is off heparin  drip Patient has been started on goal-directed medical therapy for ischemic cardiomyopathy with recent LVEF of 20 to 25% and is currently on carvedilol , spironolactone  and losartan .   Last 2D echocardiogram from 2017 showed an LVEF of 45%     Hyperglycemia At risk for diabetes mellitus No documented history of diabetes mellitus Episodes of hypoglycemia Hemoglobin A1c is 5.9 Patient advised to maintain a consistent carbohydrate diet Will need an A1c checked as an outpatient     High anion gap metabolic acidosis Lactic acidosis Suspect related to tissue hypoxia from hypotension caused by unstable tachyarrhythmia Resolved with IV fluid hydration     Transaminitis Possibly related to mild ischemia related to hypotension that was present on admission Right upper quadrant ultrasound showed mild gallbladder wall thickening, predominantly along the hepatic surface, measuring up to 4 mm.      Essential hypertension, benign Continue carvedilol  losartan  and spironolactone      Chronic kidney disease, stage 3a (HCC) Renal function near baseline     Fibromyalgia Continue amitriptyline  once verified           Consultants: Cardiology Procedures performed: Left heart cath Disposition: Home Diet recommendation:  Discharge Diet Orders (From admission, onward)     Start     Ordered    11/12/24 0000  Diet - low sodium heart healthy        11/12/24 1016   11/12/24 0000  Diet Carb Modified        11/12/24 1016           Cardiac and Carb modified diet DISCHARGE MEDICATION: Allergies as of 11/12/2024       Reactions   Metoprolol  Palpitations        Medication List     STOP taking these medications    amitriptyline  50 MG tablet Commonly known as: ELAVIL    pantoprazole  40 MG tablet Commonly known as: Protonix    Repatha  140 MG/ML Sosy Generic drug: Evolocumab        TAKE these medications    aspirin  EC 81 MG tablet Take 1 tablet (81 mg total) by mouth daily. Swallow whole. Start taking on: November 13, 2024 What changed: additional instructions   carvedilol  6.25 MG tablet Commonly known as: COREG  Take 1 tablet (6.25 mg total) by mouth 2 (two) times daily. What changed:  medication strength how much to take   losartan  25 MG tablet Commonly known as: COZAAR  Take 1 tablet (25 mg total) by mouth daily. Start taking on: November 13, 2024   prasugrel  10 MG Tabs tablet Commonly known as: EFFIENT  Take 1 tablet (10 mg total)  by mouth daily. Start taking on: November 13, 2024   rosuvastatin  20 MG tablet Commonly known as: CRESTOR  Take 1 tablet (20 mg total) by mouth daily. Start taking on: November 13, 2024   spironolactone  25 MG tablet Commonly known as: ALDACTONE  Take 0.5 tablets (12.5 mg total) by mouth daily. Start taking on: November 13, 2024        Follow-up Information     Florencio Kava D, MD. Go in 1 week(s).   Specialties: Cardiology, Internal Medicine Contact information: [address] [place] [address] [phone]         Carin Gauze, NP Follow up.   Specialty: Cardiology Contact information: [address] [place] [address] [phone]                Discharge Exam: Fredricka Weights   11/08/24 2257 11/10/24 2030  Weight: 61.2 kg 58.9 kg   Constitutional:      General: She is not  in acute distress. HENT:     Head: Normocephalic and atraumatic.  Cardiovascular:     Rate and Rhythm: Normal rate and regular rhythm.     Heart sounds: Normal heart sounds.  Pulmonary:     Effort: Pulmonary effort is normal.     Breath sounds: Normal breath sounds.  Abdominal:     Palpations: Abdomen is soft.     Tenderness: There is no abdominal tenderness.  Neurological:     Mental Status: Mental status is at baseline.     Condition at discharge: stable  The results of significant diagnostics from this hospitalization (including imaging, microbiology, ancillary and laboratory) are listed below for reference.   Imaging Studies: CT ANGIO HEAD NECK W WO CM W PERF (CODE STROKE) Result Date: 11/10/2024 EXAM: CTA Head and Neck with Perfusion 11/10/2024 08:10:44 PM TECHNIQUE: CTA of the head and neck was performed without and with the administration of 100 mL of iohexol  (OMNIPAQUE ) 350 MG/ML injection. 3D postprocessing with multiplanar reconstructions and MIPs was performed to evaluate the vascular anatomy. Cerebral perfusion analysis using computed tomography with contrast administration, including post-processing of parametric maps with determination of cerebral blood flow, cerebral blood volume, mean transit time and time-to-maximum. Automated exposure control, iterative reconstruction, and/or weight based adjustment of the mA/kV was utilized to reduce the radiation dose to as low as reasonably achievable. COMPARISON: None available CLINICAL HISTORY: Neuro deficit, acute, stroke suspected; AMS. FINDINGS: CTA NECK: AORTIC ARCH AND ARCH VESSELS: No dissection or arterial injury. No significant stenosis of the brachiocephalic or subclavian arteries. CERVICAL CAROTID ARTERIES: No dissection, arterial injury, or hemodynamically significant stenosis by NASCET criteria. CERVICAL VERTEBRAL ARTERIES: No dissection, arterial injury, or significant stenosis. LUNGS AND MEDIASTINUM: Unremarkable. SOFT  TISSUES: No acute abnormality. BONES: No acute abnormality. CTA HEAD: ANTERIOR CIRCULATION: No significant stenosis of the internal carotid arteries. No significant stenosis of the anterior cerebral arteries. No significant stenosis of the middle cerebral arteries. No aneurysm. POSTERIOR CIRCULATION: No significant stenosis of the posterior cerebral arteries. No significant stenosis of the basilar artery. No significant stenosis of the vertebral arteries. No aneurysm. OTHER: No dural venous sinus thrombosis on this non-dedicated study. CT PERFUSION: EXAM QUALITY: Exam quality is adequate with diagnostic perfusion maps. Appropriate arterial inflow and venous outflow curves. CORE INFARCT (CBF<30% volume): 0 mL TOTAL HYPOPERFUSION (Tmax>6s volume): 0 mL PENUMBRA: Mismatch volume: 0 mL Mismatch ratio: not applicable Location: not applicable IMPRESSION: 1. No acute large vessel occlusion. 2. No evidence of ischemia by CT brain perfusion. 3. No hemodynamically significant  stenosis or aneurysm in the head or neck vessels. Electronically signed by: Franky Stanford MD 11/10/2024 08:21 PM EST RP Workstation: [id number]   DG Chest 1 View Result Date: 11/10/2024 EXAM: 1 VIEW XRAY OF THE CHEST 11/10/2024 06:54:51 PM COMPARISON: 11/08/2024 CLINICAL HISTORY: Shortness of breath. FINDINGS: LUNGS AND PLEURA: No focal pulmonary opacity. No pleural effusion. No pneumothorax. HEART AND MEDIASTINUM: No acute abnormality of the cardiac and mediastinal silhouettes. BONES AND SOFT TISSUES: No acute osseous abnormality. IMPRESSION: 1. No acute cardiopulmonary pathology. Electronically signed by: Franky Crease MD 11/10/2024 08:15 PM EST RP Workstation: [id number]   CT HEAD CODE STROKE WO CONTRAST Result Date: 11/10/2024 EXAM: CT HEAD WITHOUT CONTRAST 11/10/2024 06:22:40 PM TECHNIQUE: CT of the head was performed without the administration of intravenous contrast. Automated exposure control, iterative reconstruction, and/or weight based  adjustment of the mA/kV was utilized to reduce the radiation dose to as low as reasonably achievable. COMPARISON: 08/02/2013 CLINICAL HISTORY: Neuro deficit, acute, stroke suspected. Confusion following heart catheterization. FINDINGS: BRAIN AND VENTRICLES: No acute hemorrhage. Age indeterminate infarct is present in the right caudate head. Subcortical white matter hypoattenuation demonstrates some progression since the prior study. No hydrocephalus. No extra-axial collection. No mass effect or midline shift. ORBITS: No acute abnormality. SINUSES: No acute abnormality. SOFT TISSUES AND SKULL: No acute soft tissue abnormality. No skull fracture. [place] stroke program early CT score (aspects) ----- Ganglionic (caudate, ic, lentiform nucleus, insula, M1-m3): 6 Supraganglionic (m4-m6): 3 Total: 9 IMPRESSION: 1. 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 2. ASPECTS 9/10. 3. Progression of subcortical white matter hypoattenuation since the prior study. 4. These results were communicated to Dr. Matthews at 6:26 pm on 11/10/2024 by secure text page via the [facility] messaging system. Electronically signed by: Lonni Necessary MD 11/10/2024 06:33 PM EST RP Workstation: [id number]   ECHOCARDIOGRAM COMPLETE Result Date: 11/09/2024    ECHOCARDIOGRAM REPORT   Patient Name:   OLUWATOMISIN HUSTEAD Date of Exam: 11/09/2024 Medical Rec #:  [id number]    Height:       60.0 in Accession #:    [id number]   Weight:       134.9 lb Date of Birth:  24-Sep-1966   BSA:          1.579 m Patient Age:    58 years     BP:           128/103 mmHg Patient Gender: F            HR:           83 bpm. Exam Location:  [facility] Procedure: 2D Echo, Cardiac Doppler and Color Doppler (Both Spectral and Color            Flow Doppler were utilized during procedure). Indications:     Elevated troponin  History:         Patient has prior history of Echocardiogram examinations, most                  recent 05/28/2016. Risk Factors:Hypertension and Dyslipidemia.                   Sepsis.  Sonographer:     Christopher Furnace Referring Phys:  [id number] DELAYNE LULLA SOLIAN Diagnosing Phys: Cara JONETTA Lovelace MD IMPRESSIONS  1. Left ventricular ejection fraction, by estimation, is 20 to 25%. The left ventricle has severely decreased function. The left ventricle demonstrates global hypokinesis. The left ventricular internal cavity size was moderately to severely dilated. There is moderate asymmetric  left ventricular hypertrophy of the inferior and basal segments. Left ventricular diastolic parameters are consistent with Grade II diastolic dysfunction (pseudonormalization).  2. Right ventricular systolic function is mildly reduced. The right ventricular size is mildly enlarged. Mildly increased right ventricular wall thickness.  3. Left atrial size was mildly dilated.  4. Right atrial size was mildly dilated.  5. The mitral valve is grossly normal. Severe mitral valve regurgitation.  6. The aortic valve is calcified. Aortic valve regurgitation is trivial. Aortic valve sclerosis/calcification is present, without any evidence of aortic stenosis. FINDINGS  Left Ventricle: Left ventricular ejection fraction, by estimation, is 20 to 25%. The left ventricle has severely decreased function. The left ventricle demonstrates global hypokinesis. Strain was performed and the global longitudinal strain is indeterminate. The left ventricular internal cavity size was moderately to severely dilated. There is moderate asymmetric left ventricular hypertrophy of the inferior and basal segments. Left ventricular diastolic parameters are consistent with Grade II diastolic dysfunction (pseudonormalization). Right Ventricle: The right ventricular size is mildly enlarged. Mildly increased right ventricular wall thickness. Right ventricular systolic function is mildly reduced. Left Atrium: Left atrial size was mildly dilated. Right Atrium: Right atrial size was mildly dilated. Pericardium: There is no evidence of pericardial  effusion. Mitral Valve: The mitral valve is grossly normal. Severe mitral valve regurgitation. MV peak gradient, 4.2 mmHg. The mean mitral valve gradient is 2.0 mmHg. Tricuspid Valve: The tricuspid valve is grossly normal. Tricuspid valve regurgitation is mild. Aortic Valve: The aortic valve is calcified. Aortic valve regurgitation is trivial. Aortic valve sclerosis/calcification is present, without any evidence of aortic stenosis. Aortic valve mean gradient measures 1.0 mmHg. Aortic valve peak gradient measures 3.0 mmHg. Aortic valve area, by VTI measures 3.06 cm. Pulmonic Valve: The pulmonic valve was not well visualized. Pulmonic valve regurgitation is not visualized. Aorta: The aortic root was not well visualized. IAS/Shunts: No atrial level shunt detected by color flow Doppler. Additional Comments: 3D was performed not requiring image post processing on an independent workstation and was indeterminate.  LEFT VENTRICLE PLAX 2D LVIDd:         5.50 cm      Diastology LVIDs:         5.10 cm      LV e' medial:    5.87 cm/s LV PW:         1.40 cm      LV E/e' medial:  19.4 LV IVS:        1.10 cm      LV e' lateral:   4.35 cm/s LVOT diam:     2.10 cm      LV E/e' lateral: 26.2 LV SV:         42 LV SV Index:   27 LVOT Area:     3.46 cm  LV Volumes (MOD) LV vol d, MOD A2C: 112.0 ml LV vol d, MOD A4C: 138.0 ml LV vol s, MOD A2C: 84.4 ml LV vol s, MOD A4C: 120.0 ml LV SV MOD A2C:     27.6 ml LV SV MOD A4C:     138.0 ml LV SV MOD BP:      22.0 ml RIGHT VENTRICLE RV Basal diam:  2.40 cm RV Mid diam:    1.70 cm LEFT ATRIUM             Index        RIGHT ATRIUM           Index LA diam:  4.20 cm 2.66 cm/m   RA Area:     10.60 cm LA Vol (A2C):   44.7 ml 28.31 ml/m  RA Volume:   20.50 ml  12.98 ml/m LA Vol (A4C):   49.2 ml 31.16 ml/m LA Biplane Vol: 48.7 ml 30.84 ml/m  AORTIC VALVE AV Area (Vmax):    2.72 cm AV Area (Vmean):   3.11 cm AV Area (VTI):     3.06 cm AV Vmax:           86.50 cm/s AV Vmean:           52.900 cm/s AV VTI:            0.137 m AV Peak Grad:      3.0 mmHg AV Mean Grad:      1.0 mmHg LVOT Vmax:         67.90 cm/s LVOT Vmean:        47.500 cm/s LVOT VTI:          0.121 m LVOT/AV VTI ratio: 0.88  AORTA Ao Root diam: 3.10 cm MITRAL VALVE MV Area (PHT): 5.09 cm      SHUNTS MV Area VTI:   1.67 cm      Systemic VTI:  0.12 m MV Peak grad:  4.2 mmHg      Systemic Diam: 2.10 cm MV Mean grad:  2.0 mmHg MV Vmax:       1.03 m/s MV Vmean:      71.1 cm/s MV Decel Time: 149 msec MR Peak grad:   131.3 mmHg MR Mean grad:   79.0 mmHg MR Vmax:        573.00 cm/s MR Vmean:       416.0 cm/s MR PISA:        6.28 cm MR PISA Radius: 1.00 cm MV E velocity: 114.00 cm/s MV A velocity: 87.50 cm/s MV E/A ratio:  1.30 Cara JONETTA Lovelace MD Electronically signed by Cara JONETTA Lovelace MD Signature Date/Time: 11/09/2024/5:29:15 PM    Final    US  Abdomen Limited RUQ (LIVER/GB) Result Date: 11/09/2024 EXAM: Right Upper Quadrant Abdominal Ultrasound 11/09/2024 08:49:06 AM TECHNIQUE: Real-time ultrasonography of the right upper quadrant of the abdomen was performed. COMPARISON: 08/03/2013 CLINICAL HISTORY: Transaminitis FINDINGS: LIVER: Normal echogenicity. No intrahepatic biliary ductal dilatation. No evidence of mass. Hepatopetal flow in the portal vein. BILIARY SYSTEM: Negative sonographic Murphy's sign. Mild wall thickening of the gallbladder, predominantly along the hepatic surface, measuring up to 4 mm. No cholelithiasis. No pericholecystic fluid. The common bile duct is within normal limits measuring 3.5 mm. No changes of acute cholecystitis. OTHER: No right upper quadrant ascites. IMPRESSION: 1. Mild gallbladder wall thickening, predominantly along the hepatic surface, measuring up to 4 mm. In the absence of gallstones, pericholecystic fluid, and a sonographic murphy's sign, this may be due to acute hepatitis, hypoproteinemia, or volume overload from either CHF or renal failure. Electronically signed by: Rogelia Myers MD  11/09/2024 09:00 AM EST RP Workstation: [id number]   CT Angio Chest PE W and/or Wo Contrast Result Date: 11/09/2024 EXAM: CTA CHEST 11/09/2024 01:36:21 AM TECHNIQUE: CTA of the chest was performed after the administration of intravenous contrast. Multiplanar reformatted images are provided for review. MIP images are provided for review. Automated exposure control, iterative reconstruction, and/or weight based adjustment of the mA/kV was utilized to reduce the radiation dose to as low as reasonably achievable. COMPARISON: None available. CLINICAL HISTORY: Pulmonary embolism (PE) high probability. Chest pain, dyspnea. FINDINGS: PULMONARY ARTERIES: Pulmonary arteries are  adequately opacified for evaluation. No acute pulmonary embolus. Main pulmonary artery is normal in caliber. MEDIASTINUM: Extensive multivessel coronary artery calcification. Global cardiac size is within normal limits though left ventricular dilation is noted. Mild atherosclerotic calcification within the thoracic aorta. LYMPH NODES: No mediastinal, hilar or axillary lymphadenopathy. LUNGS AND PLEURA: The lungs are without acute process. No focal consolidation or pulmonary edema. No evidence of pleural effusion or pneumothorax. UPPER ABDOMEN: Small hiatal hernia. SOFT TISSUES AND BONES: No acute bone or soft tissue abnormality. RAF SCORE: Aortic atherosclerosis (ICD-10: I70.0). No pulmonary embolism. IMPRESSION: 1. No pulmonary embolism. 2. Extensive multivessel coronary artery calcification and mild atherosclerotic calcification of the thoracic aorta. 3. Left ventricular dilation. 4. Small hiatal hernia. 5. RAF score includes aortic atherosclerosis (ICD10-I70.0) Electronically signed by: Dorethia Molt MD 11/09/2024 01:45 AM EST RP Workstation: [id number]   DG Chest 1 View Result Date: 11/08/2024 EXAM: 2 VIEW(S) XRAY OF THE CHEST 11/08/2024 11:29:00 PM COMPARISON: 04/01/2023 CLINICAL HISTORY: Chest pain. Pt reported chest pain shortness of  breath that began today. Pt denies cough or congestion. Pts only cardiac hx HTN. FINDINGS: LINES, TUBES AND DEVICES: Pacer pads on left chest. LUNGS AND PLEURA: No focal pulmonary opacity. No pleural effusion. No pneumothorax. HEART AND MEDIASTINUM: No acute abnormality of the cardiac and mediastinal silhouettes. BONES AND SOFT TISSUES: No acute osseous abnormality. IMPRESSION: 1. No acute cardiopulmonary process. Electronically signed by: Morgane Naveau MD 11/08/2024 11:32 PM EST RP Workstation: [id number]    Microbiology: Results for orders placed or performed during the hospital encounter of 11/08/24  Resp panel by RT-PCR (RSV, Flu A&B, Covid) Anterior Nasal Swab     Status: None   Collection Time: 11/08/24 11:49 PM   Specimen: Anterior Nasal Swab  Result Value Ref Range Status   SARS Coronavirus 2 by RT PCR NEGATIVE NEGATIVE Final    Comment: (NOTE) SARS-CoV-2 target nucleic acids are NOT DETECTED.  The SARS-CoV-2 RNA is generally detectable in upper respiratory specimens during the acute phase of infection. The lowest concentration of SARS-CoV-2 viral copies this assay can detect is 138 copies/mL. A negative result does not preclude SARS-Cov-2 infection and should not be used as the sole basis for treatment or other patient management decisions. A negative result may occur with  improper specimen collection/handling, submission of specimen other than nasopharyngeal swab, presence of viral mutation(s) within the areas targeted by this assay, and inadequate number of viral copies(<138 copies/mL). A negative result must be combined with clinical observations, patient history, and epidemiological information. The expected result is Negative.  Fact Sheet for Patients:  bloggercourse.com  Fact Sheet for Healthcare Providers:  seriousbroker.it  This test is no t yet approved or cleared by the [place]  FDA and  has been authorized  for detection and/or diagnosis of SARS-CoV-2 by FDA under an Emergency Use Authorization (EUA). This EUA will remain  in effect (meaning this test can be used) for the duration of the COVID-19 declaration under Section 564(b)(1) of the Act, 21 U.S.C.section 360bbb-3(b)(1), unless the authorization is terminated  or revoked sooner.       Influenza A by PCR NEGATIVE NEGATIVE Final   Influenza B by PCR NEGATIVE NEGATIVE Final    Comment: (NOTE) The Xpert Xpress SARS-CoV-2/FLU/RSV plus assay is intended as an aid in the diagnosis of influenza from Nasopharyngeal swab specimens and should not be used as a sole basis for treatment. Nasal washings and aspirates are unacceptable for Xpert Xpress SARS-CoV-2/FLU/RSV testing.  Fact Sheet for Patients: bloggercourse.com  Fact  Sheet for Healthcare Providers: seriousbroker.it  This test is not yet approved or cleared by the [place]  FDA and has been authorized for detection and/or diagnosis of SARS-CoV-2 by FDA under an Emergency Use Authorization (EUA). This EUA will remain in effect (meaning this test can be used) for the duration of the COVID-19 declaration under Section 564(b)(1) of the Act, 21 U.S.C. section 360bbb-3(b)(1), unless the authorization is terminated or revoked.     Resp Syncytial Virus by PCR NEGATIVE NEGATIVE Final    Comment: (NOTE) Fact Sheet for Patients: bloggercourse.com  Fact Sheet for Healthcare Providers: seriousbroker.it  This test is not yet approved or cleared by the [place]  FDA and has been authorized for detection and/or diagnosis of SARS-CoV-2 by FDA under an Emergency Use Authorization (EUA). This EUA will remain in effect (meaning this test can be used) for the duration of the COVID-19 declaration under Section 564(b)(1) of the Act, 21 U.S.C. section 360bbb-3(b)(1), unless the authorization is  terminated or revoked.  Performed at [facility], [address] [place], [place] [address]   Blood Culture (routine x 2)     Status: None (Preliminary result)   Collection Time: 11/08/24 11:49 PM   Specimen: BLOOD  Result Value Ref Range Status   Specimen Description BLOOD LEFT ANTECUBITAL  Final   Special Requests   Final    BOTTLES DRAWN AEROBIC AND ANAEROBIC Blood Culture results may not be optimal due to an inadequate volume of blood received in culture bottles   Culture   Final    NO GROWTH 3 DAYS Performed at [facility], [address], [place], [place] [address]    Report Status PENDING  Incomplete  Blood Culture (routine x 2)     Status: None (Preliminary result)   Collection Time: 11/09/24  3:30 AM   Specimen: BLOOD RIGHT ARM  Result Value Ref Range Status   Specimen Description BLOOD RIGHT ARM  Final   Special Requests   Final    BOTTLES DRAWN AEROBIC ONLY Blood Culture results may not be optimal due to an inadequate volume of blood received in culture bottles   Culture   Final    NO GROWTH 3 DAYS Performed at [facility], [address], [place], [place] [address]    Report Status PENDING  Incomplete  MRSA Next Gen by PCR, Nasal     Status: Abnormal   Collection Time: 11/10/24  8:33 PM   Specimen: Nasal Mucosa; Nasal Swab  Result Value Ref Range Status   MRSA by PCR Next Gen DETECTED (A) NOT DETECTED Final    Comment: RESULT CALLED TO, READ BACK BY AND VERIFIED WITH: ANNE FOGLEMAN, RN @004  11/11/2024 COP (NOTE) The GeneXpert MRSA Assay (FDA approved for NASAL specimens only), is one component of a comprehensive MRSA colonization surveillance program. It is not intended to diagnose MRSA infection nor to guide or monitor treatment for MRSA infections. Test performance is not FDA approved in patients less than 52 years old. Performed at [facility], [address] [place], [place] [address]     Labs: CBC: Recent  Labs  Lab 11/08/24 2342 11/09/24 0330 11/10/24 0438 11/11/24 1952  WBC 5.9 6.3 4.8 4.8  NEUTROABS 3.8  --   --   --   HGB 11.2* 10.5* 10.9* 11.6*  HCT 34.3* 31.1* 33.3* 34.3*  MCV 84.7 83.6 83.9 81.1  PLT 188 167 180 202   Basic Metabolic Panel: Recent Labs  Lab 11/08/24 2342 11/09/24  0330 11/10/24 0438 11/10/24 1747 11/11/24 1952 11/12/24 0410  NA 140 143 141  --  140 140  K 4.7 3.3* 3.8 3.2* 3.8 4.2  CL 105 109 108  --  105 105  CO2 17* 20* 23  --  24 22  GLUCOSE 214* 72 85  --  105* 101*  BUN 20 17 9   --  15 12  CREATININE 1.32* 0.94 0.79  --  0.98 0.96  CALCIUM  8.6* 8.5* 8.4*  --  9.2 9.3  MG 1.9  --   --  1.4*  --   --    Liver Function Tests: Recent Labs  Lab 11/08/24 2342 11/09/24 0330  AST 305* 322*  ALT 268* 240*  ALKPHOS 101 86  BILITOT 0.7 0.4  PROT 6.9 6.3*  ALBUMIN 4.0 3.7   CBG: Recent Labs  Lab 11/11/24 0734 11/11/24 1113 11/11/24 1605 11/11/24 1937 11/12/24 0821  GLUCAP 73 138* 105* 110* 75    Discharge time spent: Greater than 30 minutes.  Signed: Aimee Somerset, MD [facility] 11/12/2024 "

## 2024-11-12 NOTE — Progress Notes (Signed)
 [facility] Cardiology    SUBJECTIVE: Patient states he feels reasonably well with no significant complications status post cardiac cath and intervention yesterday possibly related to medications with change in mental status but was worked up for stroke and was negative.  Patient feels much improved today was able to walk around in the hall without difficulty   Vitals:   11/11/24 1800 11/11/24 2003 11/12/24 0413 11/12/24 0819  BP:  (!) 142/77 137/75 123/75  Pulse: 62 65 69 65  Resp: 17 18 18 16   Temp:  98.2 F (36.8 C) 97.9 F (36.6 C) 98.5 F (36.9 C)  TempSrc:  Oral    SpO2: 100% 100% 100% 99%  Weight:      Height:         Intake/Output Summary (Last 24 hours) at 11/12/2024 9167 Last data filed at 11/12/2024 0400 Gross per 24 hour  Intake 892.79 ml  Output --  Net 892.79 ml      PHYSICAL EXAM  General: Well developed, well nourished, in no acute distress HEENT:  Normocephalic and atramatic Neck:  No JVD.  Lungs: Clear bilaterally to auscultation and percussion. Heart: HRRR . Normal S1 and S2 without gallops or murmurs.  Abdomen: Bowel sounds are positive, abdomen soft and non-tender  Msk:  Back normal, normal gait. Normal strength and tone for age. Extremities: No clubbing, cyanosis or edema.   Neuro: Alert and oriented X 3. Psych:  Good affect, responds appropriately   LABS: Basic Metabolic Panel: Recent Labs    11/10/24 1747 11/11/24 1952 11/12/24 0410  NA  --  140 140  K 3.2* 3.8 4.2  CL  --  105 105  CO2  --  24 22  GLUCOSE  --  105* 101*  BUN  --  15 12  CREATININE  --  0.98 0.96  CALCIUM   --  9.2 9.3  MG 1.4*  --   --    Liver Function Tests: No results for input(s): AST, ALT, ALKPHOS, BILITOT, PROT, ALBUMIN in the last 72 hours. No results for input(s): LIPASE, AMYLASE in the last 72 hours. CBC: Recent Labs    11/10/24 0438 11/11/24 1952  WBC 4.8 4.8  HGB 10.9* 11.6*  HCT 33.3* 34.3*  MCV 83.9 81.1  PLT 180 202   Cardiac  Enzymes: No results for input(s): CKTOTAL, CKMB, CKMBINDEX, TROPONINI in the last 72 hours. BNP: Invalid input(s): POCBNP D-Dimer: No results for input(s): DDIMER in the last 72 hours. Hemoglobin A1C: Recent Labs    11/10/24 0742  HGBA1C 6.0*   Fasting Lipid Panel: No results for input(s): CHOL, HDL, LDLCALC, TRIG, CHOLHDL, LDLDIRECT in the last 72 hours. Thyroid Function Tests: No results for input(s): TSH, T4TOTAL, T3FREE, THYROIDAB in the last 72 hours.  Invalid input(s): FREET3 Anemia Panel: No results for input(s): VITAMINB12, FOLATE, FERRITIN, TIBC, IRON, RETICCTPCT in the last 72 hours.  CT ANGIO HEAD NECK W WO CM W PERF (CODE STROKE) Result Date: 11/10/2024 EXAM: CTA Head and Neck with Perfusion 11/10/2024 08:10:44 PM TECHNIQUE: CTA of the head and neck was performed without and with the administration of 100 mL of iohexol  (OMNIPAQUE ) 350 MG/ML injection. 3D postprocessing with multiplanar reconstructions and MIPs was performed to evaluate the vascular anatomy. Cerebral perfusion analysis using computed tomography with contrast administration, including post-processing of parametric maps with determination of cerebral blood flow, cerebral blood volume, mean transit time and time-to-maximum. Automated exposure control, iterative reconstruction, and/or weight based adjustment of the mA/kV was utilized to reduce the radiation  dose to as low as reasonably achievable. COMPARISON: None available CLINICAL HISTORY: Neuro deficit, acute, stroke suspected; AMS. FINDINGS: CTA NECK: AORTIC ARCH AND ARCH VESSELS: No dissection or arterial injury. No significant stenosis of the brachiocephalic or subclavian arteries. CERVICAL CAROTID ARTERIES: No dissection, arterial injury, or hemodynamically significant stenosis by NASCET criteria. CERVICAL VERTEBRAL ARTERIES: No dissection, arterial injury, or significant stenosis. LUNGS AND MEDIASTINUM: Unremarkable.  SOFT TISSUES: No acute abnormality. BONES: No acute abnormality. CTA HEAD: ANTERIOR CIRCULATION: No significant stenosis of the internal carotid arteries. No significant stenosis of the anterior cerebral arteries. No significant stenosis of the middle cerebral arteries. No aneurysm. POSTERIOR CIRCULATION: No significant stenosis of the posterior cerebral arteries. No significant stenosis of the basilar artery. No significant stenosis of the vertebral arteries. No aneurysm. OTHER: No dural venous sinus thrombosis on this non-dedicated study. CT PERFUSION: EXAM QUALITY: Exam quality is adequate with diagnostic perfusion maps. Appropriate arterial inflow and venous outflow curves. CORE INFARCT (CBF<30% volume): 0 mL TOTAL HYPOPERFUSION (Tmax>6s volume): 0 mL PENUMBRA: Mismatch volume: 0 mL Mismatch ratio: not applicable Location: not applicable IMPRESSION: 1. No acute large vessel occlusion. 2. No evidence of ischemia by CT brain perfusion. 3. No hemodynamically significant stenosis or aneurysm in the head or neck vessels. Electronically signed by: Franky Stanford MD 11/10/2024 08:21 PM EST RP Workstation: [id number]   DG Chest 1 View Result Date: 11/10/2024 EXAM: 1 VIEW XRAY OF THE CHEST 11/10/2024 06:54:51 PM COMPARISON: 11/08/2024 CLINICAL HISTORY: Shortness of breath. FINDINGS: LUNGS AND PLEURA: No focal pulmonary opacity. No pleural effusion. No pneumothorax. HEART AND MEDIASTINUM: No acute abnormality of the cardiac and mediastinal silhouettes. BONES AND SOFT TISSUES: No acute osseous abnormality. IMPRESSION: 1. No acute cardiopulmonary pathology. Electronically signed by: Franky Crease MD 11/10/2024 08:15 PM EST RP Workstation: [id number]   CT HEAD CODE STROKE WO CONTRAST Result Date: 11/10/2024 EXAM: CT HEAD WITHOUT CONTRAST 11/10/2024 06:22:40 PM TECHNIQUE: CT of the head was performed without the administration of intravenous contrast. Automated exposure control, iterative reconstruction, and/or weight based  adjustment of the mA/kV was utilized to reduce the radiation dose to as low as reasonably achievable. COMPARISON: 08/02/2013 CLINICAL HISTORY: Neuro deficit, acute, stroke suspected. Confusion following heart catheterization. FINDINGS: BRAIN AND VENTRICLES: No acute hemorrhage. Age indeterminate infarct is present in the right caudate head. Subcortical white matter hypoattenuation demonstrates some progression since the prior study. No hydrocephalus. No extra-axial collection. No mass effect or midline shift. ORBITS: No acute abnormality. SINUSES: No acute abnormality. SOFT TISSUES AND SKULL: No acute soft tissue abnormality. No skull fracture. [place] stroke program early CT score (aspects) ----- Ganglionic (caudate, ic, lentiform nucleus, insula, M1-m3): 6 Supraganglionic (m4-m6): 3 Total: 9 IMPRESSION: 1. 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 2. ASPECTS 9/10. 3. Progression of subcortical white matter hypoattenuation since the prior study. 4. These results were communicated to Dr. Matthews at 6:26 pm on 11/10/2024 by secure text page via the [facility] messaging system. Electronically signed by: Lonni Necessary MD 11/10/2024 06:33 PM EST RP Workstation: [id number]     Echo moderate severely reduced left ventricular function EF around 20 to 25%  TELEMETRY: Sinus rhythm rate of 65 nonspecific ST-T changes:  ASSESSMENT AND PLAN:  Principal Problem:   Narrow complex tachycardia, unstable, s/p DC cardioversion 11/08/2024 Active Problems:   Essential hypertension, benign   Fibromyalgia   CAD S/P percutaneous coronary angioplasty   Transaminitis   High anion gap metabolic acidosis   Chronic kidney disease, stage 3a (HCC)   Hyperglycemia  Acute encephalopathy    Plan Narrow complex tachycardia status post cardioversion recommend beta-blockade therapy consider antiarrhythmic refer to EP for further evaluation Non-STEMI elevated troponins found to have high-grade RCA lesion widely patent  stent in the circumflex and previously Status post PCI and stent of mid RCA with DES now on prasugrel  and aspirin  HFrEF new EF around 20-25 % recommend GDMT Coreg  spironolactone  losartan  consider adding SGLT2 High intensity statin LDL goal should be less than 70 Transaminitis unclear etiology consider having the patient follow-up with GI Chronic renal sufficiency CVA referred to nephrology for further assessment Recommend outpatient follow-up with cardiac rehab for non-STEMI status post PCI and stent Have the patient follow-up with cardiology 1 to 2 weeks   Cara JONETTA Lovelace, MD 11/12/2024 8:32 AM

## 2024-11-12 NOTE — Evaluation (Signed)
 Physical Therapy Evaluation Patient Details Name: Chelsey Wilson MRN: [id number] DOB: 1966/02/27 Today's Date: 11/12/2024  History of Present Illness  Pt is a 59 y/o F admitted on 11/08/24 after presenting with c/o chest pain & SOB. Pt is s/p emergent DC cardioversion for unstable narrow complex tachyarrhythmia on 11/08/24. Pt is s/p cardiac cath on 11/10/24. PMH: CAD, NSTEMI s/p PCI, HTN, CKD 3A, fibromyalgia  Clinical Impression  Pt seen for PT evaluation with pt agreeable to tx. Pt reports prior to admission she was independent without AD, working, driving. On this date, pt is able to ambulate around nurses station & negotiate stairs without rails with mod I. At this time, pt does not require acute PT services. PT to complete current orders; please re-consult if new needs arise.        If plan is discharge home, recommend the following:     Can travel by private vehicle        Equipment Recommendations None recommended by PT  Recommendations for Other Services       Functional Status Assessment Patient has not had a recent decline in their functional status     Precautions / Restrictions Precautions Precautions: None Restrictions Weight Bearing Restrictions Per Provider Order: No      Mobility  Bed Mobility               General bed mobility comments: not tested, pt received & left sitting EOB    Transfers Overall transfer level: Independent                      Ambulation/Gait Ambulation/Gait assistance: Independent Gait Distance (Feet):  (>150 ft) Assistive device: None Gait Pattern/deviations: Step-through pattern          Stairs Stairs: Yes Stairs assistance: Modified independent (Device/Increase time)   Number of Stairs: 6 (+8) General stair comments: slightly leans on rail when descending stairs  Wheelchair Mobility     Tilt Bed    Modified Rankin (Stroke Patients Only)       Balance     Sitting balance-Leahy Scale: Normal      Standing balance support: No upper extremity supported Standing balance-Leahy Scale: Normal                               Pertinent Vitals/Pain Pain Assessment Pain Assessment: No/denies pain    Home Living Family/patient expects to be discharged to:: Private residence Living Arrangements: Alone   Type of Home: Apartment Home Access: Stairs to enter                Prior Function               Mobility Comments: Independent, driving, working       Extremity/Trunk Assessment   Upper Extremity Assessment Upper Extremity Assessment: Overall WFL for tasks assessed    Lower Extremity Assessment Lower Extremity Assessment: Overall WFL for tasks assessed       Communication   Communication Communication: No apparent difficulties    Cognition Arousal: Alert Behavior During Therapy: WFL for tasks assessed/performed   PT - Cognitive impairments: No apparent impairments                         Following commands: Intact       Cueing Cueing Techniques: Verbal cues     General Comments General comments (skin integrity, edema, etc.): HR  106 bpm at end of session    Exercises     Assessment/Plan    PT Assessment Patient does not need any further PT services  PT Problem List         PT Treatment Interventions      PT Goals (Current goals can be found in the Care Plan section)  Acute Rehab PT Goals Patient Stated Goal: go home PT Goal Formulation: With patient Time For Goal Achievement: 11/26/24 Potential to Achieve Goals: Good    Frequency       Co-evaluation               AM-PAC PT 6 Clicks Mobility  Outcome Measure Help needed turning from your back to your side while in a flat bed without using bedrails?: None Help needed moving from lying on your back to sitting on the side of a flat bed without using bedrails?: None Help needed moving to and from a bed to a chair (including a wheelchair)?: None Help  needed standing up from a chair using your arms (e.g., wheelchair or bedside chair)?: None Help needed to walk in hospital room?: None Help needed climbing 3-5 steps with a railing? : None 6 Click Score: 24    End of Session   Activity Tolerance: Patient tolerated treatment well Patient left: in bed;with call bell/phone within reach Nurse Communication: Mobility status      Time: 1042-1050 PT Time Calculation (min) (ACUTE ONLY): 8 min   Charges:   PT Evaluation $PT Eval Low Complexity: 1 Low   PT General Charges $$ ACUTE PT VISIT: 1 Visit         Chelsey Wilson, PT, DPT 11/12/24, 10:55 AM   Chelsey Wilson 11/12/2024, 10:53 AM

## 2024-11-14 ENCOUNTER — Encounter: Payer: Self-pay | Admitting: Internal Medicine

## 2024-11-14 LAB — CULTURE, BLOOD (ROUTINE X 2)
Culture: NO GROWTH
Culture: NO GROWTH

## 2024-11-14 LAB — LIPOPROTEIN A (LPA): Lipoprotein (a): 238.5 nmol/L — ABNORMAL HIGH

## 2024-11-15 ENCOUNTER — Other Ambulatory Visit: Payer: Self-pay

## 2024-11-20 ENCOUNTER — Institutional Professional Consult (permissible substitution): Payer: Self-pay | Admitting: Cardiovascular Disease

## 2024-11-29 LAB — CARDIAC CATHETERIZATION: Cath EF Quantitative: 55 %

## 2024-12-06 ENCOUNTER — Ambulatory Visit: Payer: Self-pay | Admitting: Cardiology
